# Patient Record
Sex: Female | Born: 1990 | Race: Black or African American | Hispanic: No | Marital: Single | Smoking: Former smoker
Health system: Southern US, Community
[De-identification: ages and names within clinical notes are randomized; demographics above are authoritative.]

## PROBLEM LIST (undated history)

## (undated) ENCOUNTER — Emergency Department (HOSPITAL_BASED_OUTPATIENT_CLINIC_OR_DEPARTMENT_OTHER): Admission: EM | Source: Home / Self Care

## (undated) ENCOUNTER — Emergency Department (HOSPITAL_BASED_OUTPATIENT_CLINIC_OR_DEPARTMENT_OTHER)

## (undated) DIAGNOSIS — L309 Dermatitis, unspecified: Secondary | ICD-10-CM

## (undated) DIAGNOSIS — G4700 Insomnia, unspecified: Secondary | ICD-10-CM

## (undated) DIAGNOSIS — E0590 Thyrotoxicosis, unspecified without thyrotoxic crisis or storm: Secondary | ICD-10-CM

## (undated) DIAGNOSIS — L509 Urticaria, unspecified: Secondary | ICD-10-CM

## (undated) DIAGNOSIS — R002 Palpitations: Secondary | ICD-10-CM

## (undated) DIAGNOSIS — D649 Anemia, unspecified: Secondary | ICD-10-CM

## (undated) DIAGNOSIS — J069 Acute upper respiratory infection, unspecified: Secondary | ICD-10-CM

## (undated) DIAGNOSIS — E039 Hypothyroidism, unspecified: Secondary | ICD-10-CM

## (undated) DIAGNOSIS — J45909 Unspecified asthma, uncomplicated: Secondary | ICD-10-CM

## (undated) DIAGNOSIS — J40 Bronchitis, not specified as acute or chronic: Secondary | ICD-10-CM

## (undated) DIAGNOSIS — F988 Other specified behavioral and emotional disorders with onset usually occurring in childhood and adolescence: Secondary | ICD-10-CM

## (undated) DIAGNOSIS — G43909 Migraine, unspecified, not intractable, without status migrainosus: Secondary | ICD-10-CM

## (undated) HISTORY — DX: Unspecified asthma, uncomplicated: J45.909

## (undated) HISTORY — PX: ADENOIDECTOMY: SUR15

## (undated) HISTORY — DX: Acute upper respiratory infection, unspecified: J06.9

## (undated) HISTORY — PX: SINOSCOPY: SHX187

## (undated) HISTORY — DX: Dermatitis, unspecified: L30.9

## (undated) HISTORY — DX: Insomnia, unspecified: G47.00

## (undated) HISTORY — DX: Hypothyroidism, unspecified: E03.9

## (undated) HISTORY — DX: Bronchitis, not specified as acute or chronic: J40

## (undated) HISTORY — PX: VAGINA RECONSTRUCTION SURGERY: SHX828

## (undated) HISTORY — DX: Migraine, unspecified, not intractable, without status migrainosus: G43.909

## (undated) HISTORY — DX: Urticaria, unspecified: L50.9

## (undated) HISTORY — DX: Other specified behavioral and emotional disorders with onset usually occurring in childhood and adolescence: F98.8

## (undated) HISTORY — PX: TONSILLECTOMY: SUR1361

---

## 2015-11-25 HISTORY — PX: NASAL SEPTOPLASTY W/ TURBINOPLASTY: SHX2070

## 2018-09-01 ENCOUNTER — Ambulatory Visit: Payer: Self-pay | Admitting: Allergy and Immunology

## 2018-09-01 ENCOUNTER — Encounter: Payer: Self-pay | Admitting: Allergy and Immunology

## 2018-09-01 ENCOUNTER — Ambulatory Visit (INDEPENDENT_AMBULATORY_CARE_PROVIDER_SITE_OTHER): Payer: Medicaid Other | Admitting: Allergy and Immunology

## 2018-09-01 VITALS — BP 104/68 | HR 86 | Temp 98.6°F | Resp 16 | Ht 67.5 in | Wt 120.0 lb

## 2018-09-01 DIAGNOSIS — J3089 Other allergic rhinitis: Secondary | ICD-10-CM | POA: Diagnosis not present

## 2018-09-01 DIAGNOSIS — H6983 Other specified disorders of Eustachian tube, bilateral: Secondary | ICD-10-CM | POA: Diagnosis not present

## 2018-09-01 DIAGNOSIS — G43909 Migraine, unspecified, not intractable, without status migrainosus: Secondary | ICD-10-CM

## 2018-09-01 DIAGNOSIS — R011 Cardiac murmur, unspecified: Secondary | ICD-10-CM

## 2018-09-01 DIAGNOSIS — R079 Chest pain, unspecified: Secondary | ICD-10-CM | POA: Diagnosis not present

## 2018-09-01 DIAGNOSIS — J4540 Moderate persistent asthma, uncomplicated: Secondary | ICD-10-CM

## 2018-09-01 DIAGNOSIS — K219 Gastro-esophageal reflux disease without esophagitis: Secondary | ICD-10-CM

## 2018-09-01 DIAGNOSIS — G479 Sleep disorder, unspecified: Secondary | ICD-10-CM

## 2018-09-01 MED ORDER — FLUTICASONE PROPIONATE 50 MCG/ACT NA SUSP: 1.0000 | Freq: Two times a day (BID) | NASAL | 5 refills | Status: DC

## 2018-09-01 MED ORDER — OMEPRAZOLE 40 MG PO CPDR: 40.0000 mg | DELAYED_RELEASE_CAPSULE | Freq: Every day | ORAL | 5 refills | Status: DC

## 2018-09-01 MED ORDER — CYPROHEPTADINE HCL 4 MG PO TABS: ORAL_TABLET | ORAL | 5 refills | Status: DC

## 2018-09-01 NOTE — Patient Instructions (Addendum)
  1.  Allergen avoidance measures  2.  Treat and prevent inflammation:   A.  Continue Symbicort 160 - 2 inhalations twice a day  B.  Flonase - 1 spray each nostril twice a day  C.  Remain away from any tobacco smoke exposure  3.  Treat and prevent reflux:   A.  Minimize any caffeine or chocolate consumption  B.  OTC omeprazole 40 mg - 1 tablet 1 time per day  4.  Treat and prevent insomnia and headaches;   A.  Periactin 4 mg tablet - 1/2 to 1 tablet 1 time at bedtime  5.  If needed:   A.  Albuterol MDI 2 inhalations every 4-6 hours  B.  Nasal saline  C.  Cetirizine 10 mg - 1 tablet 1 time per day  6.  Is Topamax helping you at all?  Is this medication necessary?  Is gabapentin causing a memory issue?  7.  Review recent chest x-ray and blood test results  8.  Further evaluation for murmur?  9.  Return to clinic in 3 weeks or earlier if problem  10.  Plan for fall flu vaccine

## 2018-09-01 NOTE — Progress Notes (Signed)
Dear Dr. Marina Goodell,  Thank you for referring Carla [name] to the [facility] [place] on 09/01/2018.   Below is a summation of this patient's evaluation and recommendations.  Thank you for your referral. I will keep you informed about this patient's response to treatment.   If you have any questions please do not hesitate to contact me.   Sincerely,  Jessica Priest, MD Allergy / Immunology [facility]   ______________________________________________________________________    NEW PATIENT NOTE  Referring Provider: Abigail Miyamoto,* Primary Provider: Abigail Miyamoto, MD Date of office visit: 09/01/2018    Subjective:   Chief Complaint:  Carla [name] (DOB: 10/01/1991) is a 27 y.o. female who presents to the clinic on 09/01/2018 with a chief complaint of Mold Allergy .     HPI: Carla [name] presents to this clinic in evaluation of many different issues.  First, she has been having a cough.  It sounds as though she started to develop respiratory tract symptoms in 2016 which she believes was secondary to mold exposure in a mold infested house which she left in 2017.  Even though that exposure has been eliminated she continues to cough and she has developed a rather significant cough for the past 10 months which has really gotten bad in the past 4 months.  In addition she has developed issues with chest tightness and having pain in her chest.  She describes 2 types of pains.  One pain is located at the lower ribs and the other is in the sternal area.  She has the lower rib pain every day.  She has the sternal chest pain which is very sharp a few times a week.  When she exercises she gets very out of breath.  Not only does she get out of breath she gets a little bit lightheaded and actually develops some dizziness and her chest starts to hurt.  There is no obvious provoking factors  giving rise to this issue.  She was recently started on Symbicort by a local pulmonologist about a month ago as well as Zyrtec and this may have resolved some of her cough but really did not help any of her other issues.  Second, she has constant throat clearing and something stuck in her throat and intermittent raspy voice.  She has a dry patch in her throat.  This has also been a persistent issue for many years.  She does not really note much symptomatology consistent with classic reflux.  Occasionally she will have mid chest burning if she eats the wrong food which is just a few times a month.  Third, she does have issues with nasal congestion and postnasal drip and nose blowing on occasion.  She has had septoplasty and turbinate reduction performed by an ENT doctor about 2 years ago which may have helped her somewhat but she still continues to have these persistent symptoms.  She states that she is treated with antibiotics about every other month and has been receiving this frequency of antibiotics since 2016.  It does not appear as though any nasal steroids or nasal antihistamines or other medications has help this issue very much at all.  Once again there is no obvious provoking factor giving rise to this issue.  Fourth, she does have issues with ear pain.  She will have intermittent ear pain mostly involving the right but occasionally left ear.  She will  get intermittent ringing as well.  Apparently her evaluation with ENT did not identify any significant abnormality giving rise to this issue.  Fifth, she has daily headaches.  She wakes up with a headache and she goes to bed with a headache.  Her headache is located in the bridge of her nose in the back of her head and her temples.  It is mostly just a dull ache but occasionally can assume a pounding quality and is not associated with any scotoma.  She has seen a neurologist in the past and she has been placed on Topamax for the past several years  which has not altered her headache intensity or frequency at home.  Apparently she also has restless leg syndrome and was given gabapentin which she uses every night.  She had some type of sleep study performed by the neurologist which apparently identified primary insomnia.  Sixth, she has problems with sleep.  She has very bad insomnia and basically sleeps 4 hours per night and has very fractured sleep during those 4 hours.  She has been given trazodone in the past but this makes her headaches worse.  She does not really drink any caffeine.  Seventh, she did smoke up until 2 weeks ago.  She smoked from the age of [age] and then restarted about 4-1/2 years ago at a rate of 1 to 2 packs/week.  Eighth, she has been having problems with memory.  She had to drop out of college because of this issue.  Normally she was a 4.0 student but over the course of the past year or so she has had problems with memory.  It should be noted that this memory issue does appear to correlate with the use of her gabapentin for her restless leg syndrome.  Ninth, she has lost about 30 pounds of weight in the past year or so without a voluntary effort.  She has stabilized at her current weight of 120 pounds for the past several months.  She has a few other medical issues that are active such as right neck pain and spine pain for which she takes Naprosyn every day.  Past Medical History:  Diagnosis Date  . ADD (attention deficit disorder)   . Asthma   . Bronchitis   . Eczema   . Hypothyroidism   . Insomnia   . Migraine   . Recurrent upper respiratory infection (URI)   . Urticaria     Past Surgical History:  Procedure Laterality Date  . ADENOIDECTOMY    . NASAL SEPTOPLASTY W/ TURBINOPLASTY  2017  . SINOSCOPY    . TONSILLECTOMY      Allergies as of 09/01/2018      Reactions   Latex Hives      Medication List      albuterol 108 (90 Base) MCG/ACT inhaler Commonly known as:  PROVENTIL HFA;VENTOLIN  HFA Inhale 2 puffs into the lungs every 6 (six) hours as needed for wheezing or shortness of breath.   budesonide-formoterol 160-4.5 MCG/ACT inhaler Commonly known as:  SYMBICORT Inhale 2 puffs into the lungs 2 (two) times daily.   cetirizine 10 MG tablet Commonly known as:  ZYRTEC Take 10 mg by mouth daily.   gabapentin 600 MG tablet Commonly known as:  NEURONTIN Take 600 mg by mouth daily.   tizanidine 6 MG capsule Commonly known as:  ZANAFLEX Take 6 mg by mouth daily as needed for muscle spasms.   topiramate 50 MG tablet Commonly known as:  TOPAMAX Take  50 mg by mouth daily.       Review of systems negative except as noted in HPI / PMHx or noted below:  Review of Systems  Constitutional: Negative.   HENT: Negative.   Eyes: Negative.   Respiratory: Negative.   Cardiovascular: Negative.   Gastrointestinal: Negative.   Genitourinary: Negative.   Musculoskeletal: Negative.   Skin: Negative.   Neurological: Negative.   Endo/Heme/Allergies: Negative.   Psychiatric/Behavioral: Negative.     Family History  Problem Relation Age of Onset  . Hypertension Mother   . Hypertension Father     Social History   Socioeconomic History  . Marital status: Single    Spouse name: Not on file  . Number of children: Not on file  . Years of education: Not on file  . Highest education level: Not on file  Occupational History  . Not on file  Social Needs  . Financial resource strain: Not on file  . Food insecurity:    Worry: Not on file    Inability: Not on file  . Transportation needs:    Medical: Not on file    Non-medical: Not on file  Tobacco Use  . Smoking status: Former Smoker    Packs/day: 0.25    Years: 4.00    Pack years: 1.00    Types: Cigarettes    Last attempt to quit: 08/18/2018    Years since quitting: 0.0  . Smokeless tobacco: Never Used  . Tobacco comment: patient quit 2 weeks ago. 09-01-18  Substance and Sexual Activity  . Alcohol use: Never     Frequency: Never  . Drug use: Never  . Sexual activity: Not on file  Lifestyle  . Physical activity:    Days per week: Not on file    Minutes per session: Not on file  . Stress: Not on file  Relationships  . Social connections:    Talks on phone: Not on file    Gets together: Not on file    Attends religious service: Not on file    Active member of club or organization: Not on file    Attends meetings of clubs or organizations: Not on file    Relationship status: Not on file  . Intimate partner violence:    Fear of current or ex partner: Not on file    Emotionally abused: Not on file    Physically abused: Not on file    Forced sexual activity: Not on file  Other Topics Concern  . Not on file  Social History Narrative  . Not on file    Environmental and Social history  Lives in a apartment with a dry environment, a dog located inside the household, carpet in the bedroom, plastic on the bed, no plastic on the pillow, no smokers located inside the household employment as a Pharmacologist.    Objective:   Vitals:   09/01/18 1423  BP: 104/68  Pulse: 86  Resp: 16  Temp: 98.6 F (37 C)  SpO2: 99%   Height: 5' 7.5" (171.5 cm) Weight: 120 lb (54.4 kg)  Physical Exam  HENT:  Head: Normocephalic. Head is without right periorbital erythema and without left periorbital erythema.  Right Ear: Tympanic membrane, external ear and ear canal normal.  Left Ear: Tympanic membrane, external ear and ear canal normal.  Nose: Nose normal. No mucosal edema or rhinorrhea.  Mouth/Throat: Oropharynx is clear and moist and mucous membranes are normal. No oropharyngeal exudate.  Eyes: Pupils are equal, round,  and reactive to light. Conjunctivae and lids are normal.  Neck: Trachea normal. No tracheal deviation present. No thyromegaly present.  Cardiovascular: Normal rate, regular rhythm, S1 normal and S2 normal.  Murmur (Systolic) heard. Pulmonary/Chest: Effort normal. No stridor. No  respiratory distress. She has no wheezes. She has no rales. She exhibits no tenderness.  Abdominal: Soft. She exhibits no distension and no mass. There is no hepatosplenomegaly. There is no tenderness. There is no rebound and no guarding.  Musculoskeletal: She exhibits no edema or tenderness.  Lymphadenopathy:       Head (right side): No tonsillar adenopathy present.       Head (left side): No tonsillar adenopathy present.    She has no cervical adenopathy.    She has no axillary adenopathy.  Neurological: She is alert.  Skin: No rash noted. She is not diaphoretic. No erythema. No pallor. Nails show no clubbing.    Diagnostics: Allergy skin tests were performed.  She demonstrated hypersensitivity to house dust mite, tree pollen, and mold.  Spirometry was performed and demonstrated an FEV1 of 2.54 @ 81 % of predicted. FEV1/FVC = 0.74.  Following administration of nebulized albuterol her FEV1 rose to 2.92 which was an increase in the FEV1 of 15%.  Oxygen saturation was 98% on room air at rest.  Oxygen saturation at room air walking up and down the hallway was 97%  Assessment and Plan:    1. Not well controlled moderate persistent asthma   2. Perennial allergic rhinitis   3. ETD (Eustachian tube dysfunction), bilateral   4. Chest pain in adult   5. LPRD (laryngopharyngeal reflux disease)   6. Systolic murmur   7. Migraine syndrome   8. Sleep disorder     1.  Allergen avoidance measures  2.  Treat and prevent inflammation:   A.  Continue Symbicort 160 - 2 inhalations twice a day  B.  Flonase - 1 spray each nostril twice a day  C.  Remain away from any tobacco smoke exposure  3.  Treat and prevent reflux:   A.  Minimize any caffeine or chocolate consumption  B.  OTC omeprazole 40 mg - 1 tablet 1 time per day  4.  Treat and prevent insomnia and headaches;   A.  Periactin 4 mg tablet - 1/2 to 1 tablet 1 time at bedtime  5.  If needed:   A.  Albuterol MDI 2 inhalations every  4-6 hours  B.  Nasal saline  C.  Cetirizine 10 mg - 1 tablet 1 time per day  6.  Is Topamax helping you at all?  Is this medication necessary?  Is gabapentin causing a memory issue?  7.  Review recent chest x-ray and blood test results  8.  Further evaluation for murmur?  9.  Return to clinic in 3 weeks or earlier if problem  10.  Plan for fall flu vaccine  There is a lot going on with Carla [name].  All of her respiratory tract symptoms and chest pain may be tied up with her atopic disease and reflux disease and hopefully she will respond to the therapy noted above to address these issues.  However, we need to consider other cardiopulmonary abnormalities and immune abnormalities that may be contributing to her issue.  She has a body habitus very consistent with Marfan's and the presence of her murmur may be a manifestation of this condition.  I think it would be best for her to obtain a echocardiogram and investigation of  her murmur but without any insurance it is going to be very difficult for her to find the financial resources needed to perform this study.  Apparently she will be starting a new job soon and as soon as she does get insurance we can order a echocardiogram.  Baxter Hire also has headaches and sleep dysfunction.  I have started her on Periactin as a preventative for both headaches and also to treat her insomnia.  I question whether or not Topamax is an indicated medication at this point it as it has not really helped her very much at all regarding prevention of headaches.  As well, she appears to have developed a memory issue that does correlate with the use of gabapentin and I question whether or not gabapentin should be utilized if it is responsible for this side effect.  We will readdress all of these issues when she returns to this clinic in 3 weeks or earlier if there is a problem.  Jessica Priest, MD Allergy / Immunology [facility] [place]

## 2018-09-02 ENCOUNTER — Encounter: Payer: Self-pay | Admitting: Allergy and Immunology

## 2018-09-02 NOTE — Addendum Note (Signed)
Addended by: Jessica Priest on: 09/02/2018 03:48 PM   Modules accepted: Level of Service

## 2018-09-03 ENCOUNTER — Encounter: Payer: Self-pay | Admitting: Allergy and Immunology

## 2018-11-04 DIAGNOSIS — R0602 Shortness of breath: Secondary | ICD-10-CM

## 2019-11-23 DIAGNOSIS — N898 Other specified noninflammatory disorders of vagina: Secondary | ICD-10-CM | POA: Insufficient documentation

## 2020-08-08 ENCOUNTER — Encounter (HOSPITAL_COMMUNITY): Payer: Self-pay | Admitting: *Deleted

## 2020-08-08 ENCOUNTER — Emergency Department (HOSPITAL_COMMUNITY)
Admission: EM | Admit: 2020-08-08 | Discharge: 2020-08-08 | Disposition: A | Discharge status: Home / Self Care | Payer: Medicaid Other | Attending: Emergency Medicine | Admitting: Emergency Medicine

## 2020-08-08 ENCOUNTER — Other Ambulatory Visit: Payer: Self-pay

## 2020-08-08 DIAGNOSIS — R55 Syncope and collapse: Secondary | ICD-10-CM | POA: Diagnosis not present

## 2020-08-08 DIAGNOSIS — Z5321 Procedure and treatment not carried out due to patient leaving prior to being seen by health care provider: Secondary | ICD-10-CM | POA: Diagnosis not present

## 2020-08-08 DIAGNOSIS — W19XXXA Unspecified fall, initial encounter: Secondary | ICD-10-CM | POA: Diagnosis not present

## 2020-08-08 DIAGNOSIS — R519 Headache, unspecified: Secondary | ICD-10-CM | POA: Diagnosis not present

## 2020-08-08 DIAGNOSIS — R110 Nausea: Secondary | ICD-10-CM | POA: Diagnosis present

## 2020-08-08 NOTE — ED Notes (Signed)
Pt called for recheck vitals, no response

## 2020-08-08 NOTE — ED Triage Notes (Signed)
Pt reports having syncopal episode two weeks ago, she fell and hit her head at that time. Her dr sent her here due to still having a headache and nausea since hitting her head. No distress is noted at triage.

## 2020-08-08 NOTE — ED Notes (Signed)
Called and no response

## 2020-09-19 ENCOUNTER — Encounter: Payer: Self-pay | Admitting: *Deleted

## 2020-09-19 ENCOUNTER — Other Ambulatory Visit: Payer: Self-pay | Admitting: *Deleted

## 2020-09-19 DIAGNOSIS — R55 Syncope and collapse: Secondary | ICD-10-CM

## 2020-09-19 NOTE — Progress Notes (Signed)
Patient ID: Carla [name], female   DOB: [birth year], 29 y.o.   MRN: [id number] Patient enrolled for [employer] to ship a 30 day cardiac event monitor to her home.

## 2020-09-26 ENCOUNTER — Encounter (INDEPENDENT_AMBULATORY_CARE_PROVIDER_SITE_OTHER): Payer: Medicaid Other

## 2020-09-26 DIAGNOSIS — R55 Syncope and collapse: Secondary | ICD-10-CM | POA: Diagnosis not present

## 2020-11-14 ENCOUNTER — Ambulatory Visit: Payer: Medicaid Other | Admitting: Family Medicine

## 2020-11-24 NOTE — L&D Delivery Note (Signed)
Delivery Note At 5:35 PM a viable and healthy female was delivered via Vaginal, Spontaneous (Presentation:  LOA    ).  APGAR: 9, 9; weight 5 lb 1.3 oz (2305 g).   Placenta status: Spontaneous, Intact.  Cord: 3 vessels with the following complications: None.    Anesthesia: None Episiotomy: None Lacerations: None Suture Repair:  n/a Est. Blood Loss (mL): 50  Mom to postpartum.  Baby to Couplet care / Skin to Skin.   Carla [name] is a 30 y.o. female [phone] with IUP at [redacted]w[redacted]d admitted for IOL for DFM and FGR .  She progressed with augmentation to complete and pushed less than 10 minutes to deliver.  Cord clamping delayed by 1-3 minutes then clamped by CNM and cut by FOB.  Placenta intact and spontaneous, bleeding minimal.  Intact perineum.  Mom and baby stable prior to transfer to postpartum. She plans on breastfeeding. She requests POPs for birth control.   Sharen Counter 07/04/2021, 7:25 PM

## 2020-11-26 ENCOUNTER — Telehealth: Payer: Self-pay

## 2020-11-26 ENCOUNTER — Inpatient Hospital Stay (HOSPITAL_COMMUNITY)
Admission: AD | Admit: 2020-11-26 | Discharge: 2020-11-26 | Disposition: A | Discharge status: Home / Self Care | Payer: Medicaid Other | Attending: Family Medicine | Admitting: Family Medicine

## 2020-11-26 ENCOUNTER — Encounter (HOSPITAL_COMMUNITY): Payer: Self-pay | Admitting: Family Medicine

## 2020-11-26 ENCOUNTER — Inpatient Hospital Stay (HOSPITAL_COMMUNITY): Payer: Medicaid Other

## 2020-11-26 ENCOUNTER — Other Ambulatory Visit: Payer: Self-pay

## 2020-11-26 DIAGNOSIS — Z87891 Personal history of nicotine dependence: Secondary | ICD-10-CM | POA: Insufficient documentation

## 2020-11-26 DIAGNOSIS — Z3A01 Less than 8 weeks gestation of pregnancy: Secondary | ICD-10-CM | POA: Diagnosis not present

## 2020-11-26 DIAGNOSIS — O99611 Diseases of the digestive system complicating pregnancy, first trimester: Secondary | ICD-10-CM | POA: Diagnosis not present

## 2020-11-26 DIAGNOSIS — R1032 Left lower quadrant pain: Secondary | ICD-10-CM | POA: Diagnosis present

## 2020-11-26 DIAGNOSIS — O26891 Other specified pregnancy related conditions, first trimester: Secondary | ICD-10-CM | POA: Insufficient documentation

## 2020-11-26 DIAGNOSIS — R109 Unspecified abdominal pain: Secondary | ICD-10-CM | POA: Diagnosis not present

## 2020-11-26 DIAGNOSIS — O26899 Other specified pregnancy related conditions, unspecified trimester: Secondary | ICD-10-CM

## 2020-11-26 DIAGNOSIS — K5900 Constipation, unspecified: Secondary | ICD-10-CM

## 2020-11-26 DIAGNOSIS — Z3490 Encounter for supervision of normal pregnancy, unspecified, unspecified trimester: Secondary | ICD-10-CM

## 2020-11-26 LAB — COMPREHENSIVE METABOLIC PANEL
ALT: 15 U/L (ref 0–44)
AST: 18 U/L (ref 15–41)
Albumin: 3.6 g/dL (ref 3.5–5.0)
Alkaline Phosphatase: 42 U/L (ref 38–126)
Anion gap: 10 (ref 5–15)
BUN: 8 mg/dL (ref 6–20)
CO2: 24 mmol/L (ref 22–32)
Calcium: 9.5 mg/dL (ref 8.9–10.3)
Chloride: 103 mmol/L (ref 98–111)
Creatinine, Ser: 0.73 mg/dL (ref 0.44–1.00)
GFR, Estimated: >60 mL/min (ref >60)
Glucose, Bld: 98 mg/dL (ref 70–99)
Potassium: 4.8 mmol/L (ref 3.5–5.1)
Sodium: 137 mmol/L (ref 135–145)
Total Bilirubin: 0.7 mg/dL (ref 0.3–1.2)
Total Protein: 6.2 g/dL — ABNORMAL LOW (ref 6.5–8.1)

## 2020-11-26 LAB — URINALYSIS, ROUTINE W REFLEX MICROSCOPIC
Bilirubin Urine: NEGATIVE
Glucose, UA: NEGATIVE mg/dL
Hgb urine dipstick: NEGATIVE
Ketones, ur: NEGATIVE mg/dL
Leukocytes,Ua: NEGATIVE
Nitrite: NEGATIVE
Protein, ur: NEGATIVE mg/dL
Specific Gravity, Urine: 1.005 (ref 1.005–1.030)
pH: 7 (ref 5.0–8.0)

## 2020-11-26 LAB — CBC WITH DIFFERENTIAL/PLATELET
Abs Immature Granulocytes: 0.02 10*3/uL (ref 0.00–0.07)
Basophils Absolute: 0 10*3/uL (ref 0.0–0.1)
Basophils Relative: 1 %
Eosinophils Absolute: 0.1 10*3/uL (ref 0.0–0.5)
Eosinophils Relative: 1 %
HCT: 34.1 % — ABNORMAL LOW (ref 36.0–46.0)
Hemoglobin: 11.6 g/dL — ABNORMAL LOW (ref 12.0–15.0)
Immature Granulocytes: 0 %
Lymphocytes Relative: 35 %
Lymphs Abs: 1.7 10*3/uL (ref 0.7–4.0)
MCH: 29.8 pg (ref 26.0–34.0)
MCHC: 34 g/dL (ref 30.0–36.0)
MCV: 87.7 fL (ref 80.0–100.0)
Monocytes Absolute: 0.5 10*3/uL (ref 0.1–1.0)
Monocytes Relative: 10 %
Neutro Abs: 2.6 10*3/uL (ref 1.7–7.7)
Neutrophils Relative %: 53 %
Platelets: 236 10*3/uL (ref 150–400)
RBC: 3.89 MIL/uL (ref 3.87–5.11)
RDW: 13.9 % (ref 11.5–15.5)
WBC: 4.9 10*3/uL (ref 4.0–10.5)
nRBC: 0 % (ref 0.0–0.2)

## 2020-11-26 LAB — HIV ANTIBODY (ROUTINE TESTING W REFLEX): HIV Screen 4th Generation wRfx: NONREACTIVE

## 2020-11-26 LAB — ABO/RH: ABO/RH(D): O POS

## 2020-11-26 LAB — WET PREP, GENITAL
Clue Cells Wet Prep HPF POC: NONE SEEN
Sperm: NONE SEEN
Trich, Wet Prep: NONE SEEN
Yeast Wet Prep HPF POC: NONE SEEN

## 2020-11-26 LAB — POCT PREGNANCY, URINE: Preg Test, Ur: POSITIVE — AB

## 2020-11-26 LAB — HCG, QUANTITATIVE, PREGNANCY: hCG, Beta Chain, Quant, S: 12214 m[IU]/mL — ABNORMAL HIGH (ref <5)

## 2020-11-26 NOTE — MAU Note (Signed)
Started pain in LL pelvis last night. Has been constipated for 2 days. Tried metamucil, having trouble keeping it down.  Doesn't know how far a long she is, irreg cycles.  First +HPT 2nd wk in Dec.

## 2020-11-26 NOTE — Telephone Encounter (Signed)
Pt called stating she is having some sharp stabbing pains and constipation. Pt does not know how far along she is in pregnancy and she denies vaginal bleeding. Pt made aware that she can take metamucil or try some colace and to increase her water intake. I advised pt to go to [facility] if the pain gets worse and if she starts bleeding heavy like a period. Understanding was voiced. Carla [name] l Daniya Aramburo, CMA

## 2020-11-26 NOTE — MAU Provider Note (Signed)
History     CSN: [id number]  Arrival date and time: 11/26/20 1316   Event Date/Time   First Provider Initiated Contact with Patient 11/26/20 1406      Chief Complaint  Patient presents with  . Pelvic Pain  . Possible Pregnancy  . Constipation   HPI     Ms.Carla [name] is a 30 y.o.female [id number] unknown gestation presents to MAU with complaints of LLQ pain, lower back pain, ? Constipation, N/V.  Reports having constipation since she found out she was pregnant in December. She had a normal BM 3 days ago and nothing since. Reports she occasionally has the urge to use the bathroom without relief. She reports the pain comes and goes. She has tried tylenol which has not helped. She denies bleeding. Occasional N/v which she associates with early pregnancy. She reports normal diet and able to tolerate/ keep down oral fluids.   OB History    Gravida  5   Para  2   Term  2   Preterm      AB  2   Living  2     SAB  1   IAB  1   Ectopic      Multiple      Live Births  2           Past Medical History:  Diagnosis Date  . ADD (attention deficit disorder)   . Asthma   . Bronchitis   . Eczema   . Hypothyroidism   . Insomnia   . Migraine   . Recurrent upper respiratory infection (URI)   . Urticaria     Past Surgical History:  Procedure Laterality Date  . ADENOIDECTOMY    . NASAL SEPTOPLASTY W/ TURBINOPLASTY  2017  . SINOSCOPY    . TONSILLECTOMY      Family History  Problem Relation Age of Onset  . Hypertension Mother   . Hypertension Father     Social History   Tobacco Use  . Smoking status: Former Smoker    Packs/day: 0.25    Years: 4.00    Pack years: 1.00    Types: Cigarettes    Quit date: 08/18/2018    Years since quitting: 2.2  . Smokeless tobacco: Never Used  . Tobacco comment: patient quit 2 weeks ago. 09-01-18  Vaping Use  . Vaping Use: Never used  Substance Use Topics  . Alcohol use: Never  . Drug use: Never     Allergies:  Allergies  Allergen Reactions  . Latex Hives    Medications Prior to Admission  Medication Sig Dispense Refill Last Dose  . Ascorbic Acid (VITAMIN C) 1000 MG tablet Take 1,000 mg by mouth daily.   11/26/2020 at Unknown time  . cholecalciferol (VITAMIN D3) 25 MCG (1000 UNIT) tablet Take 1,000 Units by mouth daily.   11/26/2020 at Unknown time  . Prenatal Vit-Fe Fumarate-FA (PRENATAL MULTIVITAMIN) TABS tablet Take 1 tablet by mouth daily at 12 noon.   11/26/2020 at Unknown time  . albuterol (PROVENTIL HFA;VENTOLIN HFA) 108 (90 Base) MCG/ACT inhaler Inhale 2 puffs into the lungs every 6 (six) hours as needed for wheezing or shortness of breath.   More than a month at Unknown time  . budesonide-formoterol (SYMBICORT) 160-4.5 MCG/ACT inhaler Inhale 2 puffs into the lungs 2 (two) times daily.   More than a month at Unknown time  . cetirizine (ZYRTEC) 10 MG tablet Take 10 mg by mouth daily.   More than a month  at Unknown time  . cyproheptadine (PERIACTIN) 4 MG tablet 0.5-1 tablet once at bedtime. 30 tablet 5   . fluticasone (FLONASE) 50 MCG/ACT nasal spray Place 1 spray into both nostrils 2 (two) times daily. 16 g 5   . gabapentin (NEURONTIN) 600 MG tablet Take 600 mg by mouth daily.   More than a month at Unknown time  . omeprazole (PRILOSEC) 40 MG capsule Take 1 capsule (40 mg total) by mouth daily. 30 capsule 5   . tizanidine (ZANAFLEX) 6 MG capsule Take 6 mg by mouth daily as needed for muscle spasms.     Marland Kitchen topiramate (TOPAMAX) 50 MG tablet Take 50 mg by mouth daily.   More than a month at Unknown time   Results for orders placed or performed during the hospital encounter of 11/26/20 (from the past 48 hour(s))  Urinalysis, Routine w reflex microscopic     Status: Abnormal   Collection Time: 11/26/20  1:37 PM  Result Value Ref Range   Color, Urine STRAW (A) YELLOW   APPearance CLEAR CLEAR   Specific Gravity, Urine 1.005 1.005 - 1.030   pH 7.0 5.0 - 8.0   Glucose, UA NEGATIVE  NEGATIVE mg/dL   Hgb urine dipstick NEGATIVE NEGATIVE   Bilirubin Urine NEGATIVE NEGATIVE   Ketones, ur NEGATIVE NEGATIVE mg/dL   Protein, ur NEGATIVE NEGATIVE mg/dL   Nitrite NEGATIVE NEGATIVE   Leukocytes,Ua NEGATIVE NEGATIVE    Comment: Performed at [facility], [address], [place], [place] [address]  Pregnancy, urine POC     Status: Abnormal   Collection Time: 11/26/20  1:42 PM  Result Value Ref Range   Preg Test, Ur POSITIVE (A) NEGATIVE    Comment:        THE SENSITIVITY OF THIS METHODOLOGY IS >24 mIU/mL   CBC with Differential/Platelet     Status: Abnormal   Collection Time: 11/26/20  2:26 PM  Result Value Ref Range   WBC 4.9 4.0 - 10.5 K/uL   RBC 3.89 3.87 - 5.11 MIL/uL   Hemoglobin 11.6 (L) 12.0 - 15.0 g/dL   HCT 50.5 (L) 39.7 - 67.3 %   MCV 87.7 80.0 - 100.0 fL   MCH 29.8 26.0 - 34.0 pg   MCHC 34.0 30.0 - 36.0 g/dL   RDW 41.9 37.9 - 02.4 %   Platelets 236 150 - 400 K/uL   nRBC 0.0 0.0 - 0.2 %   Neutrophils Relative % 53 %   Neutro Abs 2.6 1.7 - 7.7 K/uL   Lymphocytes Relative 35 %   Lymphs Abs 1.7 0.7 - 4.0 K/uL   Monocytes Relative 10 %   Monocytes Absolute 0.5 0.1 - 1.0 K/uL   Eosinophils Relative 1 %   Eosinophils Absolute 0.1 0.0 - 0.5 K/uL   Basophils Relative 1 %   Basophils Absolute 0.0 0.0 - 0.1 K/uL   Immature Granulocytes 0 %   Abs Immature Granulocytes 0.02 0.00 - 0.07 K/uL    Comment: Performed at [facility], [address], [place], [place] [address]  Comprehensive metabolic panel     Status: Abnormal   Collection Time: 11/26/20  2:26 PM  Result Value Ref Range   Sodium 137 135 - 145 mmol/L   Potassium 4.8 3.5 - 5.1 mmol/L   Chloride 103 98 - 111 mmol/L   CO2 24 22 - 32 mmol/L   Glucose, Bld 98 70 - 99 mg/dL    Comment: Glucose reference range applies only to samples  taken after fasting for at least 8 hours.   BUN 8 6 - 20 mg/dL   Creatinine, Ser 0.73 0.44 - 1.00 mg/dL   Calcium 9.5 8.9 - 10.3 mg/dL   Total Protein 6.2  (L) 6.5 - 8.1 g/dL   Albumin 3.6 3.5 - 5.0 g/dL   AST 18 15 - 41 U/L   ALT 15 0 - 44 U/L   Alkaline Phosphatase 42 38 - 126 U/L   Total Bilirubin 0.7 0.3 - 1.2 mg/dL   GFR, Estimated >60 >60 mL/min    Comment: (NOTE) Calculated using the CKD-EPI Creatinine Equation (2021)    Anion gap 10 5 - 15    Comment: Performed at [facility] [address], [place], [place] [address]  ABO/Rh     Status: None   Collection Time: 11/26/20  2:26 PM  Result Value Ref Range   ABO/RH(D) O POS    No rh immune globuloin      NOT A RH IMMUNE GLOBULIN CANDIDATE, PT RH POSITIVE Performed at [facility] [address] [place], [place] [address]   Wet prep, genital     Status: Abnormal   Collection Time: 11/26/20  2:30 PM   Specimen: Vaginal  Result Value Ref Range   Yeast Wet Prep HPF POC NONE SEEN NONE SEEN   Trich, Wet Prep NONE SEEN NONE SEEN   Clue Cells Wet Prep HPF POC NONE SEEN NONE SEEN   WBC, Wet Prep HPF POC FEW (A) NONE SEEN   Sperm NONE SEEN     Comment: Performed at [facility], [place] [address] [place], [place] [address]   US OB LESS THAN 14 WEEKS WITH OB TRANSVAGINAL  Result Date: 11/26/2020 CLINICAL DATA:  First trimester of pregnancy, left lower quadrant abdominal pain. EXAM: OBSTETRIC <14 WK [place] AND TRANSVAGINAL OB US TECHNIQUE: Both transabdominal and transvaginal ultrasound examinations were performed for complete evaluation of the gestation as well as the maternal uterus, adnexal regions, and pelvic cul-de-sac. Transvaginal technique was performed to assess early pregnancy. COMPARISON:  None. FINDINGS: Intrauterine gestational sac: Single Yolk sac:  Visualized. Embryo:  Not Visualized. Cardiac Activity: Not Visualized. MSD: 8.8 mm   5 w   4 d Subchorionic hemorrhage:  None visualized. Maternal uterus/adnexae: Ovaries are unremarkable. Small amount of free fluid is noted which most likely is physiologic. IMPRESSION: Probable early intrauterine gestational sac with  yolk sac, but no fetal pole or cardiac activity yet visualized. Recommend follow-up quantitative B-HCG levels and follow-up US in 14 days to assess viability. This recommendation follows SRU consensus guidelines: Diagnostic Criteria for Nonviable Pregnancy Early in the First Trimester. [employer] Med 2013; 903:0092-33. Electronically Signed   By: Marijo Conception M.D.   On: 11/26/2020 15:14   Review of Systems  Constitutional: Negative for fever.  Gastrointestinal: Positive for constipation, nausea and vomiting.  Genitourinary: Negative for vaginal bleeding and vaginal discharge.   Physical Exam   Blood pressure 113/73, pulse 83, temperature 98.7 F (37.1 C), temperature source Oral, resp. rate 17, height 5\' 9"  (1.753 m), weight 67.1 kg, SpO2 100 %, unknown if currently breastfeeding.  Physical Exam Vitals reviewed.  Constitutional:      General: She is not in acute distress.    Appearance: Normal appearance.  Pulmonary:     Effort: Pulmonary effort is normal.  Abdominal:     General: Bowel sounds are normal. There is no distension.     Palpations: Abdomen is  soft.     Tenderness: There is abdominal tenderness in the epigastric area and periumbilical area. There is no guarding or rebound.     Hernia: No hernia is present.  Musculoskeletal:        General: Normal range of motion.  Skin:    General: Skin is warm.  Neurological:     Mental Status: She is alert and oriented to person, place, and time.  Psychiatric:        Behavior: Behavior normal.    MAU Course  Procedures  None  MDM  O positive blood type  Wet prep & GC HIV, CBC, Hcg, ABO US OB transvaginal  Discussed [place] in detail with patient; discussed OTC medications for constipation. Offered enema here, patient declined, would like to perform at home.   Assessment and Plan   1. Pregnancy, unspecified gestational age   76. Abdominal pain affecting pregnancy   3. Constipation during pregnancy in first trimester      P:  Discharge home in stable condition List of OTC medications provided Continue oral hydration  Start prenatal care.  Return to MAU if symptoms worsen  Zeah Germano, Harolyn Rutherford, NP 11/26/2020 3:52 PM

## 2020-11-26 NOTE — MAU Note (Signed)
Pt uncomfortable when laying flat and tender in LLQ.

## 2020-11-26 NOTE — Discharge Instructions (Signed)
Safe Medications in Pregnancy  ? ? ?Acne: ?Benzoyl Peroxide ?Salicylic Acid ? ?Backache/Headache: ?Tylenol: 2 regular strength every 4 hours OR ?             2 Extra strength every 6 hours ? ?Colds/Coughs/Allergies: ?Benadryl (alcohol free) 25 mg every 6 hours as needed ?Breath right strips ?Claritin ?Cepacol throat lozenges ?Chloraseptic throat spray ?Cold-Eeze- up to three times per day ?Cough drops, alcohol free ?Flonase (by prescription only) ?Guaifenesin ?Mucinex ?Robitussin DM (plain only, alcohol free) ?Saline nasal spray/drops ?Sudafed (pseudoephedrine) & Actifed ** use only after [redacted] weeks gestation and if you do not have high blood pressure ?Tylenol ?Vicks Vaporub ?Zinc lozenges ?Zyrtec  ? ?Constipation: ?Colace ?Ducolax suppositories ?Fleet enema ?Glycerin suppositories ?Metamucil ?Milk of magnesia ?Miralax ?Senokot ?Smooth move tea ? ?Diarrhea: ?Kaopectate ?Imodium A-D ? ?*NO pepto Bismol ? ?Hemorrhoids: ?Anusol ?Anusol HC ?Preparation H ?Tucks ? ?Indigestion: ?Tums ?Maalox ?Mylanta ?Zantac  ?Pepcid ? ?Insomnia: ?Benadryl (alcohol free) 25mg every 6 hours as needed ?Tylenol PM ?Unisom, no Gelcaps ? ?Leg Cramps: ?Tums ?MagGel ? ?Nausea/Vomiting:  ?Bonine ?Dramamine ?Emetrol ?Ginger extract ?Sea bands ?Meclizine  ?Nausea medication to take during pregnancy:  ?Unisom (doxylamine succinate 25 mg tablets) Take one tablet daily at bedtime. If symptoms are not adequately controlled, the dose can be increased to a maximum recommended dose of two tablets daily (1/2 tablet in the morning, 1/2 tablet mid-afternoon and one at bedtime). ?Vitamin B6 100mg tablets. Take one tablet twice a day (up to 200 mg per day). ? ?Skin Rashes: ?Aveeno products ?Benadryl cream or 25mg every 6 hours as needed ?Calamine Lotion ?1% cortisone cream ? ?Yeast infection: ?Gyne-lotrimin 7 ?Monistat 7 ? ? ?**If taking multiple medications, please check labels to avoid duplicating the same active ingredients ?**take  medication as directed on the label ?** Do not exceed 4000 mg of tylenol in 24 hours ?**Do not take medications that contain aspirin or ibuprofen ? ? ? ?You have constipation which is hard stools that are difficult to pass. It is important to have regular bowel movements every 1-3 days that are soft and easy to pass. Hard stools increase your risk of hemorrhoids and are very uncomfortable.  ? ?To prevent constipation you can increase the amount of fiber in your diet. Examples of foods with fiber are leafy greens, whole grain breads, oatmeal and other grains.  It is also important to drink at least eight 8oz glass of water everyday.  ? ?If you have not has a bowel movement in 4-5 days you made need to clean out your bowel.  This will have establish normal movement through your bowel.   ? ?Miralax Clean out ?Take 8 capfuls of miralax in 64 oz of gatorade. You can use any fluid that appeals to you (gatorade, water, juice) ?Continue to drink at least eight 8 oz glasses of water throughout the day ?You can repeat with another 8 capfuls of miralax in 64 oz of gatorade if you are not having a large amount of stools ?You will need to be at home and close to a bathroom for about 8 hours when you do the above as you may need to go to the bathroom frequently.  ? ?After you are cleaned out: ?- Start Colace100mg twice daily ?- Start Miralax once daily ?- Start a daily fiber supplement like metamucil or citrucel ?- You can safely use enemas in pregnancy  ?- if you are having diarrhea you can reduce to Colace once a day or miralax every other   day or a 1/2 capful daily.  ? ?

## 2020-11-27 LAB — GC/CHLAMYDIA PROBE AMP (~~LOC~~) NOT AT ARMC
Chlamydia: NEGATIVE
Comment: NEGATIVE
Comment: NORMAL
Neisseria Gonorrhea: NEGATIVE

## 2020-12-13 ENCOUNTER — Encounter (HOSPITAL_COMMUNITY): Payer: Self-pay | Admitting: Obstetrics and Gynecology

## 2020-12-13 ENCOUNTER — Inpatient Hospital Stay (HOSPITAL_COMMUNITY)
Admission: AD | Admit: 2020-12-13 | Discharge: 2020-12-13 | Disposition: A | Discharge status: Home / Self Care | Payer: Medicaid Other | Attending: Obstetrics and Gynecology | Admitting: Obstetrics and Gynecology

## 2020-12-13 ENCOUNTER — Other Ambulatory Visit: Payer: Self-pay

## 2020-12-13 DIAGNOSIS — Z87891 Personal history of nicotine dependence: Secondary | ICD-10-CM | POA: Diagnosis not present

## 2020-12-13 DIAGNOSIS — O210 Mild hyperemesis gravidarum: Secondary | ICD-10-CM | POA: Insufficient documentation

## 2020-12-13 DIAGNOSIS — Z3A08 8 weeks gestation of pregnancy: Secondary | ICD-10-CM | POA: Diagnosis not present

## 2020-12-13 DIAGNOSIS — O99891 Other specified diseases and conditions complicating pregnancy: Secondary | ICD-10-CM | POA: Insufficient documentation

## 2020-12-13 DIAGNOSIS — K5901 Slow transit constipation: Secondary | ICD-10-CM | POA: Diagnosis present

## 2020-12-13 DIAGNOSIS — M5412 Radiculopathy, cervical region: Secondary | ICD-10-CM | POA: Diagnosis not present

## 2020-12-13 DIAGNOSIS — R109 Unspecified abdominal pain: Secondary | ICD-10-CM | POA: Diagnosis not present

## 2020-12-13 DIAGNOSIS — K5900 Constipation, unspecified: Secondary | ICD-10-CM | POA: Diagnosis present

## 2020-12-13 HISTORY — DX: Thyrotoxicosis, unspecified without thyrotoxic crisis or storm: E05.90

## 2020-12-13 LAB — URINALYSIS, ROUTINE W REFLEX MICROSCOPIC
Bilirubin Urine: NEGATIVE
Glucose, UA: NEGATIVE mg/dL
Hgb urine dipstick: NEGATIVE
Ketones, ur: NEGATIVE mg/dL
Leukocytes,Ua: NEGATIVE
Nitrite: NEGATIVE
Protein, ur: NEGATIVE mg/dL
Specific Gravity, Urine: 1.018 (ref 1.005–1.030)
pH: 8 (ref 5.0–8.0)

## 2020-12-13 LAB — CBC
HCT: 31.9 % — ABNORMAL LOW (ref 36.0–46.0)
Hemoglobin: 10.9 g/dL — ABNORMAL LOW (ref 12.0–15.0)
MCH: 30 pg (ref 26.0–34.0)
MCHC: 34.2 g/dL (ref 30.0–36.0)
MCV: 87.9 fL (ref 80.0–100.0)
Platelets: 207 10*3/uL (ref 150–400)
RBC: 3.63 MIL/uL — ABNORMAL LOW (ref 3.87–5.11)
RDW: 13.6 % (ref 11.5–15.5)
WBC: 5.2 10*3/uL (ref 4.0–10.5)
nRBC: 0 % (ref 0.0–0.2)

## 2020-12-13 LAB — COMPREHENSIVE METABOLIC PANEL
ALT: 13 U/L (ref 0–44)
AST: 16 U/L (ref 15–41)
Albumin: 3.6 g/dL (ref 3.5–5.0)
Alkaline Phosphatase: 33 U/L — ABNORMAL LOW (ref 38–126)
Anion gap: 8 (ref 5–15)
BUN: <5 mg/dL — ABNORMAL LOW (ref 6–20)
CO2: 24 mmol/L (ref 22–32)
Calcium: 9.1 mg/dL (ref 8.9–10.3)
Chloride: 107 mmol/L (ref 98–111)
Creatinine, Ser: 0.61 mg/dL (ref 0.44–1.00)
GFR, Estimated: >60 mL/min (ref >60)
Glucose, Bld: 86 mg/dL (ref 70–99)
Potassium: 4.6 mmol/L (ref 3.5–5.1)
Sodium: 139 mmol/L (ref 135–145)
Total Bilirubin: 0.8 mg/dL (ref 0.3–1.2)
Total Protein: 6.3 g/dL — ABNORMAL LOW (ref 6.5–8.1)

## 2020-12-13 LAB — HCG, QUANTITATIVE, PREGNANCY: hCG, Beta Chain, Quant, S: 352810 m[IU]/mL — ABNORMAL HIGH (ref <5)

## 2020-12-13 MED ORDER — PROMETHAZINE HCL 25 MG/ML IJ SOLN
12.5000 mg | Freq: Once | INTRAMUSCULAR | Status: AC
  Administered 2020-12-13: 12.5 mg via INTRAVENOUS
  Filled 2020-12-13: qty 1

## 2020-12-13 MED ORDER — PREDNISONE 50 MG PO TABS: 50.0000 mg | ORAL_TABLET | Freq: Every day | ORAL | 0 refills | Status: AC

## 2020-12-13 MED ORDER — MILK AND MOLASSES ENEMA
1.0000 | Freq: Once | RECTAL | Status: AC
  Administered 2020-12-13: 240 mL via RECTAL
  Filled 2020-12-13: qty 240

## 2020-12-13 MED ORDER — ONDANSETRON 4 MG PO TBDP: 4.0000 mg | ORAL_TABLET | Freq: Three times a day (TID) | ORAL | 0 refills | Status: DC | PRN

## 2020-12-13 MED ORDER — PROMETHAZINE HCL 12.5 MG PO TABS: 12.5000 mg | ORAL_TABLET | Freq: Four times a day (QID) | ORAL | 0 refills | Status: DC | PRN

## 2020-12-13 MED ORDER — LACTATED RINGERS IV BOLUS
1000.0000 mL | Freq: Once | INTRAVENOUS | Status: AC
  Administered 2020-12-13: 1000 mL via INTRAVENOUS

## 2020-12-13 MED ORDER — PREDNISONE 50 MG PO TABS: 50.0000 mg | ORAL_TABLET | Freq: Every day | ORAL | 0 refills | Status: DC

## 2020-12-13 MED ORDER — MINERAL OIL RE ENEM
1.0000 | ENEMA | Freq: Once | RECTAL | Status: AC
  Administered 2020-12-13: 1 via RECTAL
  Filled 2020-12-13: qty 1

## 2020-12-13 MED ORDER — ONDANSETRON HCL 4 MG/2ML IJ SOLN
4.0000 mg | Freq: Once | INTRAMUSCULAR | Status: AC
  Administered 2020-12-13: 4 mg via INTRAVENOUS
  Filled 2020-12-13: qty 2

## 2020-12-13 NOTE — MAU Provider Note (Signed)
History     [id number]  Arrival date and time: 12/13/20 1010    Chief Complaint  Patient presents with  . Constipation     HPI Carla [name] is a 30 y.o. at [redacted]w[redacted]d by week ultrasound, who presents for abdominal pain and constipation.   Review of discharge summary from last admission on 11/26/2020: Seen in the MAU on January 3, presented with left lower quadrant pain, back pain and constipation.  Found to have early IUP without fetal pole, O+ blood type, offered enema but declined.  Discharged home in stable condition.  Patient reports she has had abdominal pain since she was last seen in MAU and reports no bowel movement since 11/28/2020. Pain is primarily in the LLQ, constant. Also associated with some n/v, has had trouble maintaining PO and last had something to eat or drink yesterday. She denies any blood or emesis, diarrhea, burning or pain with urination, vaginal bleeding, vaginal discharge. She believes her symptoms are due to constipation and she has tried colace and miralax without effect. She has not yet tried an enema.   Also notes that starting this morning at work had sudden intense electric like pain that starts in her neck and radiates down her R arm. She also feels like her R arm has pins and needles in it. Very painful to turn her neck to the R and she gets an electrical shock down her arm when that happens. She has not noticed any appreciable weakness. She has an aunt with MS, no other personal or family hx of neurological disease.    --/--/O POS (01/03 1426)  OB History    Gravida  5   Para  2   Term  2   Preterm      AB  2   Living  2     SAB  1   IAB  1   Ectopic      Multiple      Live Births  2           Past Medical History:  Diagnosis Date  . ADD (attention deficit disorder)   . Asthma   . Bronchitis   . Eczema   . Hyperthyroidism   . Insomnia   . Migraine   . Recurrent upper respiratory infection (URI)   . Urticaria      Past Surgical History:  Procedure Laterality Date  . ADENOIDECTOMY    . NASAL SEPTOPLASTY W/ TURBINOPLASTY  2017  . SINOSCOPY    . TONSILLECTOMY      Family History  Problem Relation Age of Onset  . Hypertension Mother   . Hypertension Father     Social History   Socioeconomic History  . Marital status: Single    Spouse name: Not on file  . Number of children: Not on file  . Years of education: Not on file  . Highest education level: Not on file  Occupational History  . Not on file  Tobacco Use  . Smoking status: Former Smoker    Packs/day: 0.25    Years: 4.00    Pack years: 1.00    Types: Cigarettes    Quit date: 08/18/2018    Years since quitting: 2.3  . Smokeless tobacco: Never Used  . Tobacco comment: patient quit 2 weeks ago. 09-01-18  Vaping Use  . Vaping Use: Never used  Substance and Sexual Activity  . Alcohol use: Never  . Drug use: Never  . Sexual activity: Yes  Other  Topics Concern  . Not on file  Social History Narrative  . Not on file   Social Determinants of Health   Financial Resource Strain: Not on file  Food Insecurity: Not on file  Transportation Needs: Not on file  Physical Activity: Not on file  Stress: Not on file  Social Connections: Not on file  Intimate Partner Violence: Not on file    Allergies  Allergen Reactions  . Latex Hives    No current facility-administered medications on file prior to encounter.   Current Outpatient Medications on File Prior to Encounter  Medication Sig Dispense Refill  . albuterol (PROVENTIL HFA;VENTOLIN HFA) 108 (90 Base) MCG/ACT inhaler Inhale 2 puffs into the lungs every 6 (six) hours as needed for wheezing or shortness of breath.    . Ascorbic Acid (VITAMIN C) 1000 MG tablet Take 1,000 mg by mouth daily.    . budesonide-formoterol (SYMBICORT) 160-4.5 MCG/ACT inhaler Inhale 2 puffs into the lungs 2 (two) times daily.    . cholecalciferol (VITAMIN D3) 25 MCG (1000 UNIT) tablet Take 1,000 Units  by mouth daily.    . Prenatal Vit-Fe Fumarate-FA (PRENATAL MULTIVITAMIN) TABS tablet Take 1 tablet by mouth daily at 12 noon.    . cetirizine (ZYRTEC) 10 MG tablet Take 10 mg by mouth daily.    . fluticasone (FLONASE) 50 MCG/ACT nasal spray Place 1 spray into both nostrils 2 (two) times daily. 16 g 5  . gabapentin (NEURONTIN) 600 MG tablet Take 600 mg by mouth daily.    Marland Kitchen. omeprazole (PRILOSEC) 40 MG capsule Take 1 capsule (40 mg total) by mouth daily. 30 capsule 5     ROS Pertinent positives and negative per HPI, all others reviewed and negative  Physical Exam   BP 114/62   Pulse 73   Temp 98.9 F (37.2 C)   Resp 18   Ht 5\' 9"  (1.753 m)   Wt 66.7 kg   LMP  (LMP Unknown) Comment: reports irreg cycles, ? LMP sometime in Oct, spotted ~11/3  BMI 21.71 kg/m   Patient Vitals for the past 24 hrs:  BP Temp Pulse Resp Height Weight  12/13/20 1050 114/62 98.9 F (37.2 C) 73 18 5\' 9"  (1.753 m) 66.7 kg    Physical Exam Vitals reviewed.  Constitutional:      General: She is not in acute distress.    Appearance: She is well-developed and well-nourished. She is not diaphoretic.  Eyes:     General: No scleral icterus. Pulmonary:     Effort: Pulmonary effort is normal. No respiratory distress.  Abdominal:     General: There is no distension.     Palpations: Abdomen is soft.     Tenderness: There is no abdominal tenderness. There is no guarding or rebound.  Musculoskeletal:        General: No edema.  Skin:    General: Skin is warm and dry.  Neurological:     Mental Status: She is alert.     Coordination: Coordination normal.     Comments: Strength 5/5 in bilateral upper extremities with the exception of R grip strength which was 4/5. Reflexes 2+ in RUE. Negative Hoffman sign.   Psychiatric:        Mood and Affect: Mood and affect normal.      Cervical Exam    Bedside Ultrasound Pt informed that the ultrasound is considered a limited OB ultrasound and is not intended to be a  complete ultrasound exam.  Patient also informed that  the ultrasound is not being completed with the intent of assessing for fetal or placental anomalies or any pelvic abnormalities.  Explained that the purpose of today's ultrasound is to assess for  viability.  Patient acknowledges the purpose of the exam and the limitations of the study.    My interpretation: viable IUP seen, FHR 132 bpm  FHT FHR 132 bpm  Labs Results for orders placed or performed during the hospital encounter of 12/13/20 (from the past 24 hour(s))  Urinalysis, Routine w reflex microscopic Urine, Clean Catch     Status: Abnormal   Collection Time: 12/13/20 11:22 AM  Result Value Ref Range   Color, Urine YELLOW YELLOW   APPearance HAZY (A) CLEAR   Specific Gravity, Urine 1.018 1.005 - 1.030   pH 8.0 5.0 - 8.0   Glucose, UA NEGATIVE NEGATIVE mg/dL   Hgb urine dipstick NEGATIVE NEGATIVE   Bilirubin Urine NEGATIVE NEGATIVE   Ketones, ur NEGATIVE NEGATIVE mg/dL   Protein, ur NEGATIVE NEGATIVE mg/dL   Nitrite NEGATIVE NEGATIVE   Leukocytes,Ua NEGATIVE NEGATIVE  Comprehensive metabolic panel     Status: Abnormal   Collection Time: 12/13/20 12:39 PM  Result Value Ref Range   Sodium 139 135 - 145 mmol/L   Potassium 4.6 3.5 - 5.1 mmol/L   Chloride 107 98 - 111 mmol/L   CO2 24 22 - 32 mmol/L   Glucose, Bld 86 70 - 99 mg/dL   BUN <5 (L) 6 - 20 mg/dL   Creatinine, Ser 1.61 0.44 - 1.00 mg/dL   Calcium 9.1 8.9 - 09.6 mg/dL   Total Protein 6.3 (L) 6.5 - 8.1 g/dL   Albumin 3.6 3.5 - 5.0 g/dL   AST 16 15 - 41 U/L   ALT 13 0 - 44 U/L   Alkaline Phosphatase 33 (L) 38 - 126 U/L   Total Bilirubin 0.8 0.3 - 1.2 mg/dL   GFR, Estimated >04 >54 mL/min   Anion gap 8 5 - 15  CBC     Status: Abnormal   Collection Time: 12/13/20 12:39 PM  Result Value Ref Range   WBC 5.2 4.0 - 10.5 K/uL   RBC 3.63 (L) 3.87 - 5.11 MIL/uL   Hemoglobin 10.9 (L) 12.0 - 15.0 g/dL   HCT 09.8 (L) 11.9 - 14.7 %   MCV 87.9 80.0 - 100.0 fL   MCH 30.0  26.0 - 34.0 pg   MCHC 34.2 30.0 - 36.0 g/dL   RDW 82.9 56.2 - 13.0 %   Platelets 207 150 - 400 K/uL   nRBC 0.0 0.0 - 0.2 %  hCG, quantitative, pregnancy     Status: Abnormal   Collection Time: 12/13/20 12:39 PM  Result Value Ref Range   hCG, Beta Chain, Quant, S 352,810 (H) <5 mIU/mL    Imaging No results found.  MAU Course  Procedures  Lab Orders     Urinalysis, Routine w reflex microscopic Urine, Clean Catch     Comprehensive metabolic panel     CBC     hCG, quantitative, pregnancy Meds ordered this encounter  Medications  . mineral oil enema 1 enema  . lactated ringers bolus 1,000 mL  . ondansetron (ZOFRAN) injection 4 mg  . promethazine (PHENERGAN) injection 12.5 mg  . milk and molasses enema  . predniSONE (DELTASONE) 50 MG tablet    Sig: Take 1 tablet (50 mg total) by mouth daily for 5 days.    Dispense:  5 tablet    Refill:  0  .  promethazine (PHENERGAN) 12.5 MG tablet    Sig: Take 1 tablet (12.5 mg total) by mouth every 6 (six) hours as needed for nausea or vomiting.    Dispense:  30 tablet    Refill:  0  . ondansetron (ZOFRAN ODT) 4 MG disintegrating tablet    Sig: Take 1 tablet (4 mg total) by mouth every 8 (eight) hours as needed for nausea or vomiting.    Dispense:  20 tablet    Refill:  0   Imaging Orders  No imaging studies ordered today    MDM moderate  Assessment and Plan  #Abdominal pain, 1st trimester of pregnancy #Nausea and vomiting, 1st trimester of pregnancy Bedside US demonstrated viable IUP. No BM since 11/28/2020, given enema x1 of mineral oil without effect. Subsequently did rectal exam to assess for impaction with no stool palpated, +stool on glove. Given second enema of milk and molasses with large BM and resolution of abdominal pain. Instructed to increase miralax to BID, d/c colace as studies show absolutely no benefit for constipation, and will send her nausea medicine as her OTC treatment has not been effective.   #R arm  paresthesias C/w cervical radiculopathy, mild paresthesias and mild motor symptoms in hand present (see physical exam). Discussed w on call Neurology, low suspicion for serious process and no need for imaging at this time unless worsens significantly. NSAIDs contraindicated but will send her with steroid burst. Counseled that if symptoms worsen dramatically needs to return to ED for MRI.   #FWB FHR 132 bpm  Discharged to home in stable condition.   Venora Maples, MD/MPH 12/13/20 6:06 PM  Allergies as of 12/13/2020      Reactions   Latex Hives      Medication List    STOP taking these medications   gabapentin 600 MG tablet Commonly known as: NEURONTIN     TAKE these medications   albuterol 108 (90 Base) MCG/ACT inhaler Commonly known as: VENTOLIN HFA Inhale 2 puffs into the lungs every 6 (six) hours as needed for wheezing or shortness of breath.   budesonide-formoterol 160-4.5 MCG/ACT inhaler Commonly known as: SYMBICORT Inhale 2 puffs into the lungs 2 (two) times daily.   cetirizine 10 MG tablet Commonly known as: ZYRTEC Take 10 mg by mouth daily.   cholecalciferol 25 MCG (1000 UNIT) tablet Commonly known as: VITAMIN D3 Take 1,000 Units by mouth daily.   fluticasone 50 MCG/ACT nasal spray Commonly known as: FLONASE Place 1 spray into both nostrils 2 (two) times daily.   omeprazole 40 MG capsule Commonly known as: PRILOSEC Take 1 capsule (40 mg total) by mouth daily.   ondansetron 4 MG disintegrating tablet Commonly known as: Zofran ODT Take 1 tablet (4 mg total) by mouth every 8 (eight) hours as needed for nausea or vomiting.   predniSONE 50 MG tablet Commonly known as: DELTASONE Take 1 tablet (50 mg total) by mouth daily for 5 days.   prenatal multivitamin Tabs tablet Take 1 tablet by mouth daily at 12 noon.   promethazine 12.5 MG tablet Commonly known as: PHENERGAN Take 1 tablet (12.5 mg total) by mouth every 6 (six) hours as needed for nausea or  vomiting.   vitamin C 1000 MG tablet Take 1,000 mg by mouth daily.

## 2020-12-13 NOTE — MAU Note (Signed)
Pt here on 1/3 for the same thing.Pt c/o abd pain on LLQ nd constipation. Had tried colace an fleet enema without much results. Has ben having n/v along with the constipation. Feeling dizzy at times.

## 2020-12-19 ENCOUNTER — Telehealth: Payer: Self-pay

## 2020-12-19 NOTE — Telephone Encounter (Signed)
Patient called stating that she is pregnant and has New OB visit scheduled for Monday AM. Patient states she has been constipated and has not had a BM in a week.  Patient is currently taking cap full of Miralax BID.  Patient also complaining of nausea and vomiting. Patient states she was given zofran and phenergan and has not has any relief at home from these.  Patient has been seen in MAU twice since the begning of the pregnancy. Last visit they performed 2 enemas on patient. Patient advise to seek care at MAU since she has not had BM in 1 week and zofran and phenergan not helping her vomiting. Patient states she is vomiting daily.  Patient advised to go to MAU  Now for evaluation.  Armandina Stammer RN

## 2020-12-19 NOTE — Telephone Encounter (Signed)
-----   Message from Marti Sleigh, [place] sent at 12/19/2020  4:28 PM EST ----- Regarding: constipation Patient is scheduled for NOB on Monday, 12/24/2020.  She hasn't been able to have a BM and would like to know what she can take.

## 2020-12-24 ENCOUNTER — Encounter: Payer: Self-pay | Admitting: Obstetrics & Gynecology

## 2020-12-24 ENCOUNTER — Other Ambulatory Visit: Payer: Self-pay

## 2020-12-24 ENCOUNTER — Ambulatory Visit (INDEPENDENT_AMBULATORY_CARE_PROVIDER_SITE_OTHER): Payer: Medicaid Other | Admitting: Obstetrics & Gynecology

## 2020-12-24 ENCOUNTER — Other Ambulatory Visit (HOSPITAL_COMMUNITY)
Admission: RE | Admit: 2020-12-24 | Discharge: 2020-12-24 | Disposition: A | Discharge status: Home / Self Care | Payer: Medicaid Other | Source: Ambulatory Visit | Attending: Obstetrics & Gynecology | Admitting: Obstetrics & Gynecology

## 2020-12-24 VITALS — BP 113/64 | HR 72 | Wt 144.0 lb

## 2020-12-24 DIAGNOSIS — E0590 Thyrotoxicosis, unspecified without thyrotoxic crisis or storm: Secondary | ICD-10-CM

## 2020-12-24 DIAGNOSIS — O99280 Endocrine, nutritional and metabolic diseases complicating pregnancy, unspecified trimester: Secondary | ICD-10-CM

## 2020-12-24 DIAGNOSIS — Z3480 Encounter for supervision of other normal pregnancy, unspecified trimester: Secondary | ICD-10-CM | POA: Diagnosis not present

## 2020-12-24 DIAGNOSIS — O0993 Supervision of high risk pregnancy, unspecified, third trimester: Secondary | ICD-10-CM | POA: Insufficient documentation

## 2020-12-24 DIAGNOSIS — O219 Vomiting of pregnancy, unspecified: Secondary | ICD-10-CM

## 2020-12-24 DIAGNOSIS — O99619 Diseases of the digestive system complicating pregnancy, unspecified trimester: Secondary | ICD-10-CM

## 2020-12-24 DIAGNOSIS — K5900 Constipation, unspecified: Secondary | ICD-10-CM

## 2020-12-24 MED ORDER — DOXYLAMINE-PYRIDOXINE 10-10 MG PO TBEC: 2.0000 | DELAYED_RELEASE_TABLET | Freq: Every day | ORAL | 5 refills | Status: DC

## 2020-12-24 MED ORDER — DOXYLAMINE-PYRIDOXINE 10-10 MG PO TBEC: 1.0000 | DELAYED_RELEASE_TABLET | Freq: Two times a day (BID) | ORAL | 2 refills | Status: DC

## 2020-12-24 NOTE — Progress Notes (Signed)
Patient complaining of nausea that is not relieved by phenergan and constipation. Carla Stammer  RN   DATING AND VIABILITY SONOGRAM   Kayna Suppa Lorayne Marek is a 30 y.o. year old [phone] with LMP No LMP recorded (lmp unknown). Patient is pregnant. which would correlate to  [redacted]w[redacted]d weeks gestation.  She has regular menstrual cycles.   She is here today for a confirmatory initial sonogram.    GESTATION: SINGLETON     FETAL ACTIVITY:          Heart rate       154 bpm          The fetus is active.    GESTATIONAL AGE AND  BIOMETRICS:  Gestational criteria: Estimated Date of Delivery: 07/25/21 by early ultrasound now at [redacted]w[redacted]d  Previous Scans:1      CROWN RUMP LENGTH          2.32 cm         9-0weeks                                                                               AVERAGE EGA(BY THIS SCAN)9-0 weeks  WORKING EDD( early ultrasound ):  07-25-2021     TECHNICIAN COMMENTS: Patient informed that the ultrasound is considered a limited obstetric ultrasound and is not intended to be a complete ultrasound exam. Patient also informed that the ultrasound is not being completed with the intent of assessing for fetal or placental anomalies or any pelvic abnormalities. Explained that the purpose of today's ultrasound is to assess for fetal heart rate. Patient acknowledges the purpose of the exam and the limitations of the study.     Carla [name] 12/24/2020 10:09 AM

## 2020-12-24 NOTE — Addendum Note (Signed)
Addended by: Anell Barr on: 12/24/2020 03:04 PM   Modules accepted: Orders

## 2020-12-24 NOTE — Progress Notes (Signed)
  Subjective:    Carla [name] is being seen today for her first obstetrical visit.[id number] LMP very ligth Nov 24 or 25.   This is a planned pregnancy. She is at [redacted]w[redacted]d gestation. Her obstetrical history is significant for excessive nausea and emesis that she has never pad prev. . Relationship with FOB: significant other, not living together. Patient does intend to breast feed. Pregnancy history fully reviewed.  Patient reports nausea, vomiting and constipation. She had a milk and molasses enema in the hops that helped with her sx.   Review of Systems:   Review of Systems: [place]  Objective:     BP 113/64   Pulse 72   Wt 144 lb (65.3 kg)   LMP  (LMP Unknown) Comment: reports irreg cycles, ? LMP sometime in Oct, spotted ~11/3  BMI 21.27 kg/m  Physical Exam  Exam General Appearance:    Alert, cooperative, no distress, appears stated age  Head:    Normocephalic, without obvious abnormality, atraumatic  Eyes:    conjunctiva/corneas clear, EOM's intact, both eyes  Ears:    Normal external ear canals, both ears  Nose:   Nares normal, septum midline, mucosa normal, no drainage    or sinus tenderness  Throat:   Lips, mucosa, and tongue normal; teeth and gums normal  Neck:   Supple, symmetrical, trachea midline, no adenopathy;    thyroid:  no enlargement/tenderness/nodules  Back:     Symmetric, no curvature, ROM normal, no CVA tenderness  Lungs:     respirations unlabored  Chest Wall:    No tenderness or deformity   Heart:    Regular rate and rhythm  Breast Exam:    No tenderness, masses, or nipple abnormality  Abdomen:     Soft, non-tender, bowel sounds active all four quadrants,    no masses, no organomegaly  Genitalia:    Normal female without lesion, discharge or tenderness   Uterine size 11 weeks sized  Extremities:   Extremities normal, atraumatic, no cyanosis or edema  Pulses:   2+ and symmetric all extremities  Skin:   Skin color, texture, turgor normal, no rashes or lesions      Assessment:    Pregnancy: [id number] Patient Active Problem List   Diagnosis Date Noted  . Supervision of other normal pregnancy, antepartum 12/24/2020  . Cervical radiculopathy 12/13/2020  . Slow transit constipation 12/13/2020       Plan:     Initial labs drawn. Prenatal vitamins. Problem list reviewed and updated. AFP3 discussed: requested. Role of ultrasound in pregnancy discussed; fetal survey: requested. Amniocentesis discussed: not indicated. Follow up in 4 weeks. 60% of 30 min visit spent on counseling and coordination of care.  TSH with PNL Miralax to increase dosage based on results. For today 4 capfull tid until clear.  Diclegis rx sent to pharmacy.   Ruthanna Macchia L. Erin Fulling, M.D., [facility] 12/24/2020

## 2020-12-25 LAB — CBC/D/PLT+RPR+RH+ABO+RUB AB...
Antibody Screen: NEGATIVE
Basophils Absolute: 0 10*3/uL (ref 0.0–0.2)
Basos: 0 %
EOS (ABSOLUTE): 0.1 10*3/uL (ref 0.0–0.4)
Eos: 1 %
HCV Ab: <0.1 s/co ratio (ref 0.0–0.9)
HIV Screen 4th Generation wRfx: NONREACTIVE
Hematocrit: 32.6 % — ABNORMAL LOW (ref 34.0–46.6)
Hemoglobin: 11.3 g/dL (ref 11.1–15.9)
Hepatitis B Surface Ag: NEGATIVE
Immature Grans (Abs): 0 10*3/uL (ref 0.0–0.1)
Immature Granulocytes: 0 %
Lymphocytes Absolute: 3.1 10*3/uL (ref 0.7–3.1)
Lymphs: 39 %
MCH: 29.7 pg (ref 26.6–33.0)
MCHC: 34.7 g/dL (ref 31.5–35.7)
MCV: 86 fL (ref 79–97)
Monocytes Absolute: 0.7 10*3/uL (ref 0.1–0.9)
Monocytes: 8 %
Neutrophils Absolute: 4.1 10*3/uL (ref 1.4–7.0)
Neutrophils: 52 %
Platelets: 240 10*3/uL (ref 150–450)
RBC: 3.8 x10E6/uL (ref 3.77–5.28)
RDW: 13.1 % (ref 11.7–15.4)
RPR Ser Ql: NONREACTIVE
Rh Factor: POSITIVE
Rubella Antibodies, IGG: 10.7 index (ref >0.99)
WBC: 7.9 10*3/uL (ref 3.4–10.8)

## 2020-12-25 LAB — HCV INTERPRETATION

## 2020-12-25 LAB — TSH: TSH: 0.012 u[IU]/mL — ABNORMAL LOW (ref 0.450–4.500)

## 2020-12-26 LAB — URINE CULTURE: Organism ID, Bacteria: NO GROWTH

## 2020-12-27 LAB — CYTOLOGY - PAP
Chlamydia: NEGATIVE
Comment: NEGATIVE
Comment: NEGATIVE
Comment: NORMAL
High risk HPV: NEGATIVE
Neisseria Gonorrhea: NEGATIVE

## 2020-12-28 ENCOUNTER — Telehealth: Payer: Self-pay

## 2020-12-28 NOTE — Telephone Encounter (Signed)
Patient called stating that she is taking the diclegis and it is not helping- just making her sleepy. She is still having nausea and vomiting. Sent message yesterday via my chart (fowarded to provider).  Patient also states she reviewed her labs and noted an abnormal pap smear and her TSH levels are off.  Patient would like Plan of care for both of these. Patient states she has hyperthyroid but her PCP has never done anything about it.    Patient admits on phone she has OCD but she is worried about not knowing a plan of care for the abnormal lab work. Will route to provider high priority. Armandina Stammer RN

## 2020-12-31 ENCOUNTER — Encounter: Payer: Self-pay | Admitting: Obstetrics & Gynecology

## 2020-12-31 ENCOUNTER — Telehealth: Payer: Self-pay | Admitting: Obstetrics & Gynecology

## 2020-12-31 DIAGNOSIS — O99019 Anemia complicating pregnancy, unspecified trimester: Secondary | ICD-10-CM | POA: Insufficient documentation

## 2020-12-31 DIAGNOSIS — R87619 Unspecified abnormal cytological findings in specimens from cervix uteri: Secondary | ICD-10-CM | POA: Insufficient documentation

## 2020-12-31 DIAGNOSIS — E0590 Thyrotoxicosis, unspecified without thyrotoxic crisis or storm: Secondary | ICD-10-CM | POA: Insufficient documentation

## 2020-12-31 DIAGNOSIS — O99280 Endocrine, nutritional and metabolic diseases complicating pregnancy, unspecified trimester: Secondary | ICD-10-CM | POA: Insufficient documentation

## 2020-12-31 NOTE — Telephone Encounter (Signed)
TC to pt. Pt reports that her sx have improved somewhat. The emesis has resolved and the nausea has improved somewhat. The Zofran and ginger combo have worked best.   I reviewed with pt her low TSH and the need for a full tyroid panel.   I have also reviewed her anemia and reviewed. Will begin Iron after n/v resolved.      Reviewed LGSIL on PAP. Pt has never had a prev abnormal PAP. Pt has a new sexual partner since her last normal PAP.  WE discussed this and pt will get a repeat PAP PP.   Carla [name], M.D., [facility]

## 2021-01-01 ENCOUNTER — Other Ambulatory Visit: Payer: Self-pay

## 2021-01-01 ENCOUNTER — Other Ambulatory Visit: Payer: Medicaid Other

## 2021-01-01 ENCOUNTER — Telehealth: Payer: Self-pay

## 2021-01-01 DIAGNOSIS — O99280 Endocrine, nutritional and metabolic diseases complicating pregnancy, unspecified trimester: Secondary | ICD-10-CM

## 2021-01-01 DIAGNOSIS — E0590 Thyrotoxicosis, unspecified without thyrotoxic crisis or storm: Secondary | ICD-10-CM

## 2021-01-01 NOTE — Progress Notes (Signed)
Patient sent to lab for blood draw.

## 2021-01-01 NOTE — Telephone Encounter (Signed)
Patient called and made aware that she needs thyroid panel drawn and hgb electrophorosis. Armandina Stammer RN

## 2021-01-01 NOTE — Telephone Encounter (Signed)
-----   Message from Willodean Rosenthal, MD sent at 12/31/2020 10:29 AM EST ----- Pt needs a full thyroid panel. Would you see if this can be added to her labs from 1/31?  It it cannot, would you call the pt in for repeat labs. I have also spoken to her and she is expecting a call and will come in if it cant be added.   Also, she is anemic. I dont see a hgb electrophoresis. If she doesn't have one, can we add this to her labs.   Thanks,   Clh-S

## 2021-01-02 ENCOUNTER — Other Ambulatory Visit: Payer: Medicaid Other

## 2021-01-03 LAB — HGB FRACTIONATION CASCADE
Hgb A2: 2.8 % (ref 1.8–3.2)
Hgb A: 97.2 % (ref 96.4–98.8)
Hgb F: 0 % (ref 0.0–2.0)
Hgb S: 0 %

## 2021-01-03 LAB — T4, FREE: Free T4: 1.66 ng/dL (ref 0.82–1.77)

## 2021-01-03 LAB — T3, FREE: T3, Free: 4.7 pg/mL — ABNORMAL HIGH (ref 2.0–4.4)

## 2021-01-15 ENCOUNTER — Telehealth: Payer: Self-pay

## 2021-01-15 NOTE — Telephone Encounter (Signed)
Pt called stating she is experiencing pain and numbness in her leg. Pt states she is also having back pain but isn't sure if she has ever had sciatic nerve pain. Pt states the pain makes it hard for her to walk. Advised pt to go to [facility] at [facility]. Understanding was voiced. Xaivier Malay l Meliah Appleman, CMA

## 2021-01-22 ENCOUNTER — Ambulatory Visit (INDEPENDENT_AMBULATORY_CARE_PROVIDER_SITE_OTHER): Payer: Medicaid Other | Admitting: Certified Nurse Midwife

## 2021-01-22 ENCOUNTER — Encounter: Payer: Self-pay | Admitting: Certified Nurse Midwife

## 2021-01-22 ENCOUNTER — Other Ambulatory Visit: Payer: Self-pay

## 2021-01-22 VITALS — BP 108/61 | HR 85 | Wt 148.0 lb

## 2021-01-22 DIAGNOSIS — R002 Palpitations: Secondary | ICD-10-CM

## 2021-01-22 DIAGNOSIS — O99280 Endocrine, nutritional and metabolic diseases complicating pregnancy, unspecified trimester: Secondary | ICD-10-CM

## 2021-01-22 DIAGNOSIS — Z3480 Encounter for supervision of other normal pregnancy, unspecified trimester: Secondary | ICD-10-CM

## 2021-01-22 DIAGNOSIS — Z8659 Personal history of other mental and behavioral disorders: Secondary | ICD-10-CM

## 2021-01-22 DIAGNOSIS — E0590 Thyrotoxicosis, unspecified without thyrotoxic crisis or storm: Secondary | ICD-10-CM

## 2021-01-22 DIAGNOSIS — Z3A13 13 weeks gestation of pregnancy: Secondary | ICD-10-CM

## 2021-01-22 MED ORDER — FERROUS SULFATE 325 (65 FE) MG PO TABS: 325.0000 mg | ORAL_TABLET | ORAL | 1 refills | Status: DC

## 2021-01-22 NOTE — Patient Instructions (Signed)

## 2021-01-22 NOTE — Progress Notes (Signed)
PRENATAL VISIT NOTE  Subjective:  Carla [name] is a 30 y.o. [phone] at 30w5dbeing seen today for ongoing prenatal care.  She is currently monitored for the following issues for this high-risk pregnancy and has Cervical radiculopathy; Slow transit constipation; Supervision of other normal pregnancy, antepartum; Hyperthyroidism affecting pregnancy, antepartum; Anemia, antepartum; and Abnormal Pap smear of cervix on their problem list.  Patient reports palpitations and SOB.  Contractions: Not present. Vag. Bleeding: Scant.  Movement: Absent. Denies leaking of fluid.   The following portions of the patient's history were reviewed and updated as appropriate: allergies, current medications, past family history, past medical history, past social history, past surgical history and problem list.   Objective:   Vitals:   01/22/21 0842  BP: 108/61  Pulse: 85  Weight: 148 lb (67.1 kg)    Fetal Status: Fetal Heart Rate (bpm): 150   Movement: Absent     General:  Alert, oriented and cooperative. Patient is in no acute distress.  Skin: Skin is warm and dry. No rash noted.   Cardiovascular: Normal heart rate noted  Respiratory: Normal respiratory effort, no problems with respiration noted  Abdomen: Soft, gravid, appropriate for gestational age.  Pain/Pressure: Present     Pelvic: Cervical exam deferred        Extremities: Normal range of motion.  Edema: Trace  Mental Status: Normal mood and affect. Normal behavior. Normal judgment and thought content.   Assessment and Plan:  Pregnancy: [id number] 118w5d. [redacted] weeks gestation of pregnancy - Genetic Screening - [place] OB COMP + 1459K; Future  2. Supervision of other normal pregnancy, antepartum - Patient reports that nausea and vomiting is better, mostly resolved- patient reports that she has been able to keep down food throughout the day  - Patient reports new onset of palpitations that leads to SOB for the past 3 weeks -  Anticipatory guidance on upcoming appointments  - ferrous sulfate (FERROUSUL) 325 (65 FE) MG tablet; Take 1 tablet (325 mg total) by mouth every other day.  Dispense: 60 tablet; Refill: 1  3. Hyperthyroidism affecting pregnancy, antepartum - Repeat labs each trimester  - T4, free - T3, free  4. Palpitations - Patient reports palpitations that lead to SOB, patient reports that she can be walking to bathroom, laying down, getting out of bed and have these symptoms  - Patient reports drinking 6-7 bottles of water per day and can go though 40 bottle case of water in 1 week  - Patient reports the only thing that makes it better is if she sits down and does not move  - labs obtained to check electrolyte imbalance d/t hx of nausea and vomiting - Comp Met (CMET) - CBC w/Diff - Magnesium - Ambulatory referral to Cardiology  5. History of depression - Patient reports having a hx of depression, denies taking any medication for depression  - patient denies worsening depression, discussed with patient JaRoselyn Reefr AnSeth Bakes options for patient to discuss coping mechanisms with social work. Patient declines at this time and will let usKoreanow if depression is worsening.    Preterm labor symptoms and general obstetric precautions including but not limited to vaginal bleeding, contractions, leaking of fluid and fetal movement were reviewed in detail with the patient. Please refer to After Visit Summary for other counseling recommendations.   Return in about 4 weeks (around 02/19/2021) for HROB, in person,AFP.  Future Appointments  Date Time Provider DeAges3/31/2022  9:00  AM Truett Mainland, DO [facility] None  02/28/2021 10:15 AM [facility] US2 [facility] [place]    Lajean Manes, CNM

## 2021-01-23 DIAGNOSIS — G43909 Migraine, unspecified, not intractable, without status migrainosus: Secondary | ICD-10-CM | POA: Insufficient documentation

## 2021-01-23 DIAGNOSIS — L509 Urticaria, unspecified: Secondary | ICD-10-CM | POA: Insufficient documentation

## 2021-01-23 DIAGNOSIS — J069 Acute upper respiratory infection, unspecified: Secondary | ICD-10-CM | POA: Insufficient documentation

## 2021-01-23 DIAGNOSIS — L309 Dermatitis, unspecified: Secondary | ICD-10-CM | POA: Insufficient documentation

## 2021-01-23 DIAGNOSIS — F988 Other specified behavioral and emotional disorders with onset usually occurring in childhood and adolescence: Secondary | ICD-10-CM | POA: Insufficient documentation

## 2021-01-23 DIAGNOSIS — E0590 Thyrotoxicosis, unspecified without thyrotoxic crisis or storm: Secondary | ICD-10-CM | POA: Insufficient documentation

## 2021-01-23 DIAGNOSIS — J40 Bronchitis, not specified as acute or chronic: Secondary | ICD-10-CM | POA: Insufficient documentation

## 2021-01-23 DIAGNOSIS — J45909 Unspecified asthma, uncomplicated: Secondary | ICD-10-CM | POA: Insufficient documentation

## 2021-01-23 DIAGNOSIS — G4700 Insomnia, unspecified: Secondary | ICD-10-CM | POA: Insufficient documentation

## 2021-01-23 LAB — COMPREHENSIVE METABOLIC PANEL
ALT: 12 IU/L (ref 0–32)
AST: 17 IU/L (ref 0–40)
Albumin/Globulin Ratio: 1.9 (ref 1.2–2.2)
Albumin: 3.9 g/dL (ref 3.8–4.8)
Alkaline Phosphatase: 42 IU/L — ABNORMAL LOW (ref 44–121)
BUN/Creatinine Ratio: 8 — ABNORMAL LOW (ref 9–23)
BUN: 6 mg/dL (ref 6–20)
Bilirubin Total: <0.2 mg/dL (ref 0.0–1.2)
CO2: 20 mmol/L (ref 20–29)
Calcium: 9.4 mg/dL (ref 8.7–10.2)
Chloride: 103 mmol/L (ref 96–106)
Creatinine, Ser: 0.72 mg/dL (ref 0.57–1.00)
Globulin, Total: 2.1 g/dL (ref 1.5–4.5)
Glucose: 83 mg/dL (ref 65–99)
Potassium: 4.8 mmol/L (ref 3.5–5.2)
Sodium: 136 mmol/L (ref 134–144)
Total Protein: 6 g/dL (ref 6.0–8.5)
eGFR: 115 mL/min/{1.73_m2} (ref >59)

## 2021-01-23 LAB — CBC WITH DIFFERENTIAL/PLATELET
Basophils Absolute: 0 10*3/uL (ref 0.0–0.2)
Basos: 0 %
EOS (ABSOLUTE): 0.1 10*3/uL (ref 0.0–0.4)
Eos: 1 %
Hematocrit: 32.3 % — ABNORMAL LOW (ref 34.0–46.6)
Hemoglobin: 10.5 g/dL — ABNORMAL LOW (ref 11.1–15.9)
Immature Grans (Abs): 0 10*3/uL (ref 0.0–0.1)
Immature Granulocytes: 0 %
Lymphocytes Absolute: 1.5 10*3/uL (ref 0.7–3.1)
Lymphs: 29 %
MCH: 28.9 pg (ref 26.6–33.0)
MCHC: 32.5 g/dL (ref 31.5–35.7)
MCV: 89 fL (ref 79–97)
Monocytes Absolute: 0.4 10*3/uL (ref 0.1–0.9)
Monocytes: 8 %
Neutrophils Absolute: 3.1 10*3/uL (ref 1.4–7.0)
Neutrophils: 62 %
Platelets: 240 10*3/uL (ref 150–450)
RBC: 3.63 x10E6/uL — ABNORMAL LOW (ref 3.77–5.28)
RDW: 13.2 % (ref 11.7–15.4)
WBC: 5.1 10*3/uL (ref 3.4–10.8)

## 2021-01-23 LAB — T4, FREE: Free T4: 1.23 ng/dL (ref 0.82–1.77)

## 2021-01-23 LAB — T3, FREE: T3, Free: 3.8 pg/mL (ref 2.0–4.4)

## 2021-01-23 LAB — MAGNESIUM: Magnesium: 1.7 mg/dL (ref 1.6–2.3)

## 2021-01-28 ENCOUNTER — Telehealth: Payer: Self-pay

## 2021-01-28 NOTE — Telephone Encounter (Signed)
Spoke with Charline Bills at [place] about missing physician name and CPT code. Charline Bills was given physician's name and CPT code. Understanding was voiced. Shubham Thackston l Susie Pousson, CMA

## 2021-01-28 NOTE — Telephone Encounter (Signed)
Pt called stating she is unable to keep anything down. She states the Phenergan and Diclegis is not working and now she has burning in her stomach and a metallic taste in her mouth. Pt states she can't drink Gingerale or eat crackers.  Makenly Larabee l Geriann Lafont, CMA

## 2021-01-29 ENCOUNTER — Encounter: Payer: Self-pay | Admitting: Cardiology

## 2021-01-29 ENCOUNTER — Ambulatory Visit (INDEPENDENT_AMBULATORY_CARE_PROVIDER_SITE_OTHER): Payer: Medicaid Other

## 2021-01-29 ENCOUNTER — Other Ambulatory Visit: Payer: Self-pay

## 2021-01-29 ENCOUNTER — Ambulatory Visit: Payer: Medicaid Other | Admitting: Cardiology

## 2021-01-29 VITALS — BP 100/60 | HR 88 | Ht 69.0 in | Wt 153.0 lb

## 2021-01-29 DIAGNOSIS — R0602 Shortness of breath: Secondary | ICD-10-CM

## 2021-01-29 DIAGNOSIS — I498 Other specified cardiac arrhythmias: Secondary | ICD-10-CM | POA: Diagnosis not present

## 2021-01-29 DIAGNOSIS — R0789 Other chest pain: Secondary | ICD-10-CM

## 2021-01-29 DIAGNOSIS — R002 Palpitations: Secondary | ICD-10-CM

## 2021-01-29 NOTE — Progress Notes (Signed)
Cardiology Office Note:    Date:  01/29/2021   ID:  Carla [name], DOB [birth year], MRN [id number]  PCP:  Abigail Miyamoto, MD  Cardiologist:  Thomasene Ripple, DO  Electrophysiologist:  None   Referring MD: Abigail Miyamoto,*   Have been having palpitations and shortness of breath throughout this pregnancy  History of Present Illness:    Carla [name] is a 30 y.o. female with a hx of hypothyroidism, asthma, GERD, who is currently 14 weeks and 5 days pregnant( G5 P2022).  The patient was referred for cardio obstetrics care due to significant palpitation, shortness of breath and concern for high risk pregnancy.  She tells me that recently she has had increasing palpitations and shortness of breath.  She notes that in the beginning of her pregnancy she was able to do all of her normal activities without any symptoms.  However recently not able to do her grocery shopping, going up and down her apartment stairs as well as just her basic activity without having increasing palpitations or shortness of breath.  She notes that most of her symptoms will resolve her rest.  She tells me how bothersome this is as she will have episodes where her heart beats is going to arrest and at times she gets some chest pain that is associated with this.  She has not had any syncope episodes or any dizziness.  Of note during her first pregnancy with her 56 year old daughter she did have an episode where she had a syncope event she was noted to be anemic once this was corrected she has not had any syncope episodes.  Her second pregnancy with her son was unremarkable.  He denies any history of preeclampsia, gestational diabetes.  She does have some outside stress as she is a single mother her first 2 children lost her father last December which also led to depression but she tells me things has been working out fine.  She admits to some social support and does not have any significant worries or  needs socially.  She is a non-smoker   Past Medical History:  Diagnosis Date  . ADD (attention deficit disorder)   . Asthma   . Bronchitis   . Eczema   . Hyperthyroidism   . Insomnia   . Migraine   . Recurrent upper respiratory infection (URI)   . Urticaria     Past Surgical History:  Procedure Laterality Date  . ADENOIDECTOMY    . NASAL SEPTOPLASTY W/ TURBINOPLASTY  2017  . SINOSCOPY    . TONSILLECTOMY      Current Medications: Current Meds  Medication Sig  . albuterol (PROVENTIL HFA;VENTOLIN HFA) 108 (90 Base) MCG/ACT inhaler Inhale 2 puffs into the lungs every 6 (six) hours as needed for wheezing or shortness of breath.  . Ascorbic Acid (VITAMIN C) 1000 MG tablet Take 1,000 mg by mouth daily.  . budesonide-formoterol (SYMBICORT) 160-4.5 MCG/ACT inhaler Inhale 2 puffs into the lungs 2 (two) times daily.  . cholecalciferol (VITAMIN D3) 25 MCG (1000 UNIT) tablet Take 1,000 Units by mouth daily.  . Doxylamine-Pyridoxine (DICLEGIS) 10-10 MG TBEC Take 1 tablet by mouth in the morning and at bedtime.  . ferrous sulfate (FERROUSUL) 325 (65 FE) MG tablet Take 1 tablet (325 mg total) by mouth every other day.  . fluticasone (FLONASE) 50 MCG/ACT nasal spray Place 1 spray into both nostrils 2 (two) times daily.  Marland Kitchen omeprazole (PRILOSEC) 40 MG capsule Take 1 capsule (40 mg total) by mouth  daily.  . ondansetron (ZOFRAN ODT) 4 MG disintegrating tablet Take 1 tablet (4 mg total) by mouth every 8 (eight) hours as needed for nausea or vomiting.  . Prenatal Vit-Fe Fumarate-FA (PRENATAL MULTIVITAMIN) TABS tablet Take 1 tablet by mouth daily at 12 noon.  . promethazine (PHENERGAN) 12.5 MG tablet Take 1 tablet (12.5 mg total) by mouth every 6 (six) hours as needed for nausea or vomiting.     Allergies:   Latex   Social History   Socioeconomic History  . Marital status: Single    Spouse name: Not on file  . Number of children: 2  . Years of education: Not on file  . Highest education  level: Some college, no degree  Occupational History  . Not on file  Tobacco Use  . Smoking status: Former Smoker    Packs/day: 0.25    Years: 4.00    Pack years: 1.00    Types: Cigarettes    Quit date: 08/18/2018    Years since quitting: 2.4  . Smokeless tobacco: Never Used  . Tobacco comment: patient quit 2 weeks ago. 09-01-18  Vaping Use  . Vaping Use: Never used  Substance and Sexual Activity  . Alcohol use: Never  . Drug use: Never  . Sexual activity: Yes  Other Topics Concern  . Not on file  Social History Narrative  . Not on file   Social Determinants of Health   Financial Resource Strain: Not on file  Food Insecurity: Not on file  Transportation Needs: Not on file  Physical Activity: Not on file  Stress: Not on file  Social Connections: Not on file     Family History: The patient's family history includes Diabetes in her brother, father, maternal grandmother, and paternal grandfather; Hypertension in her father and mother; Stroke in her maternal grandmother. There is no history of Heart disease.  ROS:   Review of Systems  Constitution: Negative for decreased appetite, fever and weight gain.  HENT: Negative for congestion, ear discharge, hoarse voice and sore throat.   Eyes: Negative for discharge, redness, vision loss in right eye and visual halos.  Cardiovascular: Negative for chest pain, dyspnea on exertion, leg swelling, orthopnea and palpitations.  Respiratory: Negative for cough, hemoptysis, shortness of breath and snoring.   Endocrine: Negative for heat intolerance and polyphagia.  Hematologic/Lymphatic: Negative for bleeding problem. Does not bruise/bleed easily.  Skin: Negative for flushing, nail changes, rash and suspicious lesions.  Musculoskeletal: Negative for arthritis, joint pain, muscle cramps, myalgias, neck pain and stiffness.  Gastrointestinal: Negative for abdominal pain, bowel incontinence, diarrhea and excessive appetite.  Genitourinary:  Negative for decreased libido, genital sores and incomplete emptying.  Neurological: Negative for brief paralysis, focal weakness, headaches and loss of balance.  Psychiatric/Behavioral: Negative for altered mental status, depression and suicidal ideas.  Allergic/Immunologic: Negative for HIV exposure and persistent infections.    EKGs/Labs/Other Studies Reviewed:    The following studies were reviewed today:   EKG:  The ekg ordered today demonstrates sinus rhythm, heart rate 88 bpm with arrhythmia.  ZIO monitor which was in December 2021 1. The basic rhythm is normal sinus with an average HR of 85 bpm, periods of sinus tachycardia noted 2. No atrial fibrillation or flutter 3. No high-grade heart block or pathologic pauses 4. There are rare PVC's and rare supraventricular beats without sustained arrhythmias   Recent Labs: 12/24/2020: TSH 0.012 01/22/2021: ALT 12; BUN 6; Creatinine, Ser 0.72; Hemoglobin 10.5; Magnesium 1.7; Platelets 240; Potassium 4.8; Sodium 136  Recent Lipid Panel No results found for: CHOL, TRIG, HDL, CHOLHDL, VLDL, LDLCALC, LDLDIRECT  Physical Exam:    VS:  BP 100/60 (BP Location: Right Arm)   Pulse 88   Ht  (1.753 m)   Wt 153 lb (69.4 kg)   LMP  (LMP Unknown) Comment: reports irreg cycles, ? LMP sometime in Oct, spotted ~11/3  SpO2 98%   BMI 22.59 kg/m     Wt Readings from Last 3 Encounters:  01/29/21 153 lb (69.4 kg)  01/22/21 148 lb (67.1 kg)  12/24/20 144 lb (65.3 kg)     GEN: Well nourished, well developed in no acute distress HEENT: Normal NECK: No JVD; No carotid bruits LYMPHATICS: No lymphadenopathy CARDIAC: S1S2 noted,RRR, no murmurs, rubs, gallops RESPIRATORY:  Clear to auscultation without rales, wheezing or rhonchi  ABDOMEN: Soft, non-tender, non-distended, +bowel sounds, no guarding. EXTREMITIES: No edema, No cyanosis, no clubbing MUSCULOSKELETAL:  No deformity  SKIN: Warm and dry NEUROLOGIC:  Alert and oriented x 3,  non-focal PSYCHIATRIC:  Normal affect, good insight  ASSESSMENT:    1. Palpitations   2. Shortness of breath   3. Sinus arrhythmia   4. Atypical chest pain    PLAN:     She has had monitor done in December 2021 which also able to review.  But giving her worsening palpitations though this could be a physiologic pregnancy change but she has underlying hypothyroidism which appears to not have been controlled with her TSH at 0.012 I would like to make sure that atrial fibrillation and other atrial arrhythmias or not playing a role here at this time.  Therefore I am going to place a monitor on the patient for 14 days.  In addition, a transthoracic echocardiogram will be helpful to understand to make sure that her shortness of breath is not related to any cardiomyopathy or any structural abnormalities in this patient.   The patient is in agreement with the above plan. The patient left the office in stable condition.  The patient will follow up in 8 weeks or sooner if needed.   Medication Adjustments/Labs and Tests Ordered: Current medicines are reviewed at length with the patient today.  Concerns regarding medicines are outlined above.  Orders Placed This Encounter  Procedures  . LONG TERM MONITOR (3-14 DAYS)  . EKG 12-Lead  . ECHOCARDIOGRAM COMPLETE   No orders of the defined types were placed in this encounter.   Patient Instructions   Medication Instructions:  Your physician recommends that you continue on your current medications as directed. Please refer to the Current Medication list given to you today.  *If you need a refill on your cardiac medications before your next appointment, please call your pharmacy*   Lab Work: None If you have labs (blood work) drawn today and your tests are completely normal, you will receive your results only by: Marland Kitchen MyChart Message (if you have MyChart) OR . A paper copy in the mail If you have any lab test that is abnormal or we need to  change your treatment, we will call you to review the results.   Testing/Procedures: Your physician has requested that you have an echocardiogram. Echocardiography is a painless test that uses sound waves to create images of your heart. It provides your doctor with information about the size and shape of your heart and how well your heart's chambers and valves are working. This procedure takes approximately one hour. There are no restrictions for this procedure.  A zio monitor  was ordered today. It will remain on for 14 days. You will then return monitor and event diary in provided box. It takes 1-2 weeks for report to be downloaded and returned to [place]. We will call you with the results. If monitor falls off or has orange flashing light, please call Zio for further instructions.     Follow-Up: At [facility], you and your health needs are our priority.  As part of our continuing mission to provide you with exceptional heart care, we have created designated Provider Care Teams.  These Care Teams include your primary Cardiologist (physician) and Advanced Practice Providers (APPs -  Physician Assistants and Nurse Practitioners) who all work together to provide you with the care you need, when you need it.  We recommend signing up for the patient portal called "MyChart".  Sign up information is provided on this After Visit Summary.  MyChart is used to connect with patients for Virtual Visits (Telemedicine).  Patients are able to view lab/test results, encounter notes, upcoming appointments, etc.  Non-urgent messages can be sent to your provider as well.   To learn more about what you can do with MyChart, go to [email or web address].    Your next appointment:   8 week(s)  The format for your next appointment:   In Person  Provider:   Thomasene Ripple, DO   Other Instructions  Echocardiogram An echocardiogram is a test that uses sound waves (ultrasound) to produce images of the heart. Images  from an echocardiogram can provide important information about:  Heart size and shape.  The size and thickness and movement of your heart's walls.  Heart muscle function and strength.  Heart valve function or if you have stenosis. Stenosis is when the heart valves are too narrow.  If blood is flowing backward through the heart valves (regurgitation).  A tumor or infectious growth around the heart valves.  Areas of heart muscle that are not working well because of poor blood flow or injury from a heart attack.  Aneurysm detection. An aneurysm is a weak or damaged part of an artery wall. The wall bulges out from the normal force of blood pumping through the body. Tell a health care provider about:  Any allergies you have.  All medicines you are taking, including vitamins, herbs, eye drops, creams, and over-the-counter medicines.  Any blood disorders you have.  Any surgeries you have had.  Any medical conditions you have.  Whether you are pregnant or may be pregnant. What are the risks? Generally, this is a safe test. However, problems may occur, including an allergic reaction to dye (contrast) that may be used during the test. What happens before the test? No specific preparation is needed. You may eat and drink normally. What happens during the test?  You will take off your clothes from the waist up and put on a hospital gown.  Electrodes or electrocardiogram (ECG)patches may be placed on your chest. The electrodes or patches are then connected to a device that monitors your heart rate and rhythm.  You will lie down on a table for an ultrasound exam. A gel will be applied to your chest to help sound waves pass through your skin.  A handheld device, called a transducer, will be pressed against your chest and moved over your heart. The transducer produces sound waves that travel to your heart and bounce back (or "echo" back) to the transducer. These sound waves will be captured  in real-time and changed into images of  your heart that can be viewed on a video monitor. The images will be recorded on a computer and reviewed by your health care provider.  You may be asked to change positions or hold your breath for a short time. This makes it easier to get different views or better views of your heart.  In some cases, you may receive contrast through an IV in one of your veins. This can improve the quality of the pictures from your heart. The procedure may vary among health care providers and hospitals.   What can I expect after the test? You may return to your normal, everyday life, including diet, activities, and medicines, unless your health care provider tells you not to do that. Follow these instructions at home:  It is up to you to get the results of your test. Ask your health care provider, or the department that is doing the test, when your results will be ready.  Keep all follow-up visits. This is important. Summary  An echocardiogram is a test that uses sound waves (ultrasound) to produce images of the heart.  Images from an echocardiogram can provide important information about the size and shape of your heart, heart muscle function, heart valve function, and other possible heart problems.  You do not need to do anything to prepare before this test. You may eat and drink normally.  After the echocardiogram is completed, you may return to your normal, everyday life, unless your health care provider tells you not to do that. This information is not intended to replace advice given to you by your health care provider. Make sure you discuss any questions you have with your health care provider. Document Revised: 07/03/2020 Document Reviewed: 07/03/2020 Elsevier Patient Education  2021 Elsevier Inc.      Adopting a Healthy Lifestyle.  Know what a healthy weight is for you (roughly BMI <25) and aim to maintain this   Aim for 7+ servings of fruits and  vegetables daily   65-80+ fluid ounces of water or unsweet tea for healthy kidneys   Limit to max 1 drink of alcohol per day; avoid smoking/tobacco   Limit animal fats in diet for cholesterol and heart health - choose grass fed whenever available   Avoid highly processed foods, and foods high in saturated/trans fats   Aim for low stress - take time to unwind and care for your mental health   Aim for 150 min of moderate intensity exercise weekly for heart health, and weights twice weekly for bone health   Aim for 7-9 hours of sleep daily   When it comes to diets, agreement about the perfect plan isnt easy to find, even among the experts. Experts at the [facility] [employer] developed an idea known as the Healthy Eating Plate. Just imagine a plate divided into logical, healthy portions.   The emphasis is on diet quality:   Load up on vegetables and fruits - one-half of your plate: Aim for color and variety, and remember that potatoes dont count.   Go for whole grains - one-quarter of your plate: Whole wheat, barley, wheat berries, quinoa, oats, brown rice, and foods made with them. If you want pasta, go with whole wheat pasta.   Protein power - one-quarter of your plate: Fish, chicken, beans, and nuts are all healthy, versatile protein sources. Limit red meat.   The diet, however, does go beyond the plate, offering a few other suggestions.   Use healthy plant oils, such  as olive, canola, soy, corn, sunflower and peanut. Check the labels, and avoid partially hydrogenated oil, which have unhealthy trans fats.   If youre thirsty, drink water. Coffee and tea are good in moderation, but skip sugary drinks and limit milk and dairy products to one or two daily servings.   The type of carbohydrate in the diet is more important than the amount. Some sources of carbohydrates, such as vegetables, fruits, whole grains, and beans-are healthier than others.   Finally, stay  active  Signed, Thomasene RippleKardie Graiden Henes, DO  01/29/2021 11:34 AM    [facility]

## 2021-01-29 NOTE — Patient Instructions (Signed)
Medication Instructions:  Your physician recommends that you continue on your current medications as directed. Please refer to the Current Medication list given to you today.  *If you need a refill on your cardiac medications before your next appointment, please call your pharmacy*   Lab Work: None If you have labs (blood work) drawn today and your tests are completely normal, you will receive your results only by: . MyChart Message (if you have MyChart) OR . A paper copy in the mail If you have any lab test that is abnormal or we need to change your treatment, we will call you to review the results.   Testing/Procedures: Your physician has requested that you have an echocardiogram. Echocardiography is a painless test that uses sound waves to create images of your heart. It provides your doctor with information about the size and shape of your heart and how well your heart's chambers and valves are working. This procedure takes approximately one hour. There are no restrictions for this procedure.  A zio monitor was ordered today. It will remain on for 14 days. You will then return monitor and event diary in provided box. It takes 1-2 weeks for report to be downloaded and returned to us. We will call you with the results. If monitor falls off or has orange flashing light, please call Zio for further instructions.    Follow-Up: At [facility], you and your health needs are our priority.  As part of our continuing mission to provide you with exceptional heart care, we have created designated Provider Care Teams.  These Care Teams include your primary Cardiologist (physician) and Advanced Practice Providers (APPs -  Physician Assistants and Nurse Practitioners) who all work together to provide you with the care you need, when you need it.  We recommend signing up for the patient portal called "MyChart".  Sign up information is provided on this After Visit Summary.  MyChart is used to connect  with patients for Virtual Visits (Telemedicine).  Patients are able to view lab/test results, encounter notes, upcoming appointments, etc.  Non-urgent messages can be sent to your provider as well.   To learn more about what you can do with MyChart, go to [email or web address].    Your next appointment:   8 week(s)  The format for your next appointment:   In Person  Provider:   Kardie Tobb, DO   Other Instructions  Echocardiogram An echocardiogram is a test that uses sound waves (ultrasound) to produce images of the heart. Images from an echocardiogram can provide important information about:  Heart size and shape.  The size and thickness and movement of your heart's walls.  Heart muscle function and strength.  Heart valve function or if you have stenosis. Stenosis is when the heart valves are too narrow.  If blood is flowing backward through the heart valves (regurgitation).  A tumor or infectious growth around the heart valves.  Areas of heart muscle that are not working well because of poor blood flow or injury from a heart attack.  Aneurysm detection. An aneurysm is a weak or damaged part of an artery wall. The wall bulges out from the normal force of blood pumping through the body. Tell a health care provider about:  Any allergies you have.  All medicines you are taking, including vitamins, herbs, eye drops, creams, and over-the-counter medicines.  Any blood disorders you have.  Any surgeries you have had.  Any medical conditions you have.  Whether you are pregnant or   may be pregnant. What are the risks? Generally, this is a safe test. However, problems may occur, including an allergic reaction to dye (contrast) that may be used during the test. What happens before the test? No specific preparation is needed. You may eat and drink normally. What happens during the test?  You will take off your clothes from the waist up and put on a hospital  gown.  Electrodes or electrocardiogram (ECG)patches may be placed on your chest. The electrodes or patches are then connected to a device that monitors your heart rate and rhythm.  You will lie down on a table for an ultrasound exam. A gel will be applied to your chest to help sound waves pass through your skin.  A handheld device, called a transducer, will be pressed against your chest and moved over your heart. The transducer produces sound waves that travel to your heart and bounce back (or "echo" back) to the transducer. These sound waves will be captured in real-time and changed into images of your heart that can be viewed on a video monitor. The images will be recorded on a computer and reviewed by your health care provider.  You may be asked to change positions or hold your breath for a short time. This makes it easier to get different views or better views of your heart.  In some cases, you may receive contrast through an IV in one of your veins. This can improve the quality of the pictures from your heart. The procedure may vary among health care providers and hospitals.   What can I expect after the test? You may return to your normal, everyday life, including diet, activities, and medicines, unless your health care provider tells you not to do that. Follow these instructions at home:  It is up to you to get the results of your test. Ask your health care provider, or the department that is doing the test, when your results will be ready.  Keep all follow-up visits. This is important. Summary  An echocardiogram is a test that uses sound waves (ultrasound) to produce images of the heart.  Images from an echocardiogram can provide important information about the size and shape of your heart, heart muscle function, heart valve function, and other possible heart problems.  You do not need to do anything to prepare before this test. You may eat and drink normally.  After the  echocardiogram is completed, you may return to your normal, everyday life, unless your health care provider tells you not to do that. This information is not intended to replace advice given to you by your health care provider. Make sure you discuss any questions you have with your health care provider. Document Revised: 07/03/2020 Document Reviewed: 07/03/2020 Elsevier Patient Education  2021 Elsevier Inc.   

## 2021-01-30 ENCOUNTER — Other Ambulatory Visit: Payer: Self-pay | Admitting: Family Medicine

## 2021-01-30 DIAGNOSIS — Z3480 Encounter for supervision of other normal pregnancy, unspecified trimester: Secondary | ICD-10-CM

## 2021-01-30 MED ORDER — SCOPOLAMINE 1 MG/3DAYS TD PT72: 1.0000 | MEDICATED_PATCH | TRANSDERMAL | 12 refills | Status: DC

## 2021-01-30 MED ORDER — PANTOPRAZOLE SODIUM 40 MG PO TBEC: 40.0000 mg | DELAYED_RELEASE_TABLET | Freq: Every day | ORAL | 2 refills | Status: DC

## 2021-02-21 ENCOUNTER — Other Ambulatory Visit: Payer: Self-pay

## 2021-02-21 ENCOUNTER — Ambulatory Visit (INDEPENDENT_AMBULATORY_CARE_PROVIDER_SITE_OTHER): Payer: Medicaid Other | Admitting: Family Medicine

## 2021-02-21 VITALS — BP 107/56 | HR 85 | Wt 150.0 lb

## 2021-02-21 DIAGNOSIS — G4486 Cervicogenic headache: Secondary | ICD-10-CM

## 2021-02-21 DIAGNOSIS — O99280 Endocrine, nutritional and metabolic diseases complicating pregnancy, unspecified trimester: Secondary | ICD-10-CM

## 2021-02-21 DIAGNOSIS — R002 Palpitations: Secondary | ICD-10-CM

## 2021-02-21 DIAGNOSIS — O219 Vomiting of pregnancy, unspecified: Secondary | ICD-10-CM

## 2021-02-21 DIAGNOSIS — E0590 Thyrotoxicosis, unspecified without thyrotoxic crisis or storm: Secondary | ICD-10-CM

## 2021-02-21 DIAGNOSIS — Z3A18 18 weeks gestation of pregnancy: Secondary | ICD-10-CM

## 2021-02-21 DIAGNOSIS — Z3480 Encounter for supervision of other normal pregnancy, unspecified trimester: Secondary | ICD-10-CM

## 2021-02-21 MED ORDER — CYCLOBENZAPRINE HCL 10 MG PO TABS: 5.0000 mg | ORAL_TABLET | Freq: Three times a day (TID) | ORAL | 2 refills | Status: DC | PRN

## 2021-02-21 NOTE — Progress Notes (Signed)
   PRENATAL VISIT NOTE  Subjective:  Carla [name] is a 30 y.o. [phone] at [redacted]w[redacted]d being seen today for ongoing prenatal care.  She is currently monitored for the following issues for this high-risk pregnancy and has Cervical radiculopathy; Slow transit constipation; Supervision of other normal pregnancy, antepartum; Hyperthyroidism affecting pregnancy, antepartum; Anemia, antepartum; Abnormal Pap smear of cervix; History of depression; Urticaria; Recurrent upper respiratory infection (URI); Migraine; Insomnia; Hyperthyroidism; Eczema; Bronchitis; Asthma; ADD (attention deficit disorder); and Old vaginal laceration on their problem list.  Patient reports headache 2-3 times a week. feels like it comes from her right upper neck with pain that radiates into head and down into right shoulder. Does have a history of migraines, but hasn't had any for awhile. Had an really intense headache a few days ago that lasted for 3 days - resolved after vomiting. .  Contractions: Not present. Vag. Bleeding: None.  Movement: Present. Denies leaking of fluid.   The following portions of the patient's history were reviewed and updated as appropriate: allergies, current medications, past family history, past medical history, past social history, past surgical history and problem list.   Objective:   Vitals:   02/21/21 0905  BP: (!) 107/56  Pulse: 85  Weight: 150 lb (68 kg)    Fetal Status: Fetal Heart Rate (bpm): 149   Movement: Present     General:  Alert, oriented and cooperative. Patient is in no acute distress.  Skin: Skin is warm and dry. No rash noted.   Cardiovascular: Normal heart rate noted  Respiratory: Normal respiratory effort, no problems with respiration noted  Abdomen: Soft, gravid, appropriate for gestational age.  Pain/Pressure: Present     Pelvic: Cervical exam deferred        Extremities: Normal range of motion.  Edema: None  MSK: Paraspinal muscle spasm in upper cervical spine   Mental Status: Normal mood and affect. Normal behavior. Normal judgment and thought content.   Assessment and Plan:  Pregnancy: [id number] at [redacted]w[redacted]d 1. [redacted] weeks gestation of pregnancy - AFP, Serum, Open Spina Bifida - TSH - T3 - T4  2. Hyperthyroidism affecting pregnancy, antepartum Checked on 3/1 - T3/T4 normal. Has history of hyperthyroidism. - TSH - T3 - T4  3. Supervision of other normal pregnancy, antepartum FHT and FH normal  4. Palpitations Seeing cardiology. Has echo scheduled.  5. Nausea and vomiting of pregnancy, antepartum Controlled with antiemetics.  6. Cervicogenic headache Trial of flexeril. Will get appt with Nada Maclachlan, PA-C   Preterm labor symptoms and general obstetric precautions including but not limited to vaginal bleeding, contractions, leaking of fluid and fetal movement were reviewed in detail with the patient. Please refer to After Visit Summary for other counseling recommendations.   No follow-ups on file.  Future Appointments  Date Time Provider Department Center  02/27/2021 10:15 AM [facility] 1 [facility]  02/28/2021 10:15 AM [facility] US2 [facility]  03/11/2021  8:00 AM Tobb, Kardie, DO [facility] None  03/20/2021 10:30 AM Levie Heritage, DO [facility] None    Levie Heritage, DO

## 2021-02-22 LAB — T3: T3, Total: 209 ng/dL — ABNORMAL HIGH (ref 71–180)

## 2021-02-22 LAB — T4: T4, Total: 12.2 ug/dL — ABNORMAL HIGH (ref 4.5–12.0)

## 2021-02-22 LAB — TSH: TSH: 0.868 u[IU]/mL (ref 0.450–4.500)

## 2021-02-23 LAB — AFP, SERUM, OPEN SPINA BIFIDA
AFP MoM: 1.5
AFP Value: 75.2 ng/mL
Gest. Age on Collection Date: 18 weeks
Maternal Age At EDD: 31.5 yr
OSBR Risk 1 IN: 5468
Test Results:: NEGATIVE
Weight: 150 [lb_av]

## 2021-02-27 ENCOUNTER — Other Ambulatory Visit: Payer: Self-pay

## 2021-02-27 ENCOUNTER — Ambulatory Visit (HOSPITAL_BASED_OUTPATIENT_CLINIC_OR_DEPARTMENT_OTHER)
Admission: RE | Admit: 2021-02-27 | Discharge: 2021-02-27 | Disposition: A | Discharge status: Home / Self Care | Payer: Medicaid Other | Source: Ambulatory Visit | Attending: Cardiology | Admitting: Cardiology

## 2021-02-27 ENCOUNTER — Telehealth: Payer: Self-pay | Admitting: Radiology

## 2021-02-27 DIAGNOSIS — R0602 Shortness of breath: Secondary | ICD-10-CM

## 2021-02-27 LAB — ECHOCARDIOGRAM COMPLETE
Area-P 1/2: 6.54 cm2
S' Lateral: 2.57 cm

## 2021-02-27 NOTE — Telephone Encounter (Signed)
Called patient to scheduled New Headache appointment in June.

## 2021-02-28 ENCOUNTER — Other Ambulatory Visit: Payer: Self-pay | Admitting: Obstetrics

## 2021-02-28 ENCOUNTER — Ambulatory Visit: Payer: Medicaid Other | Attending: Certified Nurse Midwife

## 2021-02-28 ENCOUNTER — Other Ambulatory Visit: Payer: Self-pay | Admitting: Certified Nurse Midwife

## 2021-02-28 DIAGNOSIS — Z3A18 18 weeks gestation of pregnancy: Secondary | ICD-10-CM | POA: Diagnosis not present

## 2021-02-28 DIAGNOSIS — Z363 Encounter for antenatal screening for malformations: Secondary | ICD-10-CM | POA: Diagnosis present

## 2021-02-28 DIAGNOSIS — E0590 Thyrotoxicosis, unspecified without thyrotoxic crisis or storm: Secondary | ICD-10-CM

## 2021-02-28 DIAGNOSIS — O99512 Diseases of the respiratory system complicating pregnancy, second trimester: Secondary | ICD-10-CM | POA: Diagnosis not present

## 2021-02-28 DIAGNOSIS — O322XX0 Maternal care for transverse and oblique lie, not applicable or unspecified: Secondary | ICD-10-CM

## 2021-02-28 DIAGNOSIS — O99282 Endocrine, nutritional and metabolic diseases complicating pregnancy, second trimester: Secondary | ICD-10-CM | POA: Insufficient documentation

## 2021-02-28 DIAGNOSIS — O99342 Other mental disorders complicating pregnancy, second trimester: Secondary | ICD-10-CM

## 2021-02-28 DIAGNOSIS — J45909 Unspecified asthma, uncomplicated: Secondary | ICD-10-CM | POA: Insufficient documentation

## 2021-02-28 DIAGNOSIS — O09292 Supervision of pregnancy with other poor reproductive or obstetric history, second trimester: Secondary | ICD-10-CM | POA: Insufficient documentation

## 2021-02-28 DIAGNOSIS — Z3A19 19 weeks gestation of pregnancy: Secondary | ICD-10-CM

## 2021-02-28 DIAGNOSIS — Z3A13 13 weeks gestation of pregnancy: Secondary | ICD-10-CM

## 2021-03-01 ENCOUNTER — Other Ambulatory Visit: Payer: Self-pay | Admitting: *Deleted

## 2021-03-01 DIAGNOSIS — E0590 Thyrotoxicosis, unspecified without thyrotoxic crisis or storm: Secondary | ICD-10-CM

## 2021-03-06 ENCOUNTER — Other Ambulatory Visit: Payer: Self-pay

## 2021-03-06 DIAGNOSIS — F439 Reaction to severe stress, unspecified: Secondary | ICD-10-CM

## 2021-03-06 NOTE — Progress Notes (Signed)
Pt called the office requesting a referral to the Behavior Clinician because she is feeling stressed. A referral was ordered. [employer] l Jerrilyn Messinger, CMA

## 2021-03-11 ENCOUNTER — Encounter: Payer: Self-pay | Admitting: Cardiology

## 2021-03-11 ENCOUNTER — Ambulatory Visit: Payer: Medicaid Other | Admitting: Cardiology

## 2021-03-11 ENCOUNTER — Other Ambulatory Visit: Payer: Self-pay

## 2021-03-11 VITALS — BP 92/60 | HR 92 | Ht 69.0 in | Wt 152.0 lb

## 2021-03-11 DIAGNOSIS — R42 Dizziness and giddiness: Secondary | ICD-10-CM | POA: Insufficient documentation

## 2021-03-11 DIAGNOSIS — R0789 Other chest pain: Secondary | ICD-10-CM | POA: Diagnosis not present

## 2021-03-11 DIAGNOSIS — R002 Palpitations: Secondary | ICD-10-CM

## 2021-03-11 DIAGNOSIS — I951 Orthostatic hypotension: Secondary | ICD-10-CM | POA: Insufficient documentation

## 2021-03-11 NOTE — Progress Notes (Signed)
Cardiology Office Note:    Date:  03/11/2021   ID:  Carla [name], DOB 03/12/1991, MRN [id number]  PCP:  Abigail Miyamoto, MD  Cardiologist:  Thomasene Ripple, DO  Electrophysiologist:  None   Referring MD: Abigail Miyamoto,*   Chief Complaint  Patient presents with  . Follow-up    History of Present Illness:    Carla [name] is a 30 y.o. female with a hx of urinary symptoms and significant currently [redacted] weeks pregnant ( G5 P2022).    Presents today for follow-up visit.  Hx of the patient on January 29, 2021 at that time she presented due to increasing palpitation and shortness of breath.  At the conclusion of our visit we will review her previous monitor which was normal as well as refer the patient for echocardiogram.  Today she is here for follow-up visit.  She tells me she has been having some lightheadedness.  She does have palpitations at time associated with her symptoms.  Past Medical History:  Diagnosis Date  . ADD (attention deficit disorder)   . Asthma   . Bronchitis   . Eczema   . Hyperthyroidism   . Insomnia   . Migraine   . Recurrent upper respiratory infection (URI)   . Urticaria     Past Surgical History:  Procedure Laterality Date  . ADENOIDECTOMY    . NASAL SEPTOPLASTY W/ TURBINOPLASTY  2017  . SINOSCOPY    . TONSILLECTOMY      Current Medications: Current Meds  Medication Sig  . albuterol (PROVENTIL HFA;VENTOLIN HFA) 108 (90 Base) MCG/ACT inhaler Inhale 2 puffs into the lungs every 6 (six) hours as needed for wheezing or shortness of breath.  . Ascorbic Acid (VITAMIN C) 1000 MG tablet Take 1,000 mg by mouth daily.  . budesonide-formoterol (SYMBICORT) 160-4.5 MCG/ACT inhaler Inhale 2 puffs into the lungs 2 (two) times daily.  . cholecalciferol (VITAMIN D3) 25 MCG (1000 UNIT) tablet Take 1,000 Units by mouth daily.  . cyclobenzaprine (FLEXERIL) 10 MG tablet Take 0.5-1 tablets (5-10 mg total) by mouth 3 (three) times daily as  needed (headache).  . ferrous sulfate (FERROUSUL) 325 (65 FE) MG tablet Take 1 tablet (325 mg total) by mouth every other day.  . ondansetron (ZOFRAN ODT) 4 MG disintegrating tablet Take 1 tablet (4 mg total) by mouth every 8 (eight) hours as needed for nausea or vomiting.  . Prenatal Vit-Fe Fumarate-FA (PRENATAL MULTIVITAMIN) TABS tablet Take 1 tablet by mouth daily at 12 noon.  . promethazine (PHENERGAN) 12.5 MG tablet Take 1 tablet (12.5 mg total) by mouth every 6 (six) hours as needed for nausea or vomiting.  Marland Kitchen scopolamine (TRANSDERM-SCOP, 1.5 MG,) 1 MG/3DAYS Place 1 patch (1.5 mg total) onto the skin every 3 (three) days.     Allergies:   Latex   Social History   Socioeconomic History  . Marital status: Single    Spouse name: Not on file  . Number of children: 2  . Years of education: Not on file  . Highest education level: Some college, no degree  Occupational History  . Not on file  Tobacco Use  . Smoking status: Former Smoker    Packs/day: 0.25    Years: 4.00    Pack years: 1.00    Types: Cigarettes    Quit date: 08/18/2018    Years since quitting: 2.5  . Smokeless tobacco: Never Used  . Tobacco comment: patient quit 2 weeks ago. 09-01-18  Vaping Use  .  Vaping Use: Never used  Substance and Sexual Activity  . Alcohol use: Never  . Drug use: Never  . Sexual activity: Yes  Other Topics Concern  . Not on file  Social History Narrative  . Not on file   Social Determinants of Health   Financial Resource Strain: Not on file  Food Insecurity: Not on file  Transportation Needs: Not on file  Physical Activity: Not on file  Stress: Not on file  Social Connections: Not on file     Family History: The patient's family history includes Diabetes in her brother, father, maternal grandmother, and paternal grandfather; Hypertension in her father and mother; Stroke in her maternal grandmother. There is no history of Heart disease.  ROS:   Review of Systems  Constitution:  Negative for decreased appetite, fever and weight gain.  HENT: Negative for congestion, ear discharge, hoarse voice and sore throat.   Eyes: Negative for discharge, redness, vision loss in right eye and visual halos.  Cardiovascular: Negative for chest pain, dyspnea on exertion, leg swelling, orthopnea and palpitations.  Respiratory: Negative for cough, hemoptysis, shortness of breath and snoring.   Endocrine: Negative for heat intolerance and polyphagia.  Hematologic/Lymphatic: Negative for bleeding problem. Does not bruise/bleed easily.  Skin: Negative for flushing, nail changes, rash and suspicious lesions.  Musculoskeletal: Negative for arthritis, joint pain, muscle cramps, myalgias, neck pain and stiffness.  Gastrointestinal: Negative for abdominal pain, bowel incontinence, diarrhea and excessive appetite.  Genitourinary: Negative for decreased libido, genital sores and incomplete emptying.  Neurological: Negative for brief paralysis, focal weakness, headaches and loss of balance.  Psychiatric/Behavioral: Negative for altered mental status, depression and suicidal ideas.  Allergic/Immunologic: Negative for HIV exposure and persistent infections.    EKGs/Labs/Other Studies Reviewed:    The following studies were reviewed today:   EKG:  None today   Zio Monitor  The patient wore the monitor for 14 days starting January 29, 2021.   Indication: Palpitations  The minimum heart rate was 57 bpm, maximum heart rate was 174 bpm, and average heart rate was 86 bpm. Predominant underlying rhythm was Sinus Rhythm.   Premature atrial complexes were rare less than 1%. Premature Ventricular complexes were rare less than 1%.  No ventricular tachycardia, no significant pauses, No AV block and no atrial fibrillation present.  A patient triggered events and 7 diary events are associated with sinus rhythm and sinus tachycardia.   Conclusion: Normal/unremarkable study.  We will have to  monitor the patient closely as I do suspect that inappropriate sinus tachycardia may be playing a role here.   Transthoracic echocardiogram 02/27/2021 IMPRESSIONS  1. Left ventricular ejection fraction, by estimation, is 60 to 65%. The left ventricle has normal function. The left ventricle has no regional wall motion abnormalities. Left ventricular diastolic parameters were  normal.  2. Right ventricular systolic function is normal. The right ventricular size is normal. There is normal pulmonary artery systolic pressure.  3. The mitral valve is normal in structure. Trivial mitral valve regurgitation. No evidence of mitral stenosis.  4. The aortic valve is normal in structure. Aortic valve regurgitation is not visualized. No aortic stenosis is present.  5. The inferior vena cava is normal in size with greater than 50% respiratory variability, suggesting right atrial pressure of 3 mmHg.   FINDINGS  Left Ventricle: Left ventricular ejection fraction, by estimation, is 60 to 65%. The left ventricle has normal function. The left ventricle has no regional wall motion abnormalities. The left ventricular internal cavity  size was normal in size. There is no left ventricular hypertrophy. Left ventricular diastolic parameters  were normal.   Right Ventricle: The right ventricular size is normal. No increase in right ventricular wall thickness. Right ventricular systolic function is normal. There is normal pulmonary artery systolic pressure. The tricuspid  regurgitant velocity is 1.76 m/s, and with an assumed right atrial pressure of 3 mmHg, the estimated right ventricular systolic pressure is 15.4 mmHg.   Left Atrium: Left atrial size was normal in size.   Right Atrium: Right atrial size was normal in size.   Pericardium: There is no evidence of pericardial effusion.   Mitral Valve: The mitral valve is normal in structure. Trivial mitral  valve regurgitation. No evidence of mitral valve  stenosis.   Tricuspid Valve: The tricuspid valve is normal in structure. Tricuspid  valve regurgitation is not demonstrated. No evidence of tricuspid  stenosis.   Aortic Valve: The aortic valve is normal in structure. Aortic valve  regurgitation is not visualized. No aortic stenosis is present.   Pulmonic Valve: The pulmonic valve was normal in structure. Pulmonic valve  regurgitation is not visualized. No evidence of pulmonic stenosis.   Aorta: The aortic root is normal in size and structure.   Venous: The inferior vena cava is normal in size with greater than 50%  respiratory variability, suggesting right atrial pressure of 3 mmHg.   IAS/Shunts: No atrial level shunt detected by color flow Doppler.    ZIO monitor which was in December 2021 1. The basic rhythm is normal sinus with an average HR of 85 bpm, periods of sinus tachycardia noted 2. No atrial fibrillation or flutter 3. No high-grade heart block or pathologic pauses 4. There are rare PVC's and rare supraventricular beats without sustained arrhythmias   Recent Labs: 01/22/2021: ALT 12; BUN 6; Creatinine, Ser 0.72; Hemoglobin 10.5; Magnesium 1.7; Platelets 240; Potassium 4.8; Sodium 136 02/21/2021: TSH 0.868  Recent Lipid Panel No results found for: CHOL, TRIG, HDL, CHOLHDL, VLDL, LDLCALC, LDLDIRECT  Physical Exam:    VS:  BP 92/60 (BP Location: Left Arm, Patient Position: Sitting, Cuff Size: Normal)   Pulse 92   Ht  (1.753 m)   Wt 152 lb (68.9 kg)   LMP  (LMP Unknown) Comment: reports irreg cycles, ? LMP sometime in Oct, spotted ~11/3  SpO2 96%   BMI 22.45 kg/m     Wt Readings from Last 3 Encounters:  03/11/21 152 lb (68.9 kg)  02/21/21 150 lb (68 kg)  01/29/21 153 lb (69.4 kg)     GEN: Well nourished, well developed in no acute distress HEENT: Normal NECK: No JVD; No carotid bruits LYMPHATICS: No lymphadenopathy CARDIAC: S1S2 noted,RRR, no murmurs, rubs, gallops RESPIRATORY:  Clear to auscultation  without rales, wheezing or rhonchi  ABDOMEN: Soft, non-tender, non-distended, +bowel sounds, no guarding. EXTREMITIES: No edema, No cyanosis, no clubbing MUSCULOSKELETAL:  No deformity  SKIN: Warm and dry NEUROLOGIC:  Alert and oriented x 3, non-focal PSYCHIATRIC:  Normal affect, good insight  ASSESSMENT:    1. Orthostatic hypotension   2. Palpitations   3. Lightheadedness   4. Atypical chest pain    PLAN:    Her blood pressure in the office today with SBP at 92 - however in the office today not orthostatic positive. I encourage the patient to increase fluid intake.  Her Zio monitor with no arrhythmia - suspected inappropriate sinus tachycardia however in her current pregnant physiologic state this is expected.  We spoke about the  use of MobileKardia for monitoring during these episodes. She will also get a blood pressure cuff to check her bp during these episode as I suspect low blood pressure is playing a role.   Blood work will be done today for CBC, BMP and mag.  The patient is in agreement with the above plan. The patient left the office in stable condition.  The patient will follow up in 12 weeks or sooner if needed.  Medication Adjustments/Labs and Tests Ordered: Current medicines are reviewed at length with the patient today.  Concerns regarding medicines are outlined above.  Orders Placed This Encounter  Procedures  . Basic metabolic panel  . CBC with Differential/Platelet  . Magnesium   No orders of the defined types were placed in this encounter.   Patient Instructions  Medication Instructions:  Your physician recommends that you continue on your current medications as directed. Please refer to the Current Medication list given to you today.  *If you need a refill on your cardiac medications before your next appointment, please call your pharmacy*   Lab Work: Your physician recommends that you return for lab work: TODAY: BMET, Mag, CBC If you have labs (blood  work) drawn today and your tests are completely normal, you will receive your results only by: Marland Kitchen. MyChart Message (if you have MyChart) OR . A paper copy in the mail If you have any lab test that is abnormal or we need to change your treatment, we will call you to review the results.   Testing/Procedures: None   Follow-Up: At [facility], you and your health needs are our priority.  As part of our continuing mission to provide you with exceptional heart care, we have created designated Provider Care Teams.  These Care Teams include your primary Cardiologist (physician) and Advanced Practice Providers (APPs -  Physician Assistants and Nurse Practitioners) who all work together to provide you with the care you need, when you need it.  We recommend signing up for the patient portal called "MyChart".  Sign up information is provided on this After Visit Summary.  MyChart is used to connect with patients for Virtual Visits (Telemedicine).  Patients are able to view lab/test results, encounter notes, upcoming appointments, etc.  Non-urgent messages can be sent to your provider as well.   To learn more about what you can do with MyChart, go to [email or web address].    Your next appointment:   12 week(s)  The format for your next appointment:   In Person  Provider:   Thomasene RippleKardie Alin Chavira, DO   Other Instructions KardiaMobile [email or web address]        FDA-cleared, clinical grade mobile EKG monitor: Lourena SimmondsKardia is the most clinically-validated mobile EKG used by the world's leading cardiac care medical professionals With Basic service, know instantly if your heart rhythm is normal or if atrial fibrillation is detected, and email the last single EKG recording to yourself or your doctor Premium service, available for purchase through the Kardia app for $9.99 per month or $99 per year, includes unlimited history and storage of your EKG recordings, a monthly EKG summary  report to share with your doctor, along with the ability to track your blood pressure, activity and weight Includes one KardiaMobile phone clip FREE SHIPPING: Standard delivery 1-3 business days. Orders placed by 11:00am PST will ship that afternoon. Otherwise, will ship next business day. All orders ship via [employer] Priority Mail from [place], [place]            Adopting a  Healthy Lifestyle.  Know what a healthy weight is for you (roughly BMI <25) and aim to maintain this   Aim for 7+ servings of fruits and vegetables daily   65-80+ fluid ounces of water or unsweet tea for healthy kidneys   Limit to max 1 drink of alcohol per day; avoid smoking/tobacco   Limit animal fats in diet for cholesterol and heart health - choose grass fed whenever available   Avoid highly processed foods, and foods high in saturated/trans fats   Aim for low stress - take time to unwind and care for your mental health   Aim for 150 min of moderate intensity exercise weekly for heart health, and weights twice weekly for bone health   Aim for 7-9 hours of sleep daily   When it comes to diets, agreement about the perfect plan isnt easy to find, even among the experts. Experts at the [facility] [employer] developed an idea known as the Healthy Eating Plate. Just imagine a plate divided into logical, healthy portions.   The emphasis is on diet quality:   Load up on vegetables and fruits - one-half of your plate: Aim for color and variety, and remember that potatoes dont count.   Go for whole grains - one-quarter of your plate: Whole wheat, barley, wheat berries, quinoa, oats, brown rice, and foods made with them. If you want pasta, go with whole wheat pasta.   Protein power - one-quarter of your plate: Fish, chicken, beans, and nuts are all healthy, versatile protein sources. Limit red meat.   The diet, however, does go beyond the plate, offering a few other suggestions.   Use healthy plant oils,  such as olive, canola, soy, corn, sunflower and peanut. Check the labels, and avoid partially hydrogenated oil, which have unhealthy trans fats.   If youre thirsty, drink water. Coffee and tea are good in moderation, but skip sugary drinks and limit milk and dairy products to one or two daily servings.   The type of carbohydrate in the diet is more important than the amount. Some sources of carbohydrates, such as vegetables, fruits, whole grains, and beans-are healthier than others.   Finally, stay active  Signed, Thomasene Ripple, DO  03/11/2021 8:57 AM    [facility]

## 2021-03-11 NOTE — Patient Instructions (Signed)
Medication Instructions:  Your physician recommends that you continue on your current medications as directed. Please refer to the Current Medication list given to you today.  *If you need a refill on your cardiac medications before your next appointment, please call your pharmacy*   Lab Work: Your physician recommends that you return for lab work: TODAY: BMET, Mag, CBC If you have labs (blood work) drawn today and your tests are completely normal, you will receive your results only by: Marland Kitchen MyChart Message (if you have MyChart) OR . A paper copy in the mail If you have any lab test that is abnormal or we need to change your treatment, we will call you to review the results.   Testing/Procedures: None   Follow-Up: At [facility], you and your health needs are our priority.  As part of our continuing mission to provide you with exceptional heart care, we have created designated Provider Care Teams.  These Care Teams include your primary Cardiologist (physician) and Advanced Practice Providers (APPs -  Physician Assistants and Nurse Practitioners) who all work together to provide you with the care you need, when you need it.  We recommend signing up for the patient portal called "MyChart".  Sign up information is provided on this After Visit Summary.  MyChart is used to connect with patients for Virtual Visits (Telemedicine).  Patients are able to view lab/test results, encounter notes, upcoming appointments, etc.  Non-urgent messages can be sent to your provider as well.   To learn more about what you can do with MyChart, go to [email or web address].    Your next appointment:   12 week(s)  The format for your next appointment:   In Person  Provider:   Thomasene Ripple, DO   Other Instructions KardiaMobile [email or web address]        FDA-cleared, clinical grade mobile EKG monitor: Lourena Simmonds is the most clinically-validated mobile EKG used by the world's  leading cardiac care medical professionals With Basic service, know instantly if your heart rhythm is normal or if atrial fibrillation is detected, and email the last single EKG recording to yourself or your doctor Premium service, available for purchase through the Kardia app for $9.99 per month or $99 per year, includes unlimited history and storage of your EKG recordings, a monthly EKG summary report to share with your doctor, along with the ability to track your blood pressure, activity and weight Includes one KardiaMobile phone clip FREE SHIPPING: Standard delivery 1-3 business days. Orders placed by 11:00am PST will ship that afternoon. Otherwise, will ship next business day. All orders ship via [employer] from [place], [place]

## 2021-03-12 ENCOUNTER — Telehealth: Payer: Medicaid Other | Admitting: Licensed Clinical Social Worker

## 2021-03-12 ENCOUNTER — Telehealth: Payer: Self-pay | Admitting: Licensed Clinical Social Worker

## 2021-03-12 LAB — BASIC METABOLIC PANEL
BUN/Creatinine Ratio: 9 (ref 9–23)
BUN: 6 mg/dL (ref 6–20)
CO2: 20 mmol/L (ref 20–29)
Calcium: 8.9 mg/dL (ref 8.7–10.2)
Chloride: 102 mmol/L (ref 96–106)
Creatinine, Ser: 0.68 mg/dL (ref 0.57–1.00)
Glucose: 79 mg/dL (ref 65–99)
Potassium: 4.1 mmol/L (ref 3.5–5.2)
Sodium: 137 mmol/L (ref 134–144)
eGFR: 119 mL/min/{1.73_m2} (ref >59)

## 2021-03-12 LAB — CBC WITH DIFFERENTIAL/PLATELET
Basophils Absolute: 0 10*3/uL (ref 0.0–0.2)
Basos: 0 %
EOS (ABSOLUTE): 0.1 10*3/uL (ref 0.0–0.4)
Eos: 2 %
Hematocrit: 32.5 % — ABNORMAL LOW (ref 34.0–46.6)
Hemoglobin: 10.7 g/dL — ABNORMAL LOW (ref 11.1–15.9)
Immature Grans (Abs): 0 10*3/uL (ref 0.0–0.1)
Immature Granulocytes: 0 %
Lymphocytes Absolute: 1.9 10*3/uL (ref 0.7–3.1)
Lymphs: 32 %
MCH: 30.1 pg (ref 26.6–33.0)
MCHC: 32.9 g/dL (ref 31.5–35.7)
MCV: 91 fL (ref 79–97)
Monocytes Absolute: 0.5 10*3/uL (ref 0.1–0.9)
Monocytes: 8 %
Neutrophils Absolute: 3.4 10*3/uL (ref 1.4–7.0)
Neutrophils: 58 %
Platelets: 241 10*3/uL (ref 150–450)
RBC: 3.56 x10E6/uL — ABNORMAL LOW (ref 3.77–5.28)
RDW: 12.8 % (ref 11.7–15.4)
WBC: 5.8 10*3/uL (ref 3.4–10.8)

## 2021-03-12 LAB — MAGNESIUM: Magnesium: 1.6 mg/dL (ref 1.6–2.3)

## 2021-03-12 NOTE — Telephone Encounter (Signed)
Called pt regarding scheduled mychart visit. Pt reports driving in the car with children and could not complete mychart  Visit. Pt requested reschedule for in person visit

## 2021-03-14 ENCOUNTER — Ambulatory Visit: Payer: Medicaid Other

## 2021-03-15 ENCOUNTER — Other Ambulatory Visit: Payer: Self-pay

## 2021-03-15 ENCOUNTER — Ambulatory Visit: Payer: Medicaid Other

## 2021-03-15 DIAGNOSIS — E0590 Thyrotoxicosis, unspecified without thyrotoxic crisis or storm: Secondary | ICD-10-CM

## 2021-03-15 DIAGNOSIS — O99019 Anemia complicating pregnancy, unspecified trimester: Secondary | ICD-10-CM

## 2021-03-17 LAB — ANEMIA PANEL
Ferritin: 20 ng/mL (ref 15–150)
Folate, Hemolysate: 471 ng/mL
Folate, RBC: 1436 ng/mL (ref >498)
Hematocrit: 32.8 % — ABNORMAL LOW (ref 34.0–46.6)
Iron Saturation: 13 % — ABNORMAL LOW (ref 15–55)
Iron: 55 ug/dL (ref 27–159)
Retic Ct Pct: 1.7 % (ref 0.6–2.6)
Total Iron Binding Capacity: 409 ug/dL (ref 250–450)
UIBC: 354 ug/dL (ref 131–425)
Vitamin B-12: 567 pg/mL (ref 232–1245)

## 2021-03-20 ENCOUNTER — Other Ambulatory Visit: Payer: Self-pay

## 2021-03-20 ENCOUNTER — Ambulatory Visit: Payer: Medicaid Other | Admitting: Cardiology

## 2021-03-20 ENCOUNTER — Ambulatory Visit (INDEPENDENT_AMBULATORY_CARE_PROVIDER_SITE_OTHER): Payer: Medicaid Other | Admitting: Family Medicine

## 2021-03-20 VITALS — BP 107/62 | HR 89 | Wt 153.0 lb

## 2021-03-20 DIAGNOSIS — M9901 Segmental and somatic dysfunction of cervical region: Secondary | ICD-10-CM

## 2021-03-20 DIAGNOSIS — Z3A21 21 weeks gestation of pregnancy: Secondary | ICD-10-CM

## 2021-03-20 DIAGNOSIS — M549 Dorsalgia, unspecified: Secondary | ICD-10-CM

## 2021-03-20 DIAGNOSIS — Z3480 Encounter for supervision of other normal pregnancy, unspecified trimester: Secondary | ICD-10-CM

## 2021-03-20 DIAGNOSIS — R002 Palpitations: Secondary | ICD-10-CM

## 2021-03-20 DIAGNOSIS — M9902 Segmental and somatic dysfunction of thoracic region: Secondary | ICD-10-CM

## 2021-03-20 DIAGNOSIS — O99280 Endocrine, nutritional and metabolic diseases complicating pregnancy, unspecified trimester: Secondary | ICD-10-CM

## 2021-03-20 DIAGNOSIS — E0590 Thyrotoxicosis, unspecified without thyrotoxic crisis or storm: Secondary | ICD-10-CM

## 2021-03-20 DIAGNOSIS — O99891 Other specified diseases and conditions complicating pregnancy: Secondary | ICD-10-CM

## 2021-03-20 DIAGNOSIS — M9903 Segmental and somatic dysfunction of lumbar region: Secondary | ICD-10-CM

## 2021-03-20 NOTE — Progress Notes (Signed)
PRENATAL VISIT NOTE  Subjective:  Carla [name] is a 30 y.o. [phone] at [redacted]w[redacted]d being seen today for ongoing prenatal care.  She is currently monitored for the following issues for this high-risk pregnancy and has Cervical radiculopathy; Slow transit constipation; Supervision of other normal pregnancy, antepartum; Hyperthyroidism affecting pregnancy, antepartum; Anemia, antepartum; Abnormal Pap smear of cervix; History of depression; Urticaria; Recurrent upper respiratory infection (URI); Migraine; Insomnia; Hyperthyroidism; Eczema; Bronchitis; Asthma; ADD (attention deficit disorder); Old vaginal laceration; Lightheadedness; Atypical chest pain; and Orthostatic hypotension on their problem list.  Patient reports headache 2-3 times a week. feels like it comes from her right upper neck with pain that radiates into head and down into right shoulder. Does have a history of migraines, but hasn't had any for awhile. Had an really intense headache a few days ago that lasted for 3 days - resolved after vomiting.  Also having some lower back pain.  Contractions: Not present. Vag. Bleeding: None.  Movement: Present. Denies leaking of fluid.   The following portions of the patient's history were reviewed and updated as appropriate: allergies, current medications, past family history, past medical history, past social history, past surgical history and problem list.   Objective:   Vitals:   03/20/21 1024  BP: 107/62  Pulse: 89  Weight: 153 lb (69.4 kg)    Fetal Status: Fetal Heart Rate (bpm): 147   Movement: Present     General:  Alert, oriented and cooperative. Patient is in no acute distress.  Skin: Skin is warm and dry. No rash noted.   Cardiovascular: Normal heart rate noted  Respiratory: Normal respiratory effort, no problems with respiration noted  Abdomen: Soft, gravid, appropriate for gestational age.  Pain/Pressure: Absent     Pelvic: Cervical exam deferred        Extremities:  Normal range of motion.  Edema: Trace  Mental Status: Normal mood and affect. Normal behavior. Normal judgment and thought content.   MSK: Restriction, tenderness, tissue texture changes, and paraspinal spasm in the Cervical and thoracic spine  Neuro: Moves all four extremities with no focal neurological deficit   OSE: Head OA SSL  Cervical C3 SRR  Thoracic T4 FSRR  Rib T4 inhaled  Lumbar L5 ESRR  Sacrum L/L  Pelvis Right ant innom      Assessment and Plan:  Pregnancy: [id number] at [redacted]w[redacted]d 1. [redacted] weeks gestation of pregnancy  2. Supervision of other normal pregnancy, antepartum FHT and FH normal  3. Hyperthyroidism affecting pregnancy, antepartum Refer to endocrine - Ambulatory referral to Endocrinology  4. Palpitations Sees Cardiology. Zio patch done.  5. Back pain affecting pregnancy in second trimester 6. Somatic dysfunction of spine, thoracic 7. Somatic dysfunction of spine, cervical 8. Somatic dysfunction of lumbar region OMT done after patient permission. HVLA technique utilized. 7 areas treated with improvement of tissue texture and joint mobility. Patient tolerated procedure well.     Preterm labor symptoms and general obstetric precautions including but not limited to vaginal bleeding, contractions, leaking of fluid and fetal movement were reviewed in detail with the patient. Please refer to After Visit Summary for other counseling recommendations.   No follow-ups on file.  Future Appointments  Date Time Provider Department Center  03/25/2021  2:00 PM Gwyndolyn Saxon, [place] [facility] None  03/27/2021 12:30 PM [facility] NURSE [facility]  03/27/2021 12:45 PM [facility] US6 [facility]  04/19/2021  9:15 AM Willodean Rosenthal, MD [facility] None  05/03/2021  8:00 AM Bruna Potter Edwena Blow, PA-C [facility]  05/16/2021  8:15 AM Levie Heritage, DO [facility] None  05/31/2021  1:20 PM Tobb, Lavona Mound, DO [facility] None    Levie Heritage, DO

## 2021-03-25 ENCOUNTER — Ambulatory Visit (INDEPENDENT_AMBULATORY_CARE_PROVIDER_SITE_OTHER): Payer: Medicaid Other | Admitting: Licensed Clinical Social Worker

## 2021-03-25 ENCOUNTER — Telehealth: Payer: Self-pay | Admitting: Licensed Clinical Social Worker

## 2021-03-25 DIAGNOSIS — F439 Reaction to severe stress, unspecified: Secondary | ICD-10-CM | POA: Diagnosis not present

## 2021-03-25 DIAGNOSIS — F4322 Adjustment disorder with anxiety: Secondary | ICD-10-CM

## 2021-03-25 NOTE — BH Specialist Note (Signed)
Integrated Behavioral Health via Telemedicine Visit  03/25/2021 Carla [name] [id number]  Number of Integrated Behavioral Health visits: 1/6 Session Start time: 2:04pm Session End time: 3:14pm Total time: 1 hour and 12 mins via mychart video   Referring Provider: Lorelle Gibbs CMA Patient/Family location: Home  [facility] Provider location: [facility] All persons participating in visit: Pt Carla [name] and LCSWA A. Carla [name]  Types of Service: Individual psychotherapy/video visit   I connected with Carla [name] and/or Carla [name]'s n/a via  Telephone or Video Enabled Telemedicine Application  (Video is Caregility application) and verified that I am speaking with the correct person using two identifiers. Discussed confidentiality: yes   I discussed the limitations of telemedicine and the availability of in person appointments.  Discussed there is a possibility of technology failure and discussed alternative modes of communication if that failure occurs.  I discussed that engaging in this telemedicine visit, they consent to the provision of behavioral healthcare and the services will be billed under their insurance.  Patient and/or legal guardian expressed understanding and consented to Telemedicine visit: yes   Presenting Concerns: Patient and/or family reports the following symptoms/concerns: relationship conflict and stress  Duration of problem: approx one year ; Severity of problem: mild  Patient and/or Family's Strengths/Protective Factors: Secured connections in place such as support and housing   Goals Addressed: Patient will: 1.  Reduce symptoms of: stress  2.  Increase knowledge and/or ability of: stress reducing activity and healthy habits   3.  Demonstrate ability to: self manage symptoms   Progress towards Goals: Ongoing   Interventions: Interventions utilized:  Supportive counseling  Standardized Assessments completed:  Advertising account executive Health from 03/25/2021 in [facility]  PHQ-9 Total Score 10      Assessment: Patient currently experiencing stress   Patient may benefit from integrated behavioral health   Plan: 1. Follow up with behavioral health clinician on : 3 weeks via mychart  2. Behavioral recommendations: Communicate needs to partner, create smart goals, aromatherapy and sleep hygiene for improved sleep habits, delegate task to prevent burnout  3. Referral(s): integrated behavioral health   I discussed the assessment and treatment plan with the patient and/or parent/guardian. They were provided an opportunity to ask questions and all were answered. They agreed with the plan and demonstrated an understanding of the instructions.   They were advised to call back or seek an in-person evaluation if the symptoms worsen or if the condition fails to improve as anticipated.  Gwyndolyn Saxon, LCSW

## 2021-03-25 NOTE — Telephone Encounter (Signed)
Pt was called twice and was made aware of mychart visit. Pt agree to have visit via virtual. Left message for callback

## 2021-03-27 ENCOUNTER — Ambulatory Visit: Payer: Medicaid Other | Attending: Maternal & Fetal Medicine

## 2021-03-27 ENCOUNTER — Other Ambulatory Visit: Payer: Self-pay

## 2021-03-27 ENCOUNTER — Encounter: Payer: Self-pay | Admitting: *Deleted

## 2021-03-27 ENCOUNTER — Ambulatory Visit: Payer: Medicaid Other | Admitting: *Deleted

## 2021-03-27 ENCOUNTER — Other Ambulatory Visit: Payer: Self-pay | Admitting: *Deleted

## 2021-03-27 DIAGNOSIS — O09292 Supervision of pregnancy with other poor reproductive or obstetric history, second trimester: Secondary | ICD-10-CM

## 2021-03-27 DIAGNOSIS — Z363 Encounter for antenatal screening for malformations: Secondary | ICD-10-CM

## 2021-03-27 DIAGNOSIS — O99282 Endocrine, nutritional and metabolic diseases complicating pregnancy, second trimester: Secondary | ICD-10-CM

## 2021-03-27 DIAGNOSIS — E0590 Thyrotoxicosis, unspecified without thyrotoxic crisis or storm: Secondary | ICD-10-CM

## 2021-03-27 DIAGNOSIS — Z3A22 22 weeks gestation of pregnancy: Secondary | ICD-10-CM

## 2021-03-27 DIAGNOSIS — O99019 Anemia complicating pregnancy, unspecified trimester: Secondary | ICD-10-CM | POA: Insufficient documentation

## 2021-03-27 DIAGNOSIS — O99342 Other mental disorders complicating pregnancy, second trimester: Secondary | ICD-10-CM | POA: Diagnosis not present

## 2021-03-27 DIAGNOSIS — Z3480 Encounter for supervision of other normal pregnancy, unspecified trimester: Secondary | ICD-10-CM

## 2021-03-27 DIAGNOSIS — O99280 Endocrine, nutritional and metabolic diseases complicating pregnancy, unspecified trimester: Secondary | ICD-10-CM | POA: Diagnosis present

## 2021-03-27 DIAGNOSIS — J45909 Unspecified asthma, uncomplicated: Secondary | ICD-10-CM

## 2021-03-27 DIAGNOSIS — O99512 Diseases of the respiratory system complicating pregnancy, second trimester: Secondary | ICD-10-CM

## 2021-03-28 ENCOUNTER — Encounter: Payer: Self-pay | Admitting: Internal Medicine

## 2021-03-28 ENCOUNTER — Ambulatory Visit: Payer: Medicaid Other | Admitting: Internal Medicine

## 2021-03-28 VITALS — BP 114/72 | HR 95 | Ht 69.0 in | Wt 155.0 lb

## 2021-03-28 DIAGNOSIS — E0590 Thyrotoxicosis, unspecified without thyrotoxic crisis or storm: Secondary | ICD-10-CM | POA: Diagnosis not present

## 2021-03-28 LAB — COMPREHENSIVE METABOLIC PANEL
ALT: 13 U/L (ref 0–35)
AST: 16 U/L (ref 0–37)
Albumin: 3.8 g/dL (ref 3.5–5.2)
Alkaline Phosphatase: 56 U/L (ref 39–117)
BUN: 7 mg/dL (ref 6–23)
CO2: 26 mEq/L (ref 19–32)
Calcium: 9.1 mg/dL (ref 8.4–10.5)
Chloride: 102 mEq/L (ref 96–112)
Creatinine, Ser: 0.66 mg/dL (ref 0.40–1.20)
GFR: 117.05 mL/min (ref >60.00)
Glucose, Bld: 74 mg/dL (ref 70–99)
Potassium: 3.7 mEq/L (ref 3.5–5.1)
Sodium: 135 mEq/L (ref 135–145)
Total Bilirubin: 0.3 mg/dL (ref 0.2–1.2)
Total Protein: 6.9 g/dL (ref 6.0–8.3)

## 2021-03-28 LAB — CBC WITH DIFFERENTIAL/PLATELET
Basophils Absolute: 0 10*3/uL (ref 0.0–0.1)
Basophils Relative: 0.5 % (ref 0.0–3.0)
Eosinophils Absolute: 0.1 10*3/uL (ref 0.0–0.7)
Eosinophils Relative: 1 % (ref 0.0–5.0)
HCT: 34.4 % — ABNORMAL LOW (ref 36.0–46.0)
Hemoglobin: 11.5 g/dL — ABNORMAL LOW (ref 12.0–15.0)
Lymphocytes Relative: 21.9 % (ref 12.0–46.0)
Lymphs Abs: 1.5 10*3/uL (ref 0.7–4.0)
MCHC: 33.4 g/dL (ref 30.0–36.0)
MCV: 90.3 fl (ref 78.0–100.0)
Monocytes Absolute: 0.5 10*3/uL (ref 0.1–1.0)
Monocytes Relative: 6.7 % (ref 3.0–12.0)
Neutro Abs: 4.8 10*3/uL (ref 1.4–7.7)
Neutrophils Relative %: 69.9 % (ref 43.0–77.0)
Platelets: 252 10*3/uL (ref 150.0–400.0)
RBC: 3.81 Mil/uL — ABNORMAL LOW (ref 3.87–5.11)
RDW: 13.1 % (ref 11.5–15.5)
WBC: 6.9 10*3/uL (ref 4.0–10.5)

## 2021-03-28 LAB — TSH: TSH: 0.7 u[IU]/mL (ref 0.35–4.50)

## 2021-03-28 NOTE — Progress Notes (Signed)
Name: [name]  MRN/ DOB: [id number], [birth year]    Age/ Sex: 30 y.o., female    PCP: Carla Miyamoto, MD   Reason for Endocrinology Evaluation: Hyperthyroidism     Date of Initial Endocrinology Evaluation: 03/28/2021     HPI: [name] is a 30 y.o. female with a past medical history of asthma and migraine headaches. The patient presented for initial endocrinology clinic visit on 03/28/2021 for consultative assistance with her hyperthyroidism.   Pt was diagnosed with hyperthyroidism in 11/2020 with a suppressed TSH at  0.012 uIU/Ml , shortly after testing positive for pregnancy, these results were confirmed  In 12/2020 with elevated FT3 at 4.7 pg/mL but with normal FT4 at 1.66 ng/dL     Per pt she has been diagnosed with hyperthyroidism for ~ 7 yrs. No prior treatment  She is currently at 22.3 weeks of gestation. This is here 3rd pregnancy   Weight stable  Has chronic loose stools Has hand tremors  Has palpitations/dizziness and syncope - follows with heart care  Has occasional neck swelling    She has a daughter born in 2011 and a son born in 2012 Has paternal and maternal grandmother history of thyroid disease   HISTORY:  Past Medical History:  Past Medical History:  Diagnosis Date  . ADD (attention deficit disorder)   . Asthma   . Bronchitis   . Eczema   . Hyperthyroidism   . Insomnia   . Migraine   . Recurrent upper respiratory infection (URI)   . Urticaria     Past Surgical History:  Past Surgical History:  Procedure Laterality Date  . ADENOIDECTOMY    . NASAL SEPTOPLASTY W/ TURBINOPLASTY  2017  . SINOSCOPY    . TONSILLECTOMY        Social History:  reports that she quit smoking about 2 years ago. Her smoking use included cigarettes. She has a 1.00 pack-year smoking history. She has never used smokeless tobacco. She reports that she does not drink alcohol and does not use drugs.  Family History: family history includes  Diabetes in her brother, father, maternal grandmother, and paternal grandfather; Hypertension in her father and mother; Stroke in her maternal grandmother.   HOME MEDICATIONS: Allergies as of 03/28/2021      Reactions   Latex Hives      Medication List       Accurate as of Mar 28, 2021  7:06 AM. If you have any questions, ask your nurse or doctor.        albuterol 108 (90 Base) MCG/ACT inhaler Commonly known as: VENTOLIN HFA Inhale 2 puffs into the lungs every 6 (six) hours as needed for wheezing or shortness of breath.   budesonide-formoterol 160-4.5 MCG/ACT inhaler Commonly known as: SYMBICORT Inhale 2 puffs into the lungs 2 (two) times daily.   cholecalciferol 25 MCG (1000 UNIT) tablet Commonly known as: VITAMIN D3 Take 1,000 Units by mouth daily.   cyclobenzaprine 10 MG tablet Commonly known as: FLEXERIL Take 0.5-1 tablets (5-10 mg total) by mouth 3 (three) times daily as needed (headache).   ferrous sulfate 325 (65 FE) MG tablet Commonly known as: FerrouSul Take 1 tablet (325 mg total) by mouth every other day.   ondansetron 4 MG disintegrating tablet Commonly known as: Zofran ODT Take 1 tablet (4 mg total) by mouth every 8 (eight) hours as needed for nausea or vomiting.   prenatal multivitamin Tabs tablet Take 1 tablet by mouth daily at  12 noon.   promethazine 12.5 MG tablet Commonly known as: PHENERGAN Take 1 tablet (12.5 mg total) by mouth every 6 (six) hours as needed for nausea or vomiting.   scopolamine 1 MG/3DAYS Commonly known as: Transderm-Scop (1.5 MG) Place 1 patch (1.5 mg total) onto the skin every 3 (three) days.   vitamin C 1000 MG tablet Take 1,000 mg by mouth daily.         REVIEW OF SYSTEMS: A comprehensive ROS was conducted with the patient and is negative except as per HPI     OBJECTIVE:  VS: BP 114/72   Pulse 95   Ht 5\' 9"  (1.753 m)   Wt 155 lb (70.3 kg)   LMP  (LMP Unknown) Comment: reports irreg cycles, ? LMP sometime in Oct,  spotted ~11/3  SpO2 98%   BMI 22.89 kg/m    Wt Readings from Last 3 Encounters:  03/20/21 153 lb (69.4 kg)  03/11/21 152 lb (68.9 kg)  02/21/21 150 lb (68 kg)     EXAM: General: Pt appears well and is in NAD  Eyes: External eye exam normal without stare, lid lag or exophthalmos.  EOM intact.   Neck: General: Supple without adenopathy. Thyroid: Thyroid size normal.  No goiter or nodules appreciated. No thyroid bruit.  Lungs: Clear with good BS bilat with no rales, rhonchi, or wheezes  Heart: Auscultation: RRR.  Abdomen: Normoactive bowel sounds, soft, nontender, without masses or organomegaly palpable  Extremities:  BL LE: No pretibial edema normal ROM and strength.  Skin: Hair: Texture and amount normal with gender appropriate distribution Skin Inspection: No rashes Skin Palpation: Skin temperature, texture, and thickness normal to palpation  Neuro: Cranial nerves: II - XII grossly intact  Motor: Normal strength throughout DTRs: 2+ and symmetric in UE without delay in relaxation phase  Mental Status: Judgment, insight: Intact Orientation: Oriented to time, place, and person Mood and affect: No depression, anxiety, or agitation     DATA REVIEWED: Results for Carla, [name] (MRN Carla [name]) as of 03/29/2021 12:57  Ref. Range 03/28/2021 12:28  Sodium Latest Ref Range: 135 - 145 mEq/L 135  Potassium Latest Ref Range: 3.5 - 5.1 mEq/L 3.7  Chloride Latest Ref Range: 96 - 112 mEq/L 102  CO2 Latest Ref Range: 19 - 32 mEq/L 26  Glucose Latest Ref Range: 70 - 99 mg/dL 74  BUN Latest Ref Range: 6 - 23 mg/dL 7  Creatinine Latest Ref Range: 0.40 - 1.20 mg/dL 05/28/2021  Calcium Latest Ref Range: 8.4 - 10.5 mg/dL 9.1  Alkaline Phosphatase Latest Ref Range: 39 - 117 U/L 56  Albumin Latest Ref Range: 3.5 - 5.2 g/dL 3.8  AST Latest Ref Range: 0 - 37 U/L 16  ALT Latest Ref Range: 0 - 35 U/L 13  Total Protein Latest Ref Range: 6.0 - 8.3 g/dL 6.9  Total Bilirubin Latest Ref Range: 0.2 -  1.2 mg/dL 0.3  GFR Latest Ref Range: >60.00 mL/min 117.05  WBC Latest Ref Range: 4.0 - 10.5 K/uL 6.9  RBC Latest Ref Range: 3.87 - 5.11 Mil/uL 3.81 (L)  Hemoglobin Latest Ref Range: 12.0 - 15.0 g/dL 9.62 (L)  HCT Latest Ref Range: 36.0 - 46.0 % 34.4 (L)  MCV Latest Ref Range: 78.0 - 100.0 fl 90.3  MCHC Latest Ref Range: 30.0 - 36.0 g/dL 95.2  RDW Latest Ref Range: 11.5 - 15.5 % 13.1  Platelets Latest Ref Range: 150.0 - 400.0 K/uL 252.0  Neutrophils Latest Ref Range: 43.0 - 77.0 % 69.9  Lymphocytes Latest Ref Range: 12.0 - 46.0 % 21.9  Monocytes Relative Latest Ref Range: 3.0 - 12.0 % 6.7  Eosinophil Latest Ref Range: 0.0 - 5.0 % 1.0  Basophil Latest Ref Range: 0.0 - 3.0 % 0.5  NEUT# Latest Ref Range: 1.4 - 7.7 K/uL 4.8  Lymphocyte # Latest Ref Range: 0.7 - 4.0 K/uL 1.5  Monocyte # Latest Ref Range: 0.1 - 1.0 K/uL 0.5  Eosinophils Absolute Latest Ref Range: 0.0 - 0.7 K/uL 0.1  Basophils Absolute Latest Ref Range: 0.0 - 0.1 K/uL 0.0  TSH Latest Ref Range: 0.35 - 4.50 uIU/mL 0.70  Triiodothyronine (T3) Latest Ref Range: 76 - 181 ng/dL 185 (H)  Thyroxine (T4) Latest Ref Range: 5.1 - 11.9 mcg/dL 63.1 (H)      ASSESSMENT/PLAN/RECOMMENDATIONS:   1. Hyperthyroidism during pregnancy:  -We discussed differential diagnosis of Graves' disease which is the most likely diagnosis in this case, versus subacute thyroiditis versus toxic thyroid nodule. -She is clinically euthyroid -She has no local neck symptoms The goal of treatment is to maintain persistent but mild hyperthyroidism in the mother in an attempt to prevent fetal hypothyroidism since the fetal thyroid is more sensitive to the action of thionamide therapy. Overtreatment of maternal hyperthyroidism can cause fetal goiter and primary hypothyroidism.  -Based on repeat TFTs results today no intervention is needed we will continue to monitor we will repeat lab work in 4 weeks   Follow-up in 2 months Labs in 4 weeks  Signed  electronically by: Lyndle Herrlich, MD  [facility] Endocrinology  [facility] [address] [place], [place] [address] Phone: [phone] FAX: [phone]   CC: Carla Miyamoto, MD [address] [place] [address] Phone: [phone] Fax: [phone]   Return to Endocrinology clinic as below: Future Appointments  Date Time Provider Department Center  03/28/2021 11:30 AM Zanyia Silbaugh, Konrad Dolores, MD [facility] None  04/15/2021  9:00 AM Gwyndolyn Saxon, LCSW [facility] None  04/19/2021  9:15 AM Willodean Rosenthal, MD [facility] None  05/03/2021  8:00 AM [place] Edwena Blow, PA-C [facility]  05/16/2021  8:15 AM Levie Heritage, DO [facility] None  05/22/2021  9:00 AM [facility] NURSE [facility]  05/22/2021  9:15 AM [facility] US2 [facility]  05/31/2021  1:20 PM Tobb, Lavona Mound, DO [facility] None

## 2021-03-31 LAB — T4: T4, Total: 12.6 ug/dL — ABNORMAL HIGH (ref 5.1–11.9)

## 2021-03-31 LAB — T3: T3, Total: 199 ng/dL — ABNORMAL HIGH (ref 76–181)

## 2021-03-31 LAB — TRAB (TSH RECEPTOR BINDING ANTIBODY): TRAB: <1 IU/L (ref <=2.00)

## 2021-04-03 ENCOUNTER — Telehealth: Payer: Self-pay

## 2021-04-03 NOTE — Telephone Encounter (Signed)
Pt called stating someone called the office from [employer] regarding her breast pump order. Pt made aware that we have not received a call from [employer]. Pt states she will call [employer] and have them call the office back. Neale Marzette l Mica Releford, CMA

## 2021-04-10 ENCOUNTER — Inpatient Hospital Stay (HOSPITAL_COMMUNITY)
Admission: AD | Admit: 2021-04-10 | Discharge: 2021-04-11 | Disposition: A | Discharge status: Home / Self Care | Payer: Medicaid Other | Attending: Obstetrics & Gynecology | Admitting: Obstetrics & Gynecology

## 2021-04-10 ENCOUNTER — Inpatient Hospital Stay (HOSPITAL_COMMUNITY): Payer: Medicaid Other

## 2021-04-10 ENCOUNTER — Encounter (HOSPITAL_COMMUNITY): Payer: Self-pay | Admitting: Obstetrics & Gynecology

## 2021-04-10 ENCOUNTER — Telehealth: Payer: Self-pay

## 2021-04-10 DIAGNOSIS — M5431 Sciatica, right side: Secondary | ICD-10-CM

## 2021-04-10 DIAGNOSIS — M5432 Sciatica, left side: Secondary | ICD-10-CM

## 2021-04-10 DIAGNOSIS — O26892 Other specified pregnancy related conditions, second trimester: Secondary | ICD-10-CM | POA: Diagnosis not present

## 2021-04-10 DIAGNOSIS — O99512 Diseases of the respiratory system complicating pregnancy, second trimester: Secondary | ICD-10-CM | POA: Diagnosis not present

## 2021-04-10 DIAGNOSIS — Z3A24 24 weeks gestation of pregnancy: Secondary | ICD-10-CM | POA: Insufficient documentation

## 2021-04-10 DIAGNOSIS — O26893 Other specified pregnancy related conditions, third trimester: Secondary | ICD-10-CM | POA: Diagnosis not present

## 2021-04-10 DIAGNOSIS — Z20822 Contact with and (suspected) exposure to covid-19: Secondary | ICD-10-CM | POA: Diagnosis not present

## 2021-04-10 DIAGNOSIS — M79606 Pain in leg, unspecified: Secondary | ICD-10-CM | POA: Insufficient documentation

## 2021-04-10 DIAGNOSIS — Z7951 Long term (current) use of inhaled steroids: Secondary | ICD-10-CM | POA: Diagnosis not present

## 2021-04-10 DIAGNOSIS — J45909 Unspecified asthma, uncomplicated: Secondary | ICD-10-CM | POA: Insufficient documentation

## 2021-04-10 DIAGNOSIS — Z87891 Personal history of nicotine dependence: Secondary | ICD-10-CM | POA: Diagnosis not present

## 2021-04-10 DIAGNOSIS — Z79899 Other long term (current) drug therapy: Secondary | ICD-10-CM | POA: Diagnosis not present

## 2021-04-10 HISTORY — DX: Anemia, unspecified: D64.9

## 2021-04-10 LAB — URINALYSIS, ROUTINE W REFLEX MICROSCOPIC
Bilirubin Urine: NEGATIVE
Glucose, UA: NEGATIVE mg/dL
Hgb urine dipstick: NEGATIVE
Ketones, ur: NEGATIVE mg/dL
Leukocytes,Ua: NEGATIVE
Nitrite: NEGATIVE
Protein, ur: NEGATIVE mg/dL
Specific Gravity, Urine: 1.01 (ref 1.005–1.030)
pH: 7 (ref 5.0–8.0)

## 2021-04-10 NOTE — MAU Note (Signed)
For about 5 days has had some burning and tingling in hips and thighs.  Each day, seems to get worse, now down to knees.  Pain now in hips, and behind knees- kind of a pinching.  Getting worse at night. Has been feeling pelvic pressure and some contractions, not consistent- but pretty intense.  Has tried to increase water intake.  Hands and feet do tingle and swell, been going on for about 2-3 wks.

## 2021-04-10 NOTE — Telephone Encounter (Signed)
Pt sent Mychart message stating she is having pain in her arms and legs and the pain is worse at night. She states Flexeril or tylenol does not provide any relief. Advised pt to go to [facility] to be seen if the pain persits. Understanding was voiced. Bryah Ocheltree l Karel Turpen, CMA

## 2021-04-10 NOTE — MAU Provider Note (Signed)
Chief Complaint:  Contractions, Pelvic Pain, Hip Pain, and Leg Pain   Event Date/Time   First Provider Initiated Contact with Patient 04/10/21 2014      HPI: Carla [name] is a 30 y.o. [id number] at [redacted]w[redacted]d by early ultrasound who presents to maternity admissions reporting pain in both hips, burning/tingling pain down both legs to her knees x 2 weeks that is getting worse, and is more severe x 5 days, and painful abdominal cramping 3-4 times per hour.  She had right arm/neck pain 12/13/20 and was diagnosed with cervical radiculopathy with plans to follow up with neurology postpartum.  This improved with a course of oral steroids.    The hip/leg pain started mildly 2 weeks ago, worse in the last 5 days, increasing in intensity since onset. She denies any functional deficits, loss of balance, vision changes, or weakness.  There is burning/tingling/pinching pain sometimes in one leg but usually both legs from her hips down to her knees.  The pain is in the front and back of her legs and even when it is gone there is a mild soreness that remains.  She has tried heat, ice, Tylenol, stretching, use of pillows in bed but nothing is helping. It wakes her up from sleep.  There are no other related symptoms, no fever, respiratory symptoms, no urinary or bowel symptoms.   Her cramping is irregular, and occurs 3-4 times per hour but is intense and causes pelvic pressure. She did not have this with her other children. There are no other related symptoms, no leaking fluid, bleeding, or vaginal discharge.    She reports good fetal movement.    HPI  Past Medical History: Past Medical History:  Diagnosis Date  . ADD (attention deficit disorder)   . Anemia   . Asthma   . Bronchitis   . Eczema   . Hyperthyroidism   . Insomnia   . Migraine   . Recurrent upper respiratory infection (URI)   . Urticaria     Past obstetric history: OB History  Gravida Para Term Preterm AB Living  5 2 2   2 2   SAB IAB  Ectopic Multiple Live Births  1 1     2     # Outcome Date GA Lbr Len/2nd Weight Sex Delivery Anes PTL Lv  5 Current           4 SAB 07/2020          3 IAB 04/2020          2 Term 06/18/11    M Vag-Spont   LIV  1 Term 11/27/09    F Vag-Spont   LIV    Past Surgical History: Past Surgical History:  Procedure Laterality Date  . ADENOIDECTOMY    . NASAL SEPTOPLASTY W/ TURBINOPLASTY  2017  . SINOSCOPY    . TONSILLECTOMY      Family History: Family History  Problem Relation Age of Onset  . Hypertension Mother   . Hypertension Father   . Diabetes Father   . Diabetes Brother   . Diabetes Maternal Grandmother   . Stroke Maternal Grandmother   . Diabetes Paternal Grandfather   . Heart disease Neg Hx     Social History: Social History   Tobacco Use  . Smoking status: Former Smoker    Packs/day: 0.25    Years: 4.00    Pack years: 1.00    Types: Cigarettes    Quit date: 08/18/2018    Years since quitting:  2.6  . Smokeless tobacco: Never Used  . Tobacco comment: patient quit 2 weeks ago. 09-01-18  Vaping Use  . Vaping Use: Never used  Substance Use Topics  . Alcohol use: Never  . Drug use: Never    Allergies:  Allergies  Allergen Reactions  . Latex Hives    Meds:  Medications Prior to Admission  Medication Sig Dispense Refill Last Dose  . Ascorbic Acid (VITAMIN C) 1000 MG tablet Take 1,000 mg by mouth daily.   Past Week at Unknown time  . cholecalciferol (VITAMIN D3) 25 MCG (1000 UNIT) tablet Take 1,000 Units by mouth daily.   Past Week at Unknown time  . cyclobenzaprine (FLEXERIL) 10 MG tablet Take 0.5-1 tablets (5-10 mg total) by mouth 3 (three) times daily as needed (headache). 30 tablet 2 04/09/2021 at Unknown time  . ferrous sulfate (FERROUSUL) 325 (65 FE) MG tablet Take 1 tablet (325 mg total) by mouth every other day. 60 tablet 1 04/10/2021 at Unknown time  . Prenatal Vit-Fe Fumarate-FA (PRENATAL MULTIVITAMIN) TABS tablet Take 1 tablet by mouth daily at 12 noon.    Past Week at Unknown time  . scopolamine (TRANSDERM-SCOP, 1.5 MG,) 1 MG/3DAYS Place 1 patch (1.5 mg total) onto the skin every 3 (three) days. 10 patch 12 04/09/2021 at Unknown time  . albuterol (PROVENTIL HFA;VENTOLIN HFA) 108 (90 Base) MCG/ACT inhaler Inhale 2 puffs into the lungs every 6 (six) hours as needed for wheezing or shortness of breath.     . budesonide-formoterol (SYMBICORT) 160-4.5 MCG/ACT inhaler Inhale 2 puffs into the lungs 2 (two) times daily.     . ondansetron (ZOFRAN ODT) 4 MG disintegrating tablet Take 1 tablet (4 mg total) by mouth every 8 (eight) hours as needed for nausea or vomiting. 20 tablet 0   . promethazine (PHENERGAN) 12.5 MG tablet Take 1 tablet (12.5 mg total) by mouth every 6 (six) hours as needed for nausea or vomiting. 30 tablet 0     ROS:  Review of Systems  Constitutional: Negative for chills, fatigue and fever.  HENT: Negative for congestion.   Eyes: Negative for visual disturbance.  Respiratory: Negative for cough and shortness of breath.   Cardiovascular: Negative for chest pain.  Gastrointestinal: Positive for abdominal pain. Negative for nausea and vomiting.  Genitourinary: Positive for pelvic pain. Negative for difficulty urinating, dysuria, flank pain, vaginal bleeding, vaginal discharge and vaginal pain.  Musculoskeletal: Positive for myalgias.  Neurological: Negative for dizziness and headaches.       Tingling   Psychiatric/Behavioral: Negative.      I have reviewed patient's Past Medical Hx, Surgical Hx, Family Hx, Social Hx, medications and allergies.   Physical Exam   Patient Vitals for the past 24 hrs:  BP Temp Temp src Pulse Resp SpO2 Height Weight  04/10/21 1923 108/63 -- -- 91 -- -- -- --  04/10/21 1904 124/67 98 F (36.7 C) Oral 90 17 100 % 5\' 9"  (1.753 m) 71.5 kg   Constitutional: Well-developed, well-nourished female in no acute distress.  HEART: normal rate, heart sounds, regular rhythm RESP: normal effort, lung sounds  clear and equal bilaterally GI: Abd soft, non-tender, gravid appropriate for gestational age.  MS: Extremities nontender, no edema, normal ROM Neurologic: Alert and oriented x 4.  GU: Neg CVAT.   Dilation: Closed Effacement (%): Thick Cervical Position: Posterior Exam by:: L. Leftwich-Kirby CNM  FHT:  Baseline 145, moderate variability, accelerations present, no decelerations Contractions: None on toco or to palpation   Labs:  Results for orders placed or performed during the hospital encounter of 04/10/21 (from the past 24 hour(s))  Urinalysis, Routine w reflex microscopic Urine, Clean Catch     Status: None   Collection Time: 04/10/21  7:37 PM  Result Value Ref Range   Color, Urine YELLOW YELLOW   APPearance CLEAR CLEAR   Specific Gravity, Urine 1.010 1.005 - 1.030   pH 7.0 5.0 - 8.0   Glucose, UA NEGATIVE NEGATIVE mg/dL   Hgb urine dipstick NEGATIVE NEGATIVE   Bilirubin Urine NEGATIVE NEGATIVE   Ketones, ur NEGATIVE NEGATIVE mg/dL   Protein, ur NEGATIVE NEGATIVE mg/dL   Nitrite NEGATIVE NEGATIVE   Leukocytes,Ua NEGATIVE NEGATIVE  Resp Panel by RT-PCR (Flu A&B, Covid) Nasopharyngeal Swab     Status: None   Collection Time: 04/10/21 11:27 PM   Specimen: Nasopharyngeal Swab; Nasopharyngeal(NP) swabs in vial transport medium  Result Value Ref Range   SARS Coronavirus 2 by RT PCR NEGATIVE NEGATIVE   Influenza A by PCR NEGATIVE NEGATIVE   Influenza B by PCR NEGATIVE NEGATIVE   O/Positive/-- (01/31 1115)  Imaging:    MAU Course/MDM: Orders Placed This Encounter  Procedures  . Resp Panel by RT-PCR (Flu A&B, Covid) Nasopharyngeal Swab  . MR CERVICAL SPINE WO CONTRAST  . MR THORACIC SPINE WO CONTRAST  . Urinalysis, Routine w reflex microscopic Urine, Clean Catch  . Airborne and Contact precautions  . Discharge patient    Meds ordered this encounter  Medications  . ketorolac (TORADOL) injection 60 mg  . DISCONTD: predniSONE (DELTASONE) 20 MG tablet    Sig: Take 3  tablets (60 mg total) by mouth daily with breakfast for 7 days.    Dispense:  21 tablet    Refill:  0    Order Specific Question:   Supervising Provider    Answer:   Adam Phenix [[id number]]  . predniSONE (DELTASONE) 20 MG tablet    Sig: Take 3 tablets (60 mg total) by mouth daily with breakfast for 5 days.    Dispense:  15 tablet    Refill:  0    Order Specific Question:   Supervising Provider    Answer:   Adam Phenix [[id number]]     NST reviewed and appropriate for gestational age, with 15 x 15 accelerations No evidence of preterm labor with cervix closed/thick/high. FFN collected but not sent with closed cervix, no evidence of contractions on toco or to palpation. Pt without respiratory symptoms but given new onset body aches/leg pain, COVID swab collected and resulted negative.  Although differential dx includes sciatica, not as likely with pain in bilateral legs, and front and back of legs with tingling sensation. Pain is severe and waking pt up at night. Consult Dr Iver Nestle, neurologist on call, and reviewed pt presentation and findings Given new onset symptoms, and no clear pregnancy or musculoskeletal explanation, she recommends imaging tonight, MRI of cervical and thoracic spine without contrast.  If normal, pt for close outpatient follow up.  Offered sedation with Vistaril but pt driving so not given  MRI results for cervical and thoracic spine are normal Offered Toradol injection in MAU, discussed safety of short course of NSAIDs in second trimester. Rx for Prednisone 60 mg QD x 5 days Tylenol OTC PRN F/U in office next week as scheduled, return to MAU with worsening symptoms or emergencies   Assessment: 1. Bilateral sciatica   2. Pregnancy related leg pain, antepartum, third trimester   3. [redacted] weeks gestation of pregnancy  Plan: Discharge home Labor precautions and fetal kick counts  Follow-up Information    [facility] Follow up.    Specialty: Obstetrics and Gynecology Why: As scheduled, return to MAU as needed for emergencies. Contact information: [address] [place] [address] [phone]             Allergies as of 04/11/2021      Reactions   Latex Hives      Medication List    TAKE these medications   albuterol 108 (90 Base) MCG/ACT inhaler Commonly known as: VENTOLIN HFA Inhale 2 puffs into the lungs every 6 (six) hours as needed for wheezing or shortness of breath.   budesonide-formoterol 160-4.5 MCG/ACT inhaler Commonly known as: SYMBICORT Inhale 2 puffs into the lungs 2 (two) times daily.   cholecalciferol 25 MCG (1000 UNIT) tablet Commonly known as: VITAMIN D3 Take 1,000 Units by mouth daily.   cyclobenzaprine 10 MG tablet Commonly known as: FLEXERIL Take 0.5-1 tablets (5-10 mg total) by mouth 3 (three) times daily as needed (headache).   ferrous sulfate 325 (65 FE) MG tablet Commonly known as: FerrouSul Take 1 tablet (325 mg total) by mouth every other day.   ondansetron 4 MG disintegrating tablet Commonly known as: Zofran ODT Take 1 tablet (4 mg total) by mouth every 8 (eight) hours as needed for nausea or vomiting.   predniSONE 20 MG tablet Commonly known as: DELTASONE Take 3 tablets (60 mg total) by mouth daily with breakfast for 5 days.   prenatal multivitamin Tabs tablet Take 1 tablet by mouth daily at 12 noon.   promethazine 12.5 MG tablet Commonly known as: PHENERGAN Take 1 tablet (12.5 mg total) by mouth every 6 (six) hours as needed for nausea or vomiting.   scopolamine 1 MG/3DAYS Commonly known as: Transderm-Scop (1.5 MG) Place 1 patch (1.5 mg total) onto the skin every 3 (three) days.   vitamin C 1000 MG tablet Take 1,000 mg by mouth daily.       Sharen Counter Certified Nurse-Midwife 04/11/2021 12:59 AM

## 2021-04-11 DIAGNOSIS — O26893 Other specified pregnancy related conditions, third trimester: Secondary | ICD-10-CM | POA: Diagnosis present

## 2021-04-11 DIAGNOSIS — M79606 Pain in leg, unspecified: Secondary | ICD-10-CM | POA: Diagnosis present

## 2021-04-11 LAB — RESP PANEL BY RT-PCR (FLU A&B, COVID) ARPGX2
Influenza A by PCR: NEGATIVE
Influenza B by PCR: NEGATIVE
SARS Coronavirus 2 by RT PCR: NEGATIVE

## 2021-04-11 MED ORDER — KETOROLAC TROMETHAMINE 60 MG/2ML IM SOLN
60.0000 mg | Freq: Once | INTRAMUSCULAR | Status: AC
  Administered 2021-04-11: 60 mg via INTRAMUSCULAR
  Filled 2021-04-11: qty 2

## 2021-04-11 MED ORDER — PREDNISONE 20 MG PO TABS: 60.0000 mg | ORAL_TABLET | Freq: Every day | ORAL | 0 refills | Status: AC

## 2021-04-11 MED ORDER — PREDNISONE 20 MG PO TABS: 60.0000 mg | ORAL_TABLET | Freq: Every day | ORAL | 0 refills | Status: DC

## 2021-04-12 ENCOUNTER — Other Ambulatory Visit: Payer: Self-pay

## 2021-04-12 DIAGNOSIS — M5432 Sciatica, left side: Secondary | ICD-10-CM

## 2021-04-12 DIAGNOSIS — M5431 Sciatica, right side: Secondary | ICD-10-CM

## 2021-04-15 ENCOUNTER — Telehealth: Payer: Self-pay | Admitting: Licensed Clinical Social Worker

## 2021-04-15 ENCOUNTER — Ambulatory Visit (INDEPENDENT_AMBULATORY_CARE_PROVIDER_SITE_OTHER): Payer: Medicaid Other | Admitting: Licensed Clinical Social Worker

## 2021-04-15 DIAGNOSIS — F4322 Adjustment disorder with anxiety: Secondary | ICD-10-CM

## 2021-04-15 NOTE — Telephone Encounter (Signed)
Called pt twice at 8:57am and 9:08am, text message pt mychart link to cell phone. Pt did not sign on to mychart video.

## 2021-04-16 NOTE — BH Specialist Note (Signed)
Integrated Behavioral Health Follow Up In-Person Visit  MRN: [id number] Name: Carla [name] [facility]  Number of Integrated Behavioral Health Clinician visits: 2/6 Session Start time: 9:20pm  Session End time: 9:32pm Total time: 12 minutes via mychart due to pt attending appt late   Types of Service: General Integrated Behavioral Health   Interpretor:no  Interpretor Name and Language: none  Subjective: Carla [name] is a 30 y.o. female accompanied by m/a Patient was referred by Terence Lux CMA for anxious mood. Patient reports the following symptoms/concerns: improvement with support and mood  Duration of problem: approx 4 weeks; Severity of problem: mild  Objective: Mood: good  and Affect: appropriate  Risk of harm to self or others: No risk of harm to self or others.   Life Context: Family and Social: Lives with FOB and two older children  School/Work: Works at home  Self-Care: Prioritizing rest  Life Changes: New Pregnancy  Patient and/or Family's Strengths/Protective Factors: Secured connection in place such as support and secured housing   Goals Addressed: Patient will: 1.  Reduce symptoms of: stress   2.  Increase knowledge and/or ability of: healthy habits and coping skills   3.  Demonstrate ability to: self manage symptoms   Progress towards Goals: Ongoing   Interventions: Interventions utilized:  Supportive counseling  Standardized Assessments completed:  Psychologist, occupational Health from 03/25/2021 in [facility]  PHQ-9 Total Score 10     Assessment: Patient currently experiencing adjustment disorder with anxious mood   Patient may benefit from integrated behavioral health   Plan: 1. Follow up with behavioral health clinician on : as needed  1. Behavioral recommendations: Communicate needs to partner, create smart goals, aromatherapy and sleep hygiene for improved sleep habits, delegate task to prevent burnout   2.  3. Referral(s): n/a 4. "From scale of 1-10, how likely are you to follow plan?":   Gwyndolyn Saxon, LCSW

## 2021-04-18 ENCOUNTER — Other Ambulatory Visit: Payer: Self-pay

## 2021-04-18 ENCOUNTER — Encounter: Payer: Self-pay | Admitting: Obstetrics & Gynecology

## 2021-04-18 ENCOUNTER — Ambulatory Visit (INDEPENDENT_AMBULATORY_CARE_PROVIDER_SITE_OTHER): Payer: Medicaid Other | Admitting: Obstetrics & Gynecology

## 2021-04-18 VITALS — BP 113/59 | HR 85 | Wt 160.0 lb

## 2021-04-18 DIAGNOSIS — Z3480 Encounter for supervision of other normal pregnancy, unspecified trimester: Secondary | ICD-10-CM

## 2021-04-18 DIAGNOSIS — O99019 Anemia complicating pregnancy, unspecified trimester: Secondary | ICD-10-CM

## 2021-04-18 DIAGNOSIS — E0590 Thyrotoxicosis, unspecified without thyrotoxic crisis or storm: Secondary | ICD-10-CM

## 2021-04-18 DIAGNOSIS — M5410 Radiculopathy, site unspecified: Secondary | ICD-10-CM

## 2021-04-18 DIAGNOSIS — R87612 Low grade squamous intraepithelial lesion on cytologic smear of cervix (LGSIL): Secondary | ICD-10-CM

## 2021-04-18 DIAGNOSIS — Z3A25 25 weeks gestation of pregnancy: Secondary | ICD-10-CM

## 2021-04-18 DIAGNOSIS — O99280 Endocrine, nutritional and metabolic diseases complicating pregnancy, unspecified trimester: Secondary | ICD-10-CM

## 2021-04-18 DIAGNOSIS — Z8659 Personal history of other mental and behavioral disorders: Secondary | ICD-10-CM

## 2021-04-18 NOTE — Progress Notes (Signed)
+   Fetal movement. Pt states she is still having numbness and tingling in the hips.

## 2021-04-18 NOTE — Progress Notes (Signed)
PRENATAL VISIT NOTE  Subjective:  Carla [name] is a 30 y.o. [id number] at [redacted]w[redacted]d being seen today for ongoing prenatal care.  She is currently monitored for the following issues for this low-risk pregnancy and has Cervical radiculopathy; Slow transit constipation; Supervision of other normal pregnancy, antepartum; Hyperthyroidism affecting pregnancy, antepartum; Anemia, antepartum; Abnormal Pap smear of cervix; History of depression; Urticaria; Recurrent upper respiratory infection (URI); Migraine; Insomnia; Hyperthyroidism; Eczema; Bronchitis; Asthma; ADD (attention deficit disorder); Old vaginal laceration; Lightheadedness; Atypical chest pain; Orthostatic hypotension; and Pregnancy related leg pain, antepartum, third trimester on their problem list. Pt reports that she has some relief from the pain in her back and legs following the steroids but, it is not gone.   Per the APP in the MAU:   She came in to MAU on 5/18 with severe hip/thigh pain with tingling and numbness. I consulted neurology because it was strange and she had me MRI the patient to rule out lesions/masses and the MRI was normal. 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 Here's the link she sent: [email or web address].   I sent the info to the patient via MyChart. This patient has a low BMI and has not gained an excessive amount of weight but this dx fits her symptoms well. I gave her Toradol and send her home with a prednisone burst so she may be improved, but I wanted to pass along Dr Rollene Fare suggestion before you see her in the office.    She has an appt to see neuro but, the appt isnt until Sept. She is on a waitlist and she calls every 3days to see if she can get in sooner.  Patient reports Denies leaking of fluid.   The following portions of the  patient's history were reviewed and updated as appropriate: allergies, current medications, past family history, past medical history, past social history, past surgical history and problem list.   Objective:  There were no vitals filed for this visit.  Fetal Status:           General:  Alert, oriented and cooperative. Patient is in no acute distress.  Skin: Skin is warm and dry. No rash noted.   Cardiovascular: Normal heart rate noted  Respiratory: Normal respiratory effort, no problems with respiration noted  Abdomen: Soft, gravid, appropriate for gestational age.        Pelvic: Cervical exam performed in the presence of a chaperone        Extremities: Normal range of motion.     Mental Status: Normal mood and affect. Normal behavior. Normal judgment and thought content.   Assessment and Plan:  Pregnancy: [id number] at [redacted]w[redacted]d 1. [redacted] weeks gestation of pregnancy FH and FHR WNL   - RPR - CBC - Glucose Tolerance, 2 Hours w/1 Hour - HIV Antibody (routine testing w rflx)  2. Supervision of other normal pregnancy, antepartum  3. History of depression stable   4. Hyperthyroidism affecting pregnancy, antepartum Stable  Followed by endocrine  5. Anemia, antepartum   6. Low grade squamous intraepithelial lesion on cytologic smear of cervix (LGSIL) Needs repeat PAP  7. Radiculopathy Waiting for referral for Neuro Will also refer to PT  8. H/o 4th degree perineal lac Pt was advised to deliver via c-section  We discussed the risks vs benefits of a c/s vs a vaginal delivery.  Pt would like to proceed with a vaginal delivery. If the growth becomes much over  her prior pregnancy she is willing to reconsider.    Preterm labor symptoms and general obstetric precautions including but not limited to vaginal bleeding, contractions, leaking of fluid and fetal movement were reviewed in detail with the patient. Please refer to After Visit Summary for other counseling recommendations.   No  follow-ups on file.  Future Appointments  Date Time Provider Department Center  04/18/2021 10:30 AM Willodean Rosenthal, MD [facility] None  04/26/2021  8:45 AM [facility] None  05/03/2021  8:00 AM Teague Edwena Blow, PA-C [facility]  05/16/2021  8:15 AM Levie Heritage, DO [facility] None  05/22/2021  9:00 AM [facility] NURSE [facility]  05/22/2021  9:15 AM [facility] US2 [facility]  05/24/2021 10:10 AM Shamleffer, Konrad Dolores, MD [facility] None  05/31/2021  1:20 PM Thomasene Ripple, DO [facility] None  07/09/2021  3:30 PM Levert Feinstein, MD [facility] None    Willodean Rosenthal, MD

## 2021-04-19 ENCOUNTER — Encounter: Payer: Medicaid Other | Admitting: Obstetrics & Gynecology

## 2021-04-19 LAB — GLUCOSE TOLERANCE, 2 HOURS W/ 1HR
Glucose, 1 hour: 80 mg/dL (ref 65–179)
Glucose, 2 hour: 80 mg/dL (ref 65–152)
Glucose, Fasting: 75 mg/dL (ref 65–91)

## 2021-04-19 LAB — CBC
Hematocrit: 30.8 % — ABNORMAL LOW (ref 34.0–46.6)
Hemoglobin: 10.3 g/dL — ABNORMAL LOW (ref 11.1–15.9)
MCH: 29.7 pg (ref 26.6–33.0)
MCHC: 33.4 g/dL (ref 31.5–35.7)
MCV: 89 fL (ref 79–97)
Platelets: 264 10*3/uL (ref 150–450)
RBC: 3.47 x10E6/uL — ABNORMAL LOW (ref 3.77–5.28)
RDW: 12.2 % (ref 11.7–15.4)
WBC: 6 10*3/uL (ref 3.4–10.8)

## 2021-04-19 LAB — RPR: RPR Ser Ql: NONREACTIVE

## 2021-04-19 LAB — HIV ANTIBODY (ROUTINE TESTING W REFLEX): HIV Screen 4th Generation wRfx: NONREACTIVE

## 2021-04-26 ENCOUNTER — Other Ambulatory Visit (INDEPENDENT_AMBULATORY_CARE_PROVIDER_SITE_OTHER): Payer: Medicaid Other

## 2021-04-26 ENCOUNTER — Other Ambulatory Visit: Payer: Self-pay

## 2021-04-26 DIAGNOSIS — E0590 Thyrotoxicosis, unspecified without thyrotoxic crisis or storm: Secondary | ICD-10-CM

## 2021-04-26 LAB — TSH: TSH: 1.01 u[IU]/mL (ref 0.35–4.50)

## 2021-04-27 LAB — T3: T3, Total: 200 ng/dL — ABNORMAL HIGH (ref 76–181)

## 2021-04-27 LAB — T4: T4, Total: 15 ug/dL — ABNORMAL HIGH (ref 5.1–11.9)

## 2021-04-29 ENCOUNTER — Telehealth: Payer: Self-pay

## 2021-04-29 DIAGNOSIS — L309 Dermatitis, unspecified: Secondary | ICD-10-CM | POA: Insufficient documentation

## 2021-04-29 DIAGNOSIS — J069 Acute upper respiratory infection, unspecified: Secondary | ICD-10-CM | POA: Insufficient documentation

## 2021-04-29 DIAGNOSIS — J40 Bronchitis, not specified as acute or chronic: Secondary | ICD-10-CM | POA: Insufficient documentation

## 2021-04-29 DIAGNOSIS — F988 Other specified behavioral and emotional disorders with onset usually occurring in childhood and adolescence: Secondary | ICD-10-CM | POA: Insufficient documentation

## 2021-04-29 DIAGNOSIS — G4700 Insomnia, unspecified: Secondary | ICD-10-CM | POA: Insufficient documentation

## 2021-04-29 DIAGNOSIS — L509 Urticaria, unspecified: Secondary | ICD-10-CM | POA: Insufficient documentation

## 2021-04-29 DIAGNOSIS — G43909 Migraine, unspecified, not intractable, without status migrainosus: Secondary | ICD-10-CM | POA: Insufficient documentation

## 2021-04-29 DIAGNOSIS — J45909 Unspecified asthma, uncomplicated: Secondary | ICD-10-CM | POA: Insufficient documentation

## 2021-04-29 DIAGNOSIS — E0590 Thyrotoxicosis, unspecified without thyrotoxic crisis or storm: Secondary | ICD-10-CM | POA: Insufficient documentation

## 2021-04-29 NOTE — Telephone Encounter (Signed)
Pt called stating that she is having congestion, sore throat and ear pain for 8 days. Pt has had two negative covid tests. Pt has taken Mucinex, Robitussin and Sudafed and states these medications have not helped.Pt states now everyone in her house has gotten sick too. I told pt to go be seen by urgent care or PCP to be testing for covid, strep and have her ears checked for ear infection.  Pt expressed understanding.

## 2021-05-01 ENCOUNTER — Ambulatory Visit: Payer: Medicaid Other | Admitting: Physical Therapy

## 2021-05-03 ENCOUNTER — Encounter: Payer: Self-pay | Admitting: Physician Assistant

## 2021-05-03 ENCOUNTER — Other Ambulatory Visit: Payer: Self-pay

## 2021-05-03 ENCOUNTER — Ambulatory Visit (INDEPENDENT_AMBULATORY_CARE_PROVIDER_SITE_OTHER): Payer: Medicaid Other | Admitting: Physician Assistant

## 2021-05-03 VITALS — BP 103/64 | HR 83

## 2021-05-03 DIAGNOSIS — R519 Headache, unspecified: Secondary | ICD-10-CM

## 2021-05-03 DIAGNOSIS — G43701 Chronic migraine without aura, not intractable, with status migrainosus: Secondary | ICD-10-CM | POA: Diagnosis not present

## 2021-05-03 DIAGNOSIS — O26892 Other specified pregnancy related conditions, second trimester: Secondary | ICD-10-CM

## 2021-05-03 MED ORDER — PROMETHAZINE HCL 25 MG PO TABS: 25.0000 mg | ORAL_TABLET | Freq: Four times a day (QID) | ORAL | 0 refills | Status: DC | PRN

## 2021-05-03 MED ORDER — BUTALBITAL-APAP-CAFFEINE 50-325-40 MG PO CAPS: 1.0000 | ORAL_CAPSULE | Freq: Four times a day (QID) | ORAL | 3 refills | Status: DC | PRN

## 2021-05-03 MED ORDER — CYCLOBENZAPRINE HCL 10 MG PO TABS: 10.0000 mg | ORAL_TABLET | Freq: Three times a day (TID) | ORAL | 2 refills | Status: DC | PRN

## 2021-05-03 NOTE — Patient Instructions (Signed)
Migraine Headache A migraine headache is an intense, throbbing pain on one side or both sides of the head. Migraine headaches may also cause other symptoms, such as nausea, vomiting, and sensitivity to light and noise. A migraine headache can last from 4 hours to 3 days. Talk with your doctor about what things may bring on (trigger) your migraine headaches. What are the causes? The exact cause of this condition is not known. However, a migraine may be caused when nerves in the brain become irritated and release chemicals that cause inflammation of blood vessels. This inflammation causes pain. This condition may be triggered or caused by: Drinking alcohol. Smoking. Taking medicines, such as: Medicine used to treat chest pain (nitroglycerin). Birth control pills. Estrogen. Certain blood pressure medicines. Eating or drinking products that contain nitrates, glutamate, aspartame, or tyramine. Aged cheeses, chocolate, or caffeine may also be triggers. Doing physical activity. Other things that may trigger a migraine headache include: Menstruation. Pregnancy. Hunger. Stress. Lack of sleep or too much sleep. Weather changes. Fatigue. What increases the risk? The following factors may make you more likely to experience migraine headaches: Being a certain age. This condition is more common in people who are 25-55 years old. Being female. Having a family history of migraine headaches. Being Caucasian. Having a mental health condition, such as depression or anxiety. Being obese. What are the signs or symptoms? The main symptom of this condition is pulsating or throbbing pain. This pain may: Happen in any area of the head, such as on one side or both sides. Interfere with daily activities. Get worse with physical activity. Get worse with exposure to bright lights or loud noises. Other symptoms may include: Nausea. Vomiting. Dizziness. General sensitivity to bright lights, loud noises, or  smells. Before you get a migraine headache, you may get warning signs (an aura). An aura may include: Seeing flashing lights or having blind spots. Seeing bright spots, halos, or zigzag lines. Having tunnel vision or blurred vision. Having numbness or a tingling feeling. Having trouble talking. Having muscle weakness. Some people have symptoms after a migraine headache (postdromal phase), such as: Feeling tired. Difficulty concentrating. How is this diagnosed? A migraine headache can be diagnosed based on: Your symptoms. A physical exam. Tests, such as: CT scan or an MRI of the head. These imaging tests can help rule out other causes of headaches. Taking fluid from the spine (lumbar puncture) and analyzing it (cerebrospinal fluid analysis, or CSF analysis). How is this treated? This condition may be treated with medicines that: Relieve pain. Relieve nausea. Prevent migraine headaches. Treatment for this condition may also include: Acupuncture. Lifestyle changes like avoiding foods that trigger migraine headaches. Biofeedback. Cognitive behavioral therapy. Follow these instructions at home: Medicines Take over-the-counter and prescription medicines only as told by your health care provider. Ask your health care provider if the medicine prescribed to you: Requires you to avoid driving or using heavy machinery. Can cause constipation. You may need to take these actions to prevent or treat constipation: Drink enough fluid to keep your urine pale yellow. Take over-the-counter or prescription medicines. Eat foods that are high in fiber, such as beans, whole grains, and fresh fruits and vegetables. Limit foods that are high in fat and processed sugars, such as fried or sweet foods. Lifestyle Do not drink alcohol. Do not use any products that contain nicotine or tobacco, such as cigarettes, e-cigarettes, and chewing tobacco. If you need help quitting, ask your health care  provider. Get at least 8   hours of sleep every night. Find ways to manage stress, such as meditation, deep breathing, or yoga. General instructions   Keep a journal to find out what may trigger your migraine headaches. For example, write down: What you eat and drink. How much sleep you get. Any change to your diet or medicines. If you have a migraine headache: Avoid things that make your symptoms worse, such as bright lights. It may help to lie down in a dark, quiet room. Do not drive or use heavy machinery. Ask your health care provider what activities are safe for you while you are experiencing symptoms. Keep all follow-up visits as told by your health care provider. This is important. Contact a health care provider if: You develop symptoms that are different or more severe than your usual migraine headache symptoms. You have more than 15 headache days in one month. Get help right away if: Your migraine headache becomes severe. Your migraine headache lasts longer than 72 hours. You have a fever. You have a stiff neck. You have vision loss. Your muscles feel weak or like you cannot control them. You start to lose your balance often. You have trouble walking. You faint. You have a seizure. Summary A migraine headache is an intense, throbbing pain on one side or both sides of the head. Migraines may also cause other symptoms, such as nausea, vomiting, and sensitivity to light and noise. This condition may be treated with medicines and lifestyle changes. You may also need to avoid certain things that trigger a migraine headache. Keep a journal to find out what may trigger your migraine headaches. Contact your health care provider if you have more than 15 headache days in a month or you develop symptoms that are different or more severe than your usual migraine headache symptoms. This information is not intended to replace advice given to you by your health care provider. Make sure you  discuss any questions you have with your health care provider. Document Revised: 03/04/2019 Document Reviewed: 12/23/2018 Elsevier Patient Education  2022 Elsevier Inc.  

## 2021-05-03 NOTE — Progress Notes (Signed)
History:  Carla [name] is a 30 y.o. [phone] who presents to clinic today for headache evaluation.  She began having migraines at the age of 57.  They improved around 9 years ago and then worsened again 3 years ago, no known factor causing it to improve or worsen.  She currently has a headache 100% of the time.  They get to be severe, located one side in the temples and eyes, there is throbbing. May be located back of the right head.  Lights, noises, smells and movement are all a problem.  Nausea and vomiting are a problem.  Vomiting may make it better.  Prior to the headache, she may notice nose sensitvity.   Tylenol, motrin, ibuprofen, caffeine, maxalt, nurtec.  Maxalt was helpful for many years and then became less effective.  Nurtec worked the first couple of times and then stopped.  She has gotten injections to treat.  She tries to drink gatorade, rest, ice to help.  She developed hyperthyroid around 7 years ago.  Shamleffer is monitoring this.  Currently no meds required.  HIT6:68 Number of days in the last 4 weeks with:  Severe headache: 14 Moderate headache: 7 Mild headache: 7  No headache: 0   Past Medical History:  Diagnosis Date   ADD (attention deficit disorder)    Anemia    Asthma    Bronchitis    Eczema    Hyperthyroidism    Insomnia    Migraine    Recurrent upper respiratory infection (URI)    Urticaria     Social History   Socioeconomic History   Marital status: Single    Spouse name: Not on file   Number of children: 2   Years of education: Not on file   Highest education level: Some college, no degree  Occupational History   Not on file  Tobacco Use   Smoking status: Former    Packs/day: 0.25    Years: 4.00    Pack years: 1.00    Types: Cigarettes    Quit date: 08/18/2018    Years since quitting: 2.7   Smokeless tobacco: Never   Tobacco comments:    patient quit 2 weeks ago. 09-01-18  Vaping Use   Vaping Use: Never used  Substance and  Sexual Activity   Alcohol use: Never   Drug use: Never   Sexual activity: Yes  Other Topics Concern   Not on file  Social History Narrative   Not on file   Social Determinants of Health   Financial Resource Strain: Not on file  Food Insecurity: Not on file  Transportation Needs: Not on file  Physical Activity: Not on file  Stress: Not on file  Social Connections: Not on file  Intimate Partner Violence: Not on file    Family History  Problem Relation Age of Onset   Hypertension Mother    Hypertension Father    Diabetes Father    Diabetes Brother    Diabetes Maternal Grandmother    Stroke Maternal Grandmother    Diabetes Paternal Grandfather    Heart disease Neg Hx     Allergies  Allergen Reactions   Latex Hives    Current Outpatient Medications on File Prior to Visit  Medication Sig Dispense Refill   albuterol (PROVENTIL HFA;VENTOLIN HFA) 108 (90 Base) MCG/ACT inhaler Inhale 2 puffs into the lungs every 6 (six) hours as needed for wheezing or shortness of breath.     Ascorbic Acid (VITAMIN C) 1000 MG tablet Take  1,000 mg by mouth daily.     budesonide-formoterol (SYMBICORT) 160-4.5 MCG/ACT inhaler Inhale 2 puffs into the lungs 2 (two) times daily.     cholecalciferol (VITAMIN D3) 25 MCG (1000 UNIT) tablet Take 1,000 Units by mouth daily.     cyclobenzaprine (FLEXERIL) 10 MG tablet Take 0.5-1 tablets (5-10 mg total) by mouth 3 (three) times daily as needed (headache). 30 tablet 2   ferrous sulfate (FERROUSUL) 325 (65 FE) MG tablet Take 1 tablet (325 mg total) by mouth every other day. 60 tablet 1   ondansetron (ZOFRAN ODT) 4 MG disintegrating tablet Take 1 tablet (4 mg total) by mouth every 8 (eight) hours as needed for nausea or vomiting. 20 tablet 0   Prenatal Vit-Fe Fumarate-FA (PRENATAL MULTIVITAMIN) TABS tablet Take 1 tablet by mouth daily at 12 noon.     promethazine (PHENERGAN) 12.5 MG tablet Take 1 tablet (12.5 mg total) by mouth every 6 (six) hours as needed for  nausea or vomiting. 30 tablet 0   scopolamine (TRANSDERM-SCOP, 1.5 MG,) 1 MG/3DAYS Place 1 patch (1.5 mg total) onto the skin every 3 (three) days. 10 patch 12   No current facility-administered medications on file prior to visit.     Review of Systems:  All pertinent positive/negative included in HPI, all other review of systems are negative   Objective:  Physical Exam BP 103/64   Pulse 83   LMP  (LMP Unknown) Comment: reports irreg cycles, ? LMP sometime in Oct, spotted ~11/3 CONSTITUTIONAL: Well-developed, well-nourished female in no acute distress.  EYES: EOM intact ENT: Normocephalic CARDIOVASCULAR: Regular rate  RESPIRATORY: Normal rate.  MUSCULOSKELETAL: Normal ROM SKIN: Warm, dry without erythema  NEUROLOGICAL: Alert, oriented, CN II-XII grossly intact, Appropriate balance PSYCH: Normal behavior, mood   Assessment & Plan:  Assessment: 1. Chronic migraine without aura with status migrainosus, not intractable   2. Pregnancy headache in second trimester    New problems  Plan: Use flexeril nightly for the next week to see if this helps improve her baseline.  Sedation precautions.   Fioricet prn migraine - limit to 2 days per week Phenergan for headache rescue Biofreeze topical may be helpful. May try [facility], acupuncture or chiropractor.   Follow-up PRN  Charlyne Petrin 05/03/2021 8:26 AM

## 2021-05-09 ENCOUNTER — Other Ambulatory Visit: Payer: Self-pay

## 2021-05-09 ENCOUNTER — Ambulatory Visit: Payer: Medicaid Other | Attending: Obstetrics & Gynecology | Admitting: Physical Therapy

## 2021-05-09 ENCOUNTER — Encounter: Payer: Self-pay | Admitting: Physical Therapy

## 2021-05-09 DIAGNOSIS — M5442 Lumbago with sciatica, left side: Secondary | ICD-10-CM | POA: Diagnosis present

## 2021-05-09 DIAGNOSIS — R262 Difficulty in walking, not elsewhere classified: Secondary | ICD-10-CM | POA: Diagnosis present

## 2021-05-09 DIAGNOSIS — M6281 Muscle weakness (generalized): Secondary | ICD-10-CM | POA: Insufficient documentation

## 2021-05-09 DIAGNOSIS — M5441 Lumbago with sciatica, right side: Secondary | ICD-10-CM | POA: Diagnosis present

## 2021-05-09 NOTE — Therapy (Signed)
[facility] [address] [place], [place], [address] Phone: [phone]   Fax:  [phone]  Physical Therapy Evaluation  Patient Details  Name: Carla [name] MRN: [id number] Date of Birth: 1991-07-30 Referring Provider (PT): Willodean Rosenthal, MD   Encounter Date: 05/09/2021   PT End of Session - 05/09/21 1734     Visit Number 1    Date for PT Re-Evaluation 06/20/21    Authorization Type [employer] 6 visits requested    PT Start Time 0848    PT Stop Time 0929    PT Time Calculation (min) 41 min    Activity Tolerance Patient tolerated treatment well;No increased pain    Behavior During Therapy WFL for tasks assessed/performed             Past Medical History:  Diagnosis Date   ADD (attention deficit disorder)    Anemia    Asthma    Bronchitis    Eczema    Hyperthyroidism    Insomnia    Migraine    Recurrent upper respiratory infection (URI)    Urticaria     Past Surgical History:  Procedure Laterality Date   ADENOIDECTOMY     NASAL SEPTOPLASTY W/ TURBINOPLASTY  2017   SINOSCOPY     TONSILLECTOMY      There were no vitals filed for this visit.    Subjective Assessment - 05/09/21 0851     Subjective Presenting due to two month history of back pain with bilateral radiation into bil LE. Patient reports pain became severe approx. 6 weeks ago. Patient reports increased pain with sidelying.    Currently in Pain? Yes    Pain Score 7     Pain Location Back    Pain Orientation Right;Left;Lower;Mid    Pain Descriptors / Indicators Tingling;Burning;Cramping    Pain Type Acute pain    Pain Onset More than a month ago    Pain Frequency Constant    Aggravating Factors  sleeping due to sidelying                [facility] PT Assessment - 05/09/21 0001       Assessment   Medical Diagnosis Z3A.25 (ICD-10-CM) - [redacted] weeks gestation of pregnancy  M54.10 (ICD-10-CM) - Radiculopathy,  unspecified spinal region    Referring Provider (PT) Willodean Rosenthal, MD    Onset Date/Surgical Date --   2 months ago   Hand Dominance Right    Next MD Visit 05/16/2021    Prior Therapy Yes      Precautions   Precaution Comments [redacted] weeks gestation or pregnancy as of today      Restrictions   Weight Bearing Restrictions No      Balance Screen   Has the patient fallen in the past 6 months No      Home Environment   Living Environment Private residence    Living Arrangements Children;Other (Comment)   Significant other   Type of Home Apartment    Home Access Stairs to enter   3rd floor apartment   Home Layout One level      Prior Function   Level of Independence Independent      Cognition   Overall Cognitive Status Within Functional Limits for tasks assessed      Posture/Postural Control   Posture/Postural Control Postural limitations    Postural Limitations Increased lumbar lordosis      ROM / Strength   AROM / PROM / Strength  Strength      Strength   Overall Strength Comments bil LE grossly 4+/5; Rt knee flexion 4/5 with increased pain when testing; Lt hip flex 4/5      Special Tests    Special Tests Lumbar    Lumbar Tests Slump Test      Slump test   Findings Positive    Side Right      Transfers   Transfers Sit to Stand;Stand to Sit    Sit to Stand 7: Independent    Stand to Sit 7: Independent      Balance   Balance Assessed Yes      Standardized Balance Assessment   Standardized Balance Assessment Five Times Sit to Stand    Five times sit to stand comments  14.8 seconds                        Objective measurements completed on examination: See above findings.               PT Education - 05/09/21 1718     Education Details cat/cow, child's pose, standing L stretch at counter, side stepping with thigh resistance, seated hip external rotation stretch    Methods Explanation;Demonstration;Verbal cues;Tactile  cues;Handout    Comprehension Verbalized understanding;Returned demonstration;Verbal cues required;Tactile cues required              PT Short Term Goals - 05/09/21 1730       PT SHORT TERM GOAL #1   Title Patient will be independent with HEP for continued progression at home.    Time 3    Period Weeks    Status New    Target Date 05/30/21      PT SHORT TERM GOAL #2   Title Patient will report no more than 4/10 pain for two consecutive sessions to indicate improved functional activity tolerance.    Baseline 7/10    Time 3    Period Weeks    Status New    Target Date 05/30/21               PT Long Term Goals - 05/09/21 1731       PT LONG TERM GOAL #1   Title Patient will be independent with advanced HEP for long term management of symptoms post D/C.    Time 6    Period Weeks    Status New    Target Date 06/20/21      PT LONG TERM GOAL #2   Title Patient will perform five time sit to stand in 13.5s or less to indicate improved LE endurance for functional mobility.    Baseline 14.8 seconds    Time 6    Period Weeks    Status New    Target Date 06/20/21      PT LONG TERM GOAL #3   Title Patient will report no more than 2/10 pain with daily activities for improved ADLs.    Time 6    Period Weeks    Status New    Target Date 06/20/21                    Plan - 05/09/21 1719     Clinical Impression Statement Patient is a 30 y/o female referred due to lumbar radiculopathy with bilateral LE symptoms however, Rt LE worse than Lt per patient. PMH includes current pregnancy ([redacted] weeks gestation) and orthotastic hypotension. Patient reported activity limitations include stair  negotiation and sleeping as pain increased with sidelying and this is the only position patient can currently assume. She exhibits LE strength impairments as Rt knee flexion 4/5. Slump test positive for Rt side indicating radicular involvement. Patient requiring >14 seconds to complete  five times sit to stand indicating impairment muscular endurance of bil LE with regards to functional mobility. Would benefit from skilled therapeutic intervention to address impairments for decreased pain and improved functional activity tolerance.    Personal Factors and Comorbidities Comorbidity 1    Comorbidities current pregnancy    Examination-Activity Limitations Locomotion Level;Transfers;Stairs    Examination-Participation Restrictions Community Activity;Other;Cleaning   child care   Stability/Clinical Decision Making Evolving/Moderate complexity    Clinical Decision Making Moderate    Rehab Potential Good    PT Frequency 1x / week    PT Duration 6 weeks    PT Treatment/Interventions ADLs/Self Care Home Management;Moist Heat;Gait training;Stair training;Functional mobility training;Therapeutic activities;Therapeutic exercise;Patient/family education;Manual techniques;Taping    PT Next Visit Plan review HEP; begin core strengthening and lumbar/hip mobility    PT Home Exercise Plan Access Code: [email or web address]    Consulted and Agree with Plan of Care Patient             Patient will benefit from skilled therapeutic intervention in order to improve the following deficits and impairments:  Decreased activity tolerance, Decreased endurance, Decreased strength, Difficulty walking, Pain  Visit Diagnosis: Muscle weakness (generalized) - Plan: PT plan of care cert/re-cert  Acute bilateral low back pain with bilateral sciatica - Plan: PT plan of care cert/re-cert  Difficulty in walking, not elsewhere classified - Plan: PT plan of care cert/re-cert     Problem List Patient Active Problem List   Diagnosis Date Noted   Pregnancy related leg pain, antepartum, third trimester 04/11/2021   Lightheadedness 03/11/2021   Atypical chest pain 03/11/2021   Orthostatic hypotension 03/11/2021   Urticaria    Recurrent upper respiratory infection (URI)    Migraine    Insomnia    Hyperthyroidism     Eczema    Bronchitis    Asthma    ADD (attention deficit disorder)    History of depression 01/22/2021   Hyperthyroidism affecting pregnancy, antepartum 12/31/2020   Anemia, antepartum 12/31/2020   Abnormal Pap smear of cervix 12/31/2020   Supervision of other normal pregnancy, antepartum 12/24/2020   Cervical radiculopathy 12/13/2020   Slow transit constipation 12/13/2020   Old vaginal laceration 11/23/2019     Anabel Halon PT, DPT  05/09/21 5:37 PM   [facility] [address], [address] [place], [place], [address] Phone: [phone]   Fax:  [phone]  Name: Ellason Segar MRN: [id number] Date of Birth: 08/12/1991

## 2021-05-09 NOTE — Patient Instructions (Signed)
Access Code: [email or web address] URL: [email or web address] Date: 05/09/2021 Prepared by: Anabel Halon  Exercises Cat Cow - 2 x daily - 7 x weekly - 1 sets - 10 reps Child's Pose Stretch - 2 x daily - 7 x weekly - 1 sets - 5 reps - 10s hold Standing 'L' Stretch at Counter - 2 x daily - 7 x weekly - 1 sets - 5 reps - 10s hold Side Stepping with Resistance at Thighs - 2 x daily - 7 x weekly - 1 sets - 5 reps Seated Hip External Rotation Stretch - 2 x daily - 7 x weekly - 1 sets - 2 reps - 20s hold

## 2021-05-13 ENCOUNTER — Ambulatory Visit: Payer: Medicaid Other | Admitting: Physical Therapy

## 2021-05-16 ENCOUNTER — Ambulatory Visit (INDEPENDENT_AMBULATORY_CARE_PROVIDER_SITE_OTHER): Payer: Medicaid Other | Admitting: Family Medicine

## 2021-05-16 ENCOUNTER — Other Ambulatory Visit: Payer: Self-pay

## 2021-05-16 VITALS — BP 110/66 | HR 97 | Wt 165.8 lb

## 2021-05-16 DIAGNOSIS — M5410 Radiculopathy, site unspecified: Secondary | ICD-10-CM

## 2021-05-16 DIAGNOSIS — Z23 Encounter for immunization: Secondary | ICD-10-CM | POA: Diagnosis not present

## 2021-05-16 DIAGNOSIS — O99019 Anemia complicating pregnancy, unspecified trimester: Secondary | ICD-10-CM

## 2021-05-16 DIAGNOSIS — Z3A29 29 weeks gestation of pregnancy: Secondary | ICD-10-CM

## 2021-05-16 DIAGNOSIS — Z3480 Encounter for supervision of other normal pregnancy, unspecified trimester: Secondary | ICD-10-CM

## 2021-05-16 DIAGNOSIS — O99280 Endocrine, nutritional and metabolic diseases complicating pregnancy, unspecified trimester: Secondary | ICD-10-CM

## 2021-05-16 DIAGNOSIS — E0590 Thyrotoxicosis, unspecified without thyrotoxic crisis or storm: Secondary | ICD-10-CM

## 2021-05-16 DIAGNOSIS — N898 Other specified noninflammatory disorders of vagina: Secondary | ICD-10-CM

## 2021-05-16 MED ORDER — NIFEDIPINE ER OSMOTIC RELEASE 30 MG PO TB24: 30.0000 mg | ORAL_TABLET | Freq: Every day | ORAL | 2 refills | Status: DC

## 2021-05-16 NOTE — Progress Notes (Addendum)
PRENATAL VISIT NOTE  Subjective:  Carla [name] is a 30 y.o. [phone] at [redacted]w[redacted]d being seen today for ongoing prenatal care.  She is currently monitored for the following issues for this high-risk pregnancy and has Cervical radiculopathy; Slow transit constipation; Supervision of other normal pregnancy, antepartum; Hyperthyroidism affecting pregnancy, antepartum; Anemia, antepartum; Abnormal Pap smear of cervix; History of depression; Urticaria; Recurrent upper respiratory infection (URI); Migraine; Insomnia; Hyperthyroidism; Eczema; Bronchitis; Asthma; ADD (attention deficit disorder); Old vaginal laceration; Lightheadedness; Atypical chest pain; Orthostatic hypotension; and Pregnancy related leg pain, antepartum, third trimester on their problem list.  Patient reports  frequent contractions .  Contractions: Irregular. Vag. Bleeding: None.  Movement: Present. Denies leaking of fluid.   The following portions of the patient's history were reviewed and updated as appropriate: allergies, current medications, past family history, past medical history, past social history, past surgical history and problem list.   Objective:   Vitals:   05/16/21 0813  BP: 110/66  Pulse: 97  Weight: 165 lb 12 oz (75.2 kg)    Fetal Status: Fetal Heart Rate (bpm): 141 Fundal Height: 30 cm Movement: Present     General:  Alert, oriented and cooperative. Patient is in no acute distress.  Skin: Skin is warm and dry. No rash noted.   Cardiovascular: Normal heart rate noted  Respiratory: Normal respiratory effort, no problems with respiration noted  Abdomen: Soft, gravid, appropriate for gestational age.  Pain/Pressure: Present     Pelvic: Cervical exam deferred        Extremities: Normal range of motion.  Edema: Trace  Mental Status: Normal mood and affect. Normal behavior. Normal judgment and thought content.   Assessment and Plan:  Pregnancy: [id number] at [redacted]w[redacted]d 1. [redacted] weeks gestation of pregnancy - Tdap  vaccine greater than or equal to 7yo IM  2. Supervision of other normal pregnancy, antepartum FHT and FH normal. H/o 4th degree laceration with first pregnancy. Had episiotomy with second. Serial [place]. Plans vaginal delivery if weights remain less than first baby.  Procardia for preterm contractions.  3. Hyperthyroidism affecting pregnancy, antepartum Sees endocrine  4. Radiculopathy, unspecified spinal region Continues with symptoms. Has appt with Neurology  5. Anemia, antepartum On iron  Preterm labor symptoms and general obstetric precautions including but not limited to vaginal bleeding, contractions, leaking of fluid and fetal movement were reviewed in detail with the patient. Please refer to After Visit Summary for other counseling recommendations.   No follow-ups on file.  Future Appointments  Date Time Provider Department Center  05/20/2021  7:15 AM [facility] NURSE [facility]  05/20/2021  7:30 AM [facility] US2 [facility]  05/22/2021 11:45 AM Dozier, Delrae Rend, PT [facility]  05/24/2021 10:10 AM Shamleffer, Konrad Dolores, MD [facility] None  05/29/2021  9:30 AM Barbaraann Faster, PT [facility]  05/29/2021 10:45 AM Levie Heritage, DO [facility] None  05/31/2021  1:20 PM Tobb, Lavona Mound, DO [facility] None  06/05/2021  9:30 AM Barbaraann Faster, PT [facility]  06/12/2021  9:45 AM Anyanwu, Jethro Bastos, MD [facility] None  06/12/2021 11:00 AM Barbaraann Faster, PT [facility]  06/19/2021  9:30 AM Barbaraann Faster, PT [facility]  06/26/2021  3:30 PM Willodean Rosenthal, MD [facility] None  07/04/2021  8:15 AM Levie Heritage, DO [facility] None  07/09/2021  3:30 PM Levert Feinstein, MD [facility] None  07/11/2021  8:15 AM Levie Heritage, DO [facility] None  07/18/2021  8:15 AM Levie Heritage, DO [facility] None  07/24/2021  8:15 AM Levie Heritage, DO [facility] None    Levie Heritage, DO

## 2021-05-20 ENCOUNTER — Ambulatory Visit (HOSPITAL_BASED_OUTPATIENT_CLINIC_OR_DEPARTMENT_OTHER): Payer: Medicaid Other

## 2021-05-20 ENCOUNTER — Other Ambulatory Visit: Payer: Self-pay

## 2021-05-20 ENCOUNTER — Encounter: Payer: Self-pay | Admitting: *Deleted

## 2021-05-20 ENCOUNTER — Ambulatory Visit: Payer: Medicaid Other | Attending: Obstetrics and Gynecology | Admitting: *Deleted

## 2021-05-20 ENCOUNTER — Other Ambulatory Visit: Payer: Self-pay | Admitting: *Deleted

## 2021-05-20 VITALS — BP 114/64 | HR 80

## 2021-05-20 DIAGNOSIS — O99013 Anemia complicating pregnancy, third trimester: Secondary | ICD-10-CM

## 2021-05-20 DIAGNOSIS — Z3A30 30 weeks gestation of pregnancy: Secondary | ICD-10-CM | POA: Diagnosis not present

## 2021-05-20 DIAGNOSIS — O99283 Endocrine, nutritional and metabolic diseases complicating pregnancy, third trimester: Secondary | ICD-10-CM | POA: Diagnosis not present

## 2021-05-20 DIAGNOSIS — O99019 Anemia complicating pregnancy, unspecified trimester: Secondary | ICD-10-CM

## 2021-05-20 DIAGNOSIS — O99513 Diseases of the respiratory system complicating pregnancy, third trimester: Secondary | ICD-10-CM

## 2021-05-20 DIAGNOSIS — E0590 Thyrotoxicosis, unspecified without thyrotoxic crisis or storm: Secondary | ICD-10-CM

## 2021-05-20 DIAGNOSIS — O09293 Supervision of pregnancy with other poor reproductive or obstetric history, third trimester: Secondary | ICD-10-CM

## 2021-05-20 DIAGNOSIS — Z362 Encounter for other antenatal screening follow-up: Secondary | ICD-10-CM | POA: Diagnosis not present

## 2021-05-20 DIAGNOSIS — D649 Anemia, unspecified: Secondary | ICD-10-CM

## 2021-05-20 DIAGNOSIS — J45909 Unspecified asthma, uncomplicated: Secondary | ICD-10-CM

## 2021-05-20 DIAGNOSIS — Z3480 Encounter for supervision of other normal pregnancy, unspecified trimester: Secondary | ICD-10-CM

## 2021-05-22 ENCOUNTER — Ambulatory Visit: Payer: Medicaid Other

## 2021-05-22 ENCOUNTER — Ambulatory Visit: Payer: Medicaid Other | Admitting: Physical Therapy

## 2021-05-24 ENCOUNTER — Telehealth: Payer: Self-pay

## 2021-05-24 ENCOUNTER — Encounter: Payer: Self-pay | Admitting: Internal Medicine

## 2021-05-24 ENCOUNTER — Other Ambulatory Visit: Payer: Self-pay

## 2021-05-24 ENCOUNTER — Ambulatory Visit: Payer: Medicaid Other | Admitting: Internal Medicine

## 2021-05-24 VITALS — BP 118/68 | HR 78 | Ht 69.0 in | Wt 168.0 lb

## 2021-05-24 DIAGNOSIS — E0590 Thyrotoxicosis, unspecified without thyrotoxic crisis or storm: Secondary | ICD-10-CM | POA: Diagnosis not present

## 2021-05-24 LAB — TSH: TSH: 0.54 u[IU]/mL (ref 0.35–5.50)

## 2021-05-24 NOTE — Patient Instructions (Addendum)
-   PLease stop by the labs today

## 2021-05-24 NOTE — Progress Notes (Signed)
Name: Carla [name] Carla [name]  MRN/ DOB: [id number], 1991-08-05    Age/ Sex: 30 y.o., female     PCP: Pcp, No   Reason for Endocrinology Evaluation: Hyperthyroidism     Initial Endocrinology Clinic Visit: 03/29/2019    PATIENT IDENTIFIER: [name] Carla [name] is a 30 y.o., female with a past medical history of asthma, migraine headaches and hyperthyroidism during pregnancy. She has followed with [facility] since 03/28/2021 for consultative assistance with management of her hyperthyroidism.   HISTORICAL SUMMARY:   Pt was diagnosed with hyperthyroidism in 11/2020 with a suppressed TSH at  0.012 uIU/Ml , shortly after testing positive for pregnancy, these results were confirmed  In 12/2020 with elevated FT3 at 4.7 pg/mL but with normal FT4 at 1.66 ng/dL       Per pt she has been diagnosed with hyperthyroidism for ~ 7 yrs. No prior treatment  On her initial visit to our clinic she was [redacted] weeks pregnant with her third pregnancy.  TFTs were normal and no treatment was offered at the time.  She has a daughter born in 2011 and a son born in 2012 Has paternal and maternal grandmother history of thyroid disease   SUBJECTIVE:     Today (05/24/2021):  [name] Carla [name] is here for Hyperthyroidism.   She is currently 31.1 weeks of gestation  Currently under close supervision for decreased amniotic fluid Weight stable  Has nausea and vomiting  Denies fever  Has occasional abdminal pain  Denies diarrhea  Has occasional tremors  No palpitations    EDD 9/1st - girl    HISTORY:  Past Medical History:  Past Medical History:  Diagnosis Date   ADD (attention deficit disorder)    Anemia    Asthma    Bronchitis    Eczema    Hyperthyroidism    Insomnia    Migraine    Recurrent upper respiratory infection (URI)    Urticaria    Past Surgical History:  Past Surgical History:  Procedure Laterality Date   ADENOIDECTOMY     NASAL SEPTOPLASTY W/ TURBINOPLASTY  2017    SINOSCOPY     TONSILLECTOMY     Social History:  reports that she quit smoking about 2 years ago. Her smoking use included cigarettes. She has a 1.00 pack-year smoking history. She has never used smokeless tobacco. She reports that she does not drink alcohol and does not use drugs. Family History:  Family History  Problem Relation Age of Onset   Hypertension Mother    Hypertension Father    Diabetes Father    Diabetes Brother    Diabetes Maternal Grandmother    Stroke Maternal Grandmother    Diabetes Paternal Grandfather    Heart disease Neg Hx      HOME MEDICATIONS: Allergies as of 05/24/2021       Reactions   Latex Hives        Medication List        Accurate as of May 24, 2021  7:53 AM. If you have any questions, ask your nurse or doctor.          albuterol 108 (90 Base) MCG/ACT inhaler Commonly known as: VENTOLIN HFA Inhale 2 puffs into the lungs every 6 (six) hours as needed for wheezing or shortness of breath.   budesonide-formoterol 160-4.5 MCG/ACT inhaler Commonly known as: SYMBICORT Inhale 2 puffs into the lungs 2 (two) times daily.   Butalbital-APAP-Caffeine 50-325-40 MG capsule Take 1-2 capsules by mouth every 6 (  six) hours as needed for headache.   cholecalciferol 25 MCG (1000 UNIT) tablet Commonly known as: VITAMIN D3 Take 1,000 Units by mouth daily.   cyclobenzaprine 10 MG tablet Commonly known as: FLEXERIL Take 1 tablet (10 mg total) by mouth 3 (three) times daily as needed for muscle spasms.   ferrous sulfate 325 (65 FE) MG tablet Commonly known as: FerrouSul Take 1 tablet (325 mg total) by mouth every other day.   NIFEdipine 30 MG 24 hr tablet Commonly known as: PROCARDIA-XL/NIFEDICAL-XL Take 1 tablet (30 mg total) by mouth daily. Can increase to twice a day as needed for symptomatic contractions   ondansetron 4 MG disintegrating tablet Commonly known as: Zofran ODT Take 1 tablet (4 mg total) by mouth every 8 (eight) hours as needed  for nausea or vomiting.   prenatal multivitamin Tabs tablet Take 1 tablet by mouth daily at 12 noon.   promethazine 25 MG tablet Commonly known as: PHENERGAN Take 1 tablet (25 mg total) by mouth every 6 (six) hours as needed for nausea or vomiting.   scopolamine 1 MG/3DAYS Commonly known as: Transderm-Scop (1.5 MG) Place 1 patch (1.5 mg total) onto the skin every 3 (three) days.   vitamin C 1000 MG tablet Take 1,000 mg by mouth daily.          OBJECTIVE:   PHYSICAL EXAM: VS: LMP  (LMP Unknown) Comment: reports irreg cycles, ? LMP sometime in Oct, spotted ~11/3   EXAM: General: Pt appears well and is in NAD  Neck: General: Supple without adenopathy. Thyroid: Thyroid size normal.  No goiter or nodules appreciated.   Lungs: Clear with good BS bilat with no rales, rhonchi, or wheezes  Heart: Auscultation: RRR.  Abdomen: Gravid uterus , soft, nontender   Extremities: BL LE: No pretibial edema normal ROM and strength.  Mental Status: Judgment, insight: Intact Orientation: Oriented to time, place, and person Memory: Intact for recent and remote events Mood and affect: No depression, anxiety, or agitation     DATA REVIEWED:  Results for Carla [name] Carla [name], Carla [name] [facility]la [name] (MRN [id number]) as of 05/24/2021 14:10  Ref. Range 05/24/2021 10:44  TSH Latest Ref Range: 0.35 - 5.50 uIU/mL 0.54   Results for Carla [name] Carla [name], Carla [name] (MRN [id number]) as of 05/27/2021 15:00  Ref. Range 05/24/2021 10:44  Triiodothyronine (T3) Latest Ref Range: 76 - 181 ng/dL 564 (H)  Thyroxine (T4) Latest Ref Range: 5.1 - 11.9 mcg/dL 33.2 (H)  THYROID STIMULATING IMMUNOGLOBULIN Unknown Rpt    TSI pending   ASSESSMENT / PLAN / RECOMMENDATIONS:   Hyperthyroidism during pregnancy   - Pt is clinically euthyroid - No local neck symptoms  - TSh normal but T4 and T3 are elevated but trending down  -The goal of treatment is to maintain persistent but mild hyperthyroidism in the mother in an attempt to prevent fetal  hypothyroidism since the fetal thyroid is more sensitive to the action of thionamide therapy. Overtreatment of maternal hyperthyroidism can cause fetal goiter and primary hypothyroidism.  - TSI pending    Medications   N/A   F/U in 3 months  Labs in 4 weeks     Signed electronically by: Lyndle Herrlich, MD  [facility] Endocrinology  [facility] [address] [place], [place] [address] Phone: [phone] FAX: [phone]      CC: Pcp, No No address on file Phone: None  Fax: None   Return to Endocrinology clinic as below: Future Appointments  Date Time Provider Department Center  05/24/2021  10:10 AM Asyah Candler, Konrad Dolores, MD [facility] None  05/29/2021 10:45 AM Levie Heritage, DO [facility] None  05/31/2021  1:20 PM Tobb, Kardie, DO [facility] None  06/03/2021 11:00 AM [facility] NURSE [facility]  06/03/2021 11:15 AM [facility] US2 [facility]  06/05/2021  9:30 AM Barbaraann Faster, PT [facility]  06/12/2021  9:45 AM Anyanwu, Jethro Bastos, MD [facility] None  06/12/2021 11:00 AM Barbaraann Faster, PT [facility]  06/17/2021  8:00 AM [facility] NURSE [facility]  06/17/2021  8:15 AM [facility] US2 [facility]  06/19/2021  9:30 AM Barbaraann Faster, PT [facility]  06/26/2021  3:30 PM Willodean Rosenthal, MD [facility] None  07/04/2021  8:15 AM Levie Heritage, DO [facility] None  07/09/2021  3:30 PM Levert Feinstein, MD [facility] None  07/11/2021  8:15 AM Levie Heritage, DO [facility] None  07/18/2021  8:15 AM Levie Heritage, DO [facility] None  07/24/2021  8:15 AM Adrian Blackwater Rhona Raider, DO [facility] None

## 2021-05-24 NOTE — Telephone Encounter (Signed)
Pt called wanting to know should she stop going to therapy because she has contractions every time she goes. She also states she is having back pain. Pt made aware that she can cancel the appt and talk to the provider about her back pain. Understanding was voiced.  Wasim Hurlbut l Khair Chasteen, CMA

## 2021-05-29 ENCOUNTER — Encounter: Payer: Medicaid Other | Admitting: Physical Therapy

## 2021-05-29 ENCOUNTER — Other Ambulatory Visit: Payer: Self-pay

## 2021-05-29 ENCOUNTER — Ambulatory Visit (INDEPENDENT_AMBULATORY_CARE_PROVIDER_SITE_OTHER): Payer: Medicaid Other | Admitting: Family Medicine

## 2021-05-29 VITALS — BP 122/72 | HR 78 | Wt 165.0 lb

## 2021-05-29 DIAGNOSIS — O99280 Endocrine, nutritional and metabolic diseases complicating pregnancy, unspecified trimester: Secondary | ICD-10-CM | POA: Diagnosis not present

## 2021-05-29 DIAGNOSIS — E0590 Thyrotoxicosis, unspecified without thyrotoxic crisis or storm: Secondary | ICD-10-CM

## 2021-05-29 DIAGNOSIS — Z3480 Encounter for supervision of other normal pregnancy, unspecified trimester: Secondary | ICD-10-CM | POA: Diagnosis not present

## 2021-05-29 DIAGNOSIS — Z3A31 31 weeks gestation of pregnancy: Secondary | ICD-10-CM | POA: Diagnosis not present

## 2021-05-29 DIAGNOSIS — M5431 Sciatica, right side: Secondary | ICD-10-CM | POA: Diagnosis not present

## 2021-05-29 DIAGNOSIS — M9904 Segmental and somatic dysfunction of sacral region: Secondary | ICD-10-CM

## 2021-05-29 DIAGNOSIS — M5432 Sciatica, left side: Secondary | ICD-10-CM

## 2021-05-29 DIAGNOSIS — M9903 Segmental and somatic dysfunction of lumbar region: Secondary | ICD-10-CM

## 2021-05-29 DIAGNOSIS — M9905 Segmental and somatic dysfunction of pelvic region: Secondary | ICD-10-CM

## 2021-05-29 LAB — THYROID STIMULATING IMMUNOGLOBULIN: TSI: <89 % baseline (ref <140)

## 2021-05-29 LAB — T3: T3, Total: 190 ng/dL — ABNORMAL HIGH (ref 76–181)

## 2021-05-29 LAB — T4: T4, Total: 13.1 ug/dL — ABNORMAL HIGH (ref 5.1–11.9)

## 2021-05-29 NOTE — Progress Notes (Signed)
   PRENATAL VISIT NOTE  Subjective:  Carla [name] is a 30 y.o. [phone] at [redacted]w[redacted]d being seen today for ongoing prenatal care.  She is currently monitored for the following issues for this high-risk pregnancy and has Cervical radiculopathy; Slow transit constipation; Supervision of other normal pregnancy, antepartum; Hyperthyroidism affecting pregnancy, antepartum; Anemia, antepartum; Abnormal Pap smear of cervix; History of depression; Urticaria; Recurrent upper respiratory infection (URI); Migraine; Insomnia; Hyperthyroidism; Eczema; Bronchitis; Asthma; ADD (attention deficit disorder); Old vaginal laceration; Lightheadedness; Atypical chest pain; Orthostatic hypotension; and Pregnancy related leg pain, antepartum, third trimester on their problem list.  Patient reports  continues to have some back pain in the lumbar region - radiation down legs and up back. Worse with movements. Some decreased fetal movement. .  Contractions: Irregular. Vag. Bleeding: None.  Movement: (!) Decreased. Denies leaking of fluid.   The following portions of the patient's history were reviewed and updated as appropriate: allergies, current medications, past family history, past medical history, past social history, past surgical history and problem list. Problem list updated.  Objective:   Vitals:   05/29/21 1036  BP: 122/72  Pulse: 78  Weight: 165 lb (74.8 kg)    Fetal Status:     Movement: (!) Decreased     General:  Alert, oriented and cooperative. Patient is in no acute distress.  Skin: Skin is warm and dry. No rash noted.   Cardiovascular: Normal heart rate noted  Respiratory: Normal respiratory effort, no problems with respiration noted  Abdomen: Soft, gravid, appropriate for gestational age. Pain/Pressure: Present     Pelvic:  Cervical exam deferred        MSK: Restriction, tenderness, tissue texture changes, and paraspinal spasm in the lumbar spine  Neuro: Moves all four extremities with no  focal neurological deficit  Extremities: Normal range of motion.  Edema: None  Mental Status: Normal mood and affect. Normal behavior. Normal judgment and thought content.   OSE: Head   Cervical   Thoracic   Rib   Lumbar L5 ESRR  Sacrum L/L  Pelvis Right ant innom    Assessment and Plan:  Pregnancy: [id number] at [redacted]w[redacted]d  1. [redacted] weeks gestation of pregnancy  2. Supervision of other normal pregnancy, antepartum FHT and fh normal  3. Hyperthyroidism affecting pregnancy, antepartum Sees endocrinology.  4. Bilateral sciatica 5. Somatic dysfunction of pelvis region 6. Somatic dysfunction of sacral region 7. Somatic dysfunction of lumbar region OMT done after patient permission. HVLA technique utilized. 3 areas treated with improvement of tissue texture and joint mobility. Patient tolerated procedure well.    Preterm labor symptoms and general obstetric precautions including but not limited to vaginal bleeding, contractions, leaking of fluid and fetal movement were reviewed in detail with the patient. Please refer to After Visit Summary for other counseling recommendations.  No follow-ups on file.  Levie Heritage, DO

## 2021-05-30 DIAGNOSIS — D649 Anemia, unspecified: Secondary | ICD-10-CM | POA: Insufficient documentation

## 2021-05-31 ENCOUNTER — Ambulatory Visit: Payer: Medicaid Other | Admitting: Cardiology

## 2021-05-31 NOTE — Telephone Encounter (Signed)
Patient added to schedule.

## 2021-06-02 ENCOUNTER — Inpatient Hospital Stay (HOSPITAL_COMMUNITY)
Admission: AD | Admit: 2021-06-02 | Discharge: 2021-06-02 | Disposition: A | Discharge status: Home / Self Care | Payer: Medicaid Other | Attending: Obstetrics & Gynecology | Admitting: Obstetrics & Gynecology

## 2021-06-02 ENCOUNTER — Encounter (HOSPITAL_COMMUNITY): Payer: Self-pay | Admitting: Obstetrics & Gynecology

## 2021-06-02 ENCOUNTER — Other Ambulatory Visit: Payer: Self-pay

## 2021-06-02 DIAGNOSIS — O99891 Other specified diseases and conditions complicating pregnancy: Secondary | ICD-10-CM | POA: Diagnosis not present

## 2021-06-02 DIAGNOSIS — O26893 Other specified pregnancy related conditions, third trimester: Secondary | ICD-10-CM

## 2021-06-02 DIAGNOSIS — Z87891 Personal history of nicotine dependence: Secondary | ICD-10-CM | POA: Diagnosis not present

## 2021-06-02 DIAGNOSIS — N949 Unspecified condition associated with female genital organs and menstrual cycle: Secondary | ICD-10-CM | POA: Diagnosis not present

## 2021-06-02 DIAGNOSIS — Z3A32 32 weeks gestation of pregnancy: Secondary | ICD-10-CM | POA: Insufficient documentation

## 2021-06-02 DIAGNOSIS — R102 Pelvic and perineal pain: Secondary | ICD-10-CM | POA: Insufficient documentation

## 2021-06-02 DIAGNOSIS — Z91040 Latex allergy status: Secondary | ICD-10-CM | POA: Diagnosis not present

## 2021-06-02 DIAGNOSIS — Z79899 Other long term (current) drug therapy: Secondary | ICD-10-CM | POA: Insufficient documentation

## 2021-06-02 DIAGNOSIS — M5431 Sciatica, right side: Secondary | ICD-10-CM

## 2021-06-02 HISTORY — DX: Palpitations: R00.2

## 2021-06-02 NOTE — MAU Note (Addendum)
Pt reports 1 hour ago she began having increasing pelvic pressure and states she can not close her legs and is having difficulty walking. Denies SROM, vaginal bleeding or bloody show.  States movement has been decreased over the last 3 days and pt reports that her MD performed NST on Tuesday. Is scheduled for ultrasound on Monday.

## 2021-06-02 NOTE — MAU Provider Note (Signed)
Patient Carla [name] is a 30 y.o. [phone]  At [redacted]w[redacted]d here with complaints of pelvic pain and still with burning/tingling in upper hips that has not gone away since she was seen in MAU on May 18. She denies vaginal discharge, vaginal bleeding, LOF, contractions. She has no concerns about her baby. She notices no difference in fetal movements; she had reported that her fetal movements were decreased last week and so Dr. Adrian Blackwater put her on the monitor and she was "fine".   She is seeing PT for this problem; she goes to [facility] in [place]. Her next appt there is this week.   Her pregnancy is complicated by  Patient Active Problem List   Diagnosis Date Noted   Anemia 05/30/2021   Pregnancy related leg pain, antepartum, third trimester 04/11/2021   Lightheadedness 03/11/2021   Atypical chest pain 03/11/2021   Orthostatic hypotension 03/11/2021   Urticaria    Recurrent upper respiratory infection (URI)    Migraine    Insomnia    Hyperthyroidism    Eczema    Bronchitis    Asthma    ADD (attention deficit disorder)    History of depression 01/22/2021   Hyperthyroidism affecting pregnancy, antepartum 12/31/2020   Anemia, antepartum 12/31/2020   Abnormal Pap smear of cervix 12/31/2020   Supervision of other normal pregnancy, antepartum 12/24/2020   Cervical radiculopathy 12/13/2020   Slow transit constipation 12/13/2020   Old vaginal laceration 11/23/2019    History     CSN: [id number]  Arrival date and time: 06/02/21 0120   Event Date/Time   First Provider Initiated Contact with Patient 06/02/21 [phone]      Chief Complaint  Patient presents with   Contractions   Pelvic Pain   Pelvic Pain The patient's primary symptoms include pelvic pain. The patient's pertinent negatives include no genital itching or vaginal bleeding. She is pregnant. Pertinent negatives include no abdominal pain, constipation, diarrhea, dysuria, fever, urgency or vomiting. The symptoms are  aggravated by activity. She has tried acetaminophen for the symptoms. Her past medical history is significant for a gynecological surgery.   OB History     Gravida  5   Para  2   Term  2   Preterm      AB  2   Living  2      SAB  1   IAB  1   Ectopic      Multiple      Live Births  2           Past Medical History:  Diagnosis Date   ADD (attention deficit disorder)    Anemia    Asthma    Bronchitis    Eczema    Heart palpitations    Hyperthyroidism    Insomnia    Migraine    Recurrent upper respiratory infection (URI)    Urticaria     Past Surgical History:  Procedure Laterality Date   ADENOIDECTOMY     NASAL SEPTOPLASTY W/ TURBINOPLASTY  2017   SINOSCOPY     TONSILLECTOMY     VAGINA RECONSTRUCTION SURGERY N/A     Family History  Problem Relation Age of Onset   Hypertension Mother    Hypertension Father    Diabetes Father    Diabetes Brother    Diabetes Maternal Grandmother    Stroke Maternal Grandmother    Diabetes Paternal Grandfather    Heart disease Neg Hx     Social History   Tobacco  Use   Smoking status: Former    Packs/day: 0.25    Years: 4.00    Pack years: 1.00    Types: Cigarettes    Quit date: 08/18/2018    Years since quitting: 2.7   Smokeless tobacco: Never   Tobacco comments:    patient quit 2 weeks ago. 09-01-18  Vaping Use   Vaping Use: Never used  Substance Use Topics   Alcohol use: Never   Drug use: Never    Allergies:  Allergies  Allergen Reactions   Latex Hives    Medications Prior to Admission  Medication Sig Dispense Refill Last Dose   Ascorbic Acid (VITAMIN C) 1000 MG tablet Take 1,000 mg by mouth daily.   06/01/2021   Butalbital-APAP-Caffeine 50-325-40 MG capsule Take 1-2 capsules by mouth every 6 (six) hours as needed for headache. 30 capsule 3 06/01/2021 at 1900   cholecalciferol (VITAMIN D3) 25 MCG (1000 UNIT) tablet Take 1,000 Units by mouth daily.   06/01/2021   cyclobenzaprine (FLEXERIL) 10 MG  tablet Take 1 tablet (10 mg total) by mouth 3 (three) times daily as needed for muscle spasms. 40 tablet 2 06/01/2021 at 2100   ferrous sulfate (FERROUSUL) 325 (65 FE) MG tablet Take 1 tablet (325 mg total) by mouth every other day. 60 tablet 1 Past Week   Prenatal Vit-Fe Fumarate-FA (PRENATAL MULTIVITAMIN) TABS tablet Take 1 tablet by mouth daily at 12 noon.   06/01/2021   albuterol (PROVENTIL HFA;VENTOLIN HFA) 108 (90 Base) MCG/ACT inhaler Inhale 2 puffs into the lungs every 6 (six) hours as needed for wheezing or shortness of breath.   More than a month   budesonide-formoterol (SYMBICORT) 160-4.5 MCG/ACT inhaler Inhale 2 puffs into the lungs 2 (two) times daily.   More than a month   NIFEdipine (PROCARDIA-XL/NIFEDICAL-XL) 30 MG 24 hr tablet Take 1 tablet (30 mg total) by mouth daily. Can increase to twice a day as needed for symptomatic contractions (Patient not taking: Reported on 05/29/2021) 60 tablet 2    ondansetron (ZOFRAN ODT) 4 MG disintegrating tablet Take 1 tablet (4 mg total) by mouth every 8 (eight) hours as needed for nausea or vomiting. (Patient not taking: Reported on 05/29/2021) 20 tablet 0    promethazine (PHENERGAN) 25 MG tablet Take 1 tablet (25 mg total) by mouth every 6 (six) hours as needed for nausea or vomiting. (Patient not taking: Reported on 05/29/2021) 30 tablet 0    scopolamine (TRANSDERM-SCOP, 1.5 MG,) 1 MG/3DAYS Place 1 patch (1.5 mg total) onto the skin every 3 (three) days. (Patient not taking: Reported on 05/29/2021) 10 patch 12     Review of Systems  Constitutional: Negative.  Negative for fever.  HENT: Negative.    Respiratory: Negative.    Gastrointestinal: Negative.  Negative for abdominal pain, constipation, diarrhea and vomiting.  Genitourinary:  Positive for pelvic pain. Negative for dysuria and urgency.  Neurological: Negative.   Psychiatric/Behavioral: Negative.    Physical Exam   Blood pressure 123/72, pulse 83, temperature 98.5 F (36.9 C), temperature source  Oral, resp. rate 17, height 5\' 9"  (1.753 m), weight 74.8 kg, SpO2 100 %, unknown if currently breastfeeding.  Physical Exam Constitutional:      Appearance: Normal appearance.  Musculoskeletal:        General: Normal range of motion.  Skin:    General: Skin is warm and dry.  Neurological:     Mental Status: She is alert.  Psychiatric:        Mood and  Affect: Mood normal.  Cervix checked; cervix is long, posterior, closed  MAU Course  Procedures  MDM  -NST: 130 bpm, mod var, present acel, one decel, no contractions -she declines wet prep, GC CT   Assessment and Plan   1. Round ligament pain    -suspect patient could have partially separated pubis symphsis -patient to discuss with her PT the exacerbation in symptoms; recommend a hip binder, perhaps change in therapy to address new concerns -patient declines RX for pain; has flexeril at home -patient will have c/section or IOL at 37 weeks (per patient) due to 4th degree  Charlesetta Garibaldi Anett Ranker 06/02/2021, 3:35 AM

## 2021-06-03 ENCOUNTER — Ambulatory Visit: Payer: Medicaid Other | Admitting: *Deleted

## 2021-06-03 ENCOUNTER — Ambulatory Visit: Payer: Medicaid Other | Attending: Obstetrics and Gynecology

## 2021-06-03 ENCOUNTER — Encounter: Payer: Self-pay | Admitting: *Deleted

## 2021-06-03 VITALS — BP 124/65 | HR 80

## 2021-06-03 DIAGNOSIS — Z3480 Encounter for supervision of other normal pregnancy, unspecified trimester: Secondary | ICD-10-CM | POA: Insufficient documentation

## 2021-06-03 DIAGNOSIS — D649 Anemia, unspecified: Secondary | ICD-10-CM

## 2021-06-03 DIAGNOSIS — O99280 Endocrine, nutritional and metabolic diseases complicating pregnancy, unspecified trimester: Secondary | ICD-10-CM | POA: Insufficient documentation

## 2021-06-03 DIAGNOSIS — Z3A32 32 weeks gestation of pregnancy: Secondary | ICD-10-CM

## 2021-06-03 DIAGNOSIS — O99019 Anemia complicating pregnancy, unspecified trimester: Secondary | ICD-10-CM | POA: Insufficient documentation

## 2021-06-03 DIAGNOSIS — E0590 Thyrotoxicosis, unspecified without thyrotoxic crisis or storm: Secondary | ICD-10-CM | POA: Diagnosis not present

## 2021-06-03 DIAGNOSIS — O99013 Anemia complicating pregnancy, third trimester: Secondary | ICD-10-CM

## 2021-06-03 DIAGNOSIS — Z362 Encounter for other antenatal screening follow-up: Secondary | ICD-10-CM

## 2021-06-03 DIAGNOSIS — O09293 Supervision of pregnancy with other poor reproductive or obstetric history, third trimester: Secondary | ICD-10-CM

## 2021-06-03 DIAGNOSIS — J45909 Unspecified asthma, uncomplicated: Secondary | ICD-10-CM

## 2021-06-03 DIAGNOSIS — O99283 Endocrine, nutritional and metabolic diseases complicating pregnancy, third trimester: Secondary | ICD-10-CM | POA: Diagnosis not present

## 2021-06-03 DIAGNOSIS — O99513 Diseases of the respiratory system complicating pregnancy, third trimester: Secondary | ICD-10-CM | POA: Diagnosis not present

## 2021-06-05 ENCOUNTER — Encounter: Payer: Medicaid Other | Admitting: Physical Therapy

## 2021-06-12 ENCOUNTER — Other Ambulatory Visit: Payer: Self-pay

## 2021-06-12 ENCOUNTER — Encounter: Payer: Medicaid Other | Admitting: Physical Therapy

## 2021-06-12 ENCOUNTER — Ambulatory Visit (INDEPENDENT_AMBULATORY_CARE_PROVIDER_SITE_OTHER): Payer: Medicaid Other | Admitting: Obstetrics & Gynecology

## 2021-06-12 VITALS — BP 117/67 | HR 75 | Wt 171.0 lb

## 2021-06-12 DIAGNOSIS — O99280 Endocrine, nutritional and metabolic diseases complicating pregnancy, unspecified trimester: Secondary | ICD-10-CM

## 2021-06-12 DIAGNOSIS — O219 Vomiting of pregnancy, unspecified: Secondary | ICD-10-CM

## 2021-06-12 DIAGNOSIS — O0993 Supervision of high risk pregnancy, unspecified, third trimester: Secondary | ICD-10-CM

## 2021-06-12 DIAGNOSIS — Z3A33 33 weeks gestation of pregnancy: Secondary | ICD-10-CM

## 2021-06-12 DIAGNOSIS — E0590 Thyrotoxicosis, unspecified without thyrotoxic crisis or storm: Secondary | ICD-10-CM

## 2021-06-12 MED ORDER — PROMETHAZINE HCL 25 MG RE SUPP: 25.0000 mg | Freq: Four times a day (QID) | RECTAL | 0 refills | Status: DC | PRN

## 2021-06-12 NOTE — Progress Notes (Signed)
PRENATAL VISIT NOTE  Subjective:  Carla [name] is a 30 y.o. [phone] at [redacted]w[redacted]d being seen today for ongoing prenatal care.  She is currently monitored for the following issues for this high-risk pregnancy and has Cervical radiculopathy; Slow transit constipation; Supervision of high-risk pregnancy, third trimester; Hyperthyroidism affecting pregnancy, antepartum; Anemia, antepartum; Abnormal Pap smear of cervix; History of depression; Urticaria; Recurrent upper respiratory infection (URI); Migraine; Insomnia; Hyperthyroidism; Eczema; Bronchitis; Asthma; ADD (attention deficit disorder); Old vaginal laceration; Lightheadedness; Atypical chest pain; Orthostatic hypotension; Pregnancy related leg pain, antepartum, third trimester; and Anemia on their problem list.  Patient reports nausea not alleviated by current medications of Phenergan, Zofran, Scopolamine patch or GERD medication. Desires something else. Anything oral comes back up.  Contractions: Irritability. Vag. Bleeding: None.  Movement: Present. Denies leaking of fluid.   The following portions of the patient's history were reviewed and updated as appropriate: allergies, current medications, past family history, past medical history, past social history, past surgical history and problem list.   Objective:   Vitals:   06/12/21 1008  BP: 117/67  Pulse: 75  Weight: 171 lb (77.6 kg)    Fetal Status: Fetal Heart Rate (bpm): 143 Fundal Height: 34 cm Movement: Present     General:  Alert, oriented and cooperative. Patient is in no acute distress.  Skin: Skin is warm and dry. No rash noted.   Cardiovascular: Normal heart rate noted  Respiratory: Normal respiratory effort, no problems with respiration noted  Abdomen: Soft, gravid, appropriate for gestational age.  Pain/Pressure: Present     Pelvic: Cervical exam deferred        Extremities: Normal range of motion.  Edema: None  Mental Status: Normal mood and affect. Normal  behavior. Normal judgment and thought content.    [place] MFM OB FOLLOW UP  Result Date: 05/20/2021 ----------------------------------------------------------------------  OBSTETRICS REPORT                       (Signed Final 05/20/2021 08:52 am) ---------------------------------------------------------------------- Patient Info  ID #:       [id number]                          D.O.B.:  11-02-1991 (30 yrs)  Name:       Carla [name]                Visit Date: 05/20/2021 07:57 am              DONES ---------------------------------------------------------------------- Performed By  Attending:        Noralee Space MD        Secondary Phy.:   [facility]  Performed By:     Emeline Darling BS,      Address:          [address]                    [address]                                                             [address]  Referred By:      Rudean Curt             [facility]  [facility]                    [facility] CNM                               Fetal Care at                                                             [facility]                                                             Women  Ref. Address:     Faculty ---------------------------------------------------------------------- Orders  #  Description                           Code        Ordered By  1  [place] MFM OB FOLLOW UP                   [id number]    RAVI [facility] ----------------------------------------------------------------------  #  Order #                     Accession #                Episode #  1  [id number]                   [id number]                 [id number] ---------------------------------------------------------------------- Indications  [redacted] weeks gestation of pregnancy                Z3A.30  Hyperthyroid                                   O99.280 E05.90  Negative AFP/ Negative Horizon/ LR NIPS  Asthma                                         O99.89 j45.909  Poor obstetric history: Previous fetal growth  O09.299  restriction (FGR)   Encounter for other antenatal screening        Z36.2  follow-up  Anemia during pregnancy in third trimester     O99.013 ---------------------------------------------------------------------- Fetal Evaluation  Num Of Fetuses:         1  Fetal Heart Rate(bpm):  140  Cardiac Activity:       Observed  Presentation:           Cephalic  Placenta:               Right lateral  P. Cord Insertion:      Previously Visualized  Amniotic Fluid  AFI FV:      Subjectively decreased  AFI Sum(cm)     %Tile  Largest Pocket(cm)  6.8             < 3         2.1  RUQ(cm)       RLQ(cm)       LUQ(cm)        LLQ(cm)  2             1.9           2.1            0.8 ---------------------------------------------------------------------- Biometry  BPD:      72.3  mm     G. Age:  29w 0d          5  %    CI:        74.83   %    70 - 86                                                          FL/HC:      21.5   %    19.3 - 21.3  HC:      265.2  mm     G. Age:  28w 6d        < 1  %    HC/AC:      1.01        0.96 - 1.17  AC:      261.9  mm     G. Age:  30w 2d         38  %    FL/BPD:     79.0   %    71 - 87  FL:       57.1  mm     G. Age:  30w 0d         20  %    FL/AC:      21.8   %    20 - 24  Est. FW:    1485  gm      3 lb 4 oz     19  % ---------------------------------------------------------------------- OB History  Blood Type:   O+  Gravidity:    5         Term:   2        Prem:   0        SAB:   1  TOP:          1       Ectopic:  0        Living: 2 ---------------------------------------------------------------------- Gestational Age  LMP:           30w 4d        Date:  10/18/20                 EDD:   07/25/21  U/S Today:     29w 4d                                        EDD:   08/01/21  Best:          30w 4d     Det. By:  Marcella Dubs         EDD:  07/25/21                                      (12/24/20) ---------------------------------------------------------------------- Anatomy  Cranium:               Appears normal          Aortic Arch:            Previously seen  Cavum:                 Previously seen        Ductal Arch:            Previously seen  Ventricles:            Previously seen        Diaphragm:              Appears normal  Choroid Plexus:        Previously seen        Stomach:                Appears normal, left                                                                        sided  Cerebellum:            Previously seen        Abdomen:                Appears normal  Posterior Fossa:       Previously seen        Abdominal Wall:         Previously seen  Nuchal Fold:           Previously seen        Cord Vessels:           Previously seen  Face:                  Orbits and profile     Kidneys:                Appear normal                         previously seen  Lips:                  Previously seen        Bladder:                Appears normal  Thoracic:              Appears normal         Spine:                  Previously seen  Heart:                 Appears normal         Upper Extremities:      Previously seen                         (  4CH, axis, and                         situs)  RVOT:                  Previously seen        Lower Extremities:      Previously seen  LVOT:                  Previously seen  Other:  Fetus appears to be female. Heels/feet, open hands/5th digits,          Lenses, 3VV previously visualized.  Technically difficult due to fetal          position. ---------------------------------------------------------------------- Cervix Uterus Adnexa  Cervix  Not visualized (advanced GA >24wks) ---------------------------------------------------------------------- Impression  Patient returned for fetal growth assessment.  She had  hyperthyroidism (now euthyroid) and is not on antithyroid  medications.  She does not have gestational diabetes.  Patient reports she  had discontinued Procardia, prescribed because of uterine  contractions, because of hypotension.  Blood pressure today at her office is 114/64  mmHg.  Fetal growth is appropriate for gestational age.  Amniotic fluid  is decreased for this gestational age.  Ultrasound  measurement of the head was difficult because of its low  position.  Patient does not give history of leakage of amniotic fluid.  I explained the findings and encouraged her to increase  hydration. ---------------------------------------------------------------------- Recommendations  -An appointment was made for her to return in 2 weeks for  amniotic fluid assessment and in 4 weeks for fetal growth  assessment. ----------------------------------------------------------------------                  Noralee Space, MD Electronically Signed Final Report   05/20/2021 08:52 am ----------------------------------------------------------------------  [place] MFM OB LIMITED  Result Date: 06/03/2021 ----------------------------------------------------------------------  OBSTETRICS REPORT                       (Signed Final 06/03/2021 12:09 pm) ---------------------------------------------------------------------- Patient Info  ID #:       [id number]                          D.O.B.:  09-09-1991 (30 yrs)  Name:       Carla [name]                Visit Date: 06/03/2021 11:29 am              DONES ---------------------------------------------------------------------- Performed By  Attending:        Noralee Space MD        Secondary Phy.:   [facility]  Performed By:     Tommie Raymond BS,       Address:          [address]                    [address]                                                             [address]  Referred By:      Rudean Curt  Location[facility]         [facility]                    [facility] CNM                               [facility] at                                                             [facility]                                                             [facility]  Ref. Address:     Faculty ----------------------------------------------------------------------  Orders  #  Description                           Code        Ordered By  1  US MFM OB LIMITED                     [id number]    [facility] ----------------------------------------------------------------------  #  Order #                     Accession #                Episode #  1  [id number]                   [id number]                 [id number] ---------------------------------------------------------------------- Indications  [redacted] weeks gestation of pregnancy                Z3A.32  Hyperthyroid                                   O99.280 E05.90  Negative AFP/ Negative Horizon/ LR NIPS  Asthma                                         O99.89 j45.909  Poor obstetric history: Previous fetal growth  O09.299  restriction (FGR)  Encounter for other antenatal screening        Z36.2  follow-up  Anemia during pregnancy in third trimester     O99.013 ---------------------------------------------------------------------- Fetal Evaluation  Num Of Fetuses:         1  Fetal Heart Rate(bpm):  130  Cardiac Activity:       Observed  Presentation:           Cephalic  Placenta:               Right lateral  P. Cord Insertion:      Previously Visualized  Amniotic Fluid  AFI FV:      Within normal limits  AFI Sum(cm)     %Tile       Largest Pocket(cm)  11.5            28          6.9  RUQ(cm)       RLQ(cm)       LUQ(cm)        LLQ(cm)  6.9           0.9           3.6            0 ---------------------------------------------------------------------- OB History  Blood Type:   O+  Gravidity:    5         Term:   2        Prem:   0        SAB:   1  TOP:          1       Ectopic:  0        Living: 2 ---------------------------------------------------------------------- Gestational Age  LMP:           32w 4d        Date:  10/18/20                 EDD:   07/25/21  Best:          Armida Sans 4d     Det. ByMarcella Dubs         EDD:   07/25/21                                      (12/24/20)  ---------------------------------------------------------------------- Anatomy  Heart:                 Appears normal         Kidneys:                Appear normal                         (4CH, axis, and                         situs)  Diaphragm:             Appears normal         Bladder:                Appears normal  Stomach:               Appears normal, left                         sided ---------------------------------------------------------------------- Cervix Uterus Adnexa  Cervix  Not visualized (advanced GA >24wks)  Uterus  No abnormality visualized.  Right Ovary  Within normal limits.  Left Ovary  Within normal limits.  Cul De Sac  No free fluid seen.  Adnexa  No abnormality visualized. ---------------------------------------------------------------------- Impression  Patient returned for amniotic fluid assessment.  Low amniotic  fluid was seen on previous scan.  She reports good fetal  movements.  Blood pressure today at her office is 124/65 mmHg.  Amniotic fluid is normal and good fetal activity seen.  Fetal  heart rate and rhythm are normal.  We reassured the patient  of the findings. ---------------------------------------------------------------------- Recommendations  Fetal growth  assessment in 2 weeks. ----------------------------------------------------------------------                  Noralee Space, MD Electronically Signed Final Report   06/03/2021 12:09 pm ----------------------------------------------------------------------   Assessment and Plan:  Pregnancy: [id number] at [redacted]w[redacted]d 1. Nausea and vomiting during pregnancy Will try Phenergan suppositories, evaluate response. - promethazine (PHENERGAN) 25 MG suppository; Place 1 suppository (25 mg total) rectally every 6 (six) hours as needed for nausea or vomiting.  Dispense: 12 each; Refill: 0  2. Hyperthyroidism affecting pregnancy, antepartum Followed by Endocrinology and Cardio Obstetrics. Continue MFM scans as recommeded.  3. [redacted]  weeks gestation of pregnancy 4. Supervision of high-risk pregnancy, third trimester Preterm labor symptoms and general obstetric precautions including but not limited to vaginal bleeding, contractions, leaking of fluid and fetal movement were reviewed in detail with the patient. Please refer to After Visit Summary for other counseling recommendations.   Return in about 2 weeks (around 06/26/2021) for Pelvic cultures, OFFICE OB VISIT (MD only).  Future Appointments  Date Time Provider Department Center  06/17/2021  8:00 AM [facility] NURSE [facility]  06/17/2021  8:15 AM [facility] US2 [facility]  06/26/2021  3:30 PM Willodean Rosenthal, MD [facility] None  06/27/2021  8:45 AM [facility] LAB [facility] None  06/28/2021  1:15 PM Tobb, Kardie, DO [facility] None  07/04/2021  8:15 AM Levie Heritage, DO [facility] None  07/09/2021  3:30 PM Levert Feinstein, MD [facility] None  07/11/2021  8:15 AM Levie Heritage, DO [facility] None  07/18/2021  8:15 AM Levie Heritage, DO [facility] None  07/24/2021  8:15 AM Levie Heritage, DO [facility] None  08/30/2021 10:10 AM Shamleffer, Konrad Dolores, MD [facility] None    Jaynie Collins, MD

## 2021-06-12 NOTE — Patient Instructions (Signed)
Return to office for any scheduled appointments. Call the office or go to the MAU at [facility] if:  You begin to have strong, frequent contractions  Your water breaks.  Sometimes it is a big gush of fluid, sometimes it is just a trickle that keeps getting your panties wet or running down your legs  You have vaginal bleeding.  It is normal to have a small amount of spotting if your cervix was checked.   You do not feel your baby moving like normal.  If you do not, get something to eat and drink and lay down and focus on feeling your baby move.   If your baby is still not moving like normal, you should call the office or go to MAU.  Any other obstetric concerns.   

## 2021-06-17 ENCOUNTER — Ambulatory Visit: Payer: Medicaid Other | Admitting: *Deleted

## 2021-06-17 ENCOUNTER — Other Ambulatory Visit: Payer: Self-pay | Admitting: *Deleted

## 2021-06-17 ENCOUNTER — Ambulatory Visit: Payer: Medicaid Other | Attending: Obstetrics and Gynecology

## 2021-06-17 ENCOUNTER — Other Ambulatory Visit: Payer: Self-pay

## 2021-06-17 ENCOUNTER — Other Ambulatory Visit: Payer: Self-pay | Admitting: Obstetrics and Gynecology

## 2021-06-17 VITALS — BP 126/77 | HR 76

## 2021-06-17 DIAGNOSIS — O99019 Anemia complicating pregnancy, unspecified trimester: Secondary | ICD-10-CM | POA: Diagnosis present

## 2021-06-17 DIAGNOSIS — O09293 Supervision of pregnancy with other poor reproductive or obstetric history, third trimester: Secondary | ICD-10-CM

## 2021-06-17 DIAGNOSIS — E0590 Thyrotoxicosis, unspecified without thyrotoxic crisis or storm: Secondary | ICD-10-CM

## 2021-06-17 DIAGNOSIS — O99280 Endocrine, nutritional and metabolic diseases complicating pregnancy, unspecified trimester: Secondary | ICD-10-CM | POA: Insufficient documentation

## 2021-06-17 DIAGNOSIS — Z3A34 34 weeks gestation of pregnancy: Secondary | ICD-10-CM

## 2021-06-17 DIAGNOSIS — O365930 Maternal care for other known or suspected poor fetal growth, third trimester, not applicable or unspecified: Secondary | ICD-10-CM

## 2021-06-17 DIAGNOSIS — O99283 Endocrine, nutritional and metabolic diseases complicating pregnancy, third trimester: Secondary | ICD-10-CM | POA: Diagnosis not present

## 2021-06-17 DIAGNOSIS — O99013 Anemia complicating pregnancy, third trimester: Secondary | ICD-10-CM

## 2021-06-17 DIAGNOSIS — D649 Anemia, unspecified: Secondary | ICD-10-CM

## 2021-06-17 DIAGNOSIS — J45909 Unspecified asthma, uncomplicated: Secondary | ICD-10-CM

## 2021-06-17 DIAGNOSIS — O99513 Diseases of the respiratory system complicating pregnancy, third trimester: Secondary | ICD-10-CM

## 2021-06-17 DIAGNOSIS — O0993 Supervision of high risk pregnancy, unspecified, third trimester: Secondary | ICD-10-CM | POA: Diagnosis present

## 2021-06-17 NOTE — Progress Notes (Signed)
C/o" sharp mid chest pain, SOB, and dizziness x 2 days." O2 SAT 99%. Did not go to ED or notify OB MD/Cardiac MD.

## 2021-06-19 ENCOUNTER — Encounter: Payer: Medicaid Other | Admitting: Physical Therapy

## 2021-06-22 ENCOUNTER — Inpatient Hospital Stay (HOSPITAL_COMMUNITY): Payer: Medicaid Other

## 2021-06-22 ENCOUNTER — Inpatient Hospital Stay (HOSPITAL_COMMUNITY)
Admission: AD | Admit: 2021-06-22 | Discharge: 2021-06-22 | Disposition: A | Discharge status: Home / Self Care | Payer: Medicaid Other | Attending: Obstetrics and Gynecology | Admitting: Obstetrics and Gynecology

## 2021-06-22 ENCOUNTER — Encounter (HOSPITAL_COMMUNITY): Payer: Self-pay | Admitting: Obstetrics and Gynecology

## 2021-06-22 ENCOUNTER — Other Ambulatory Visit: Payer: Self-pay

## 2021-06-22 DIAGNOSIS — R519 Headache, unspecified: Secondary | ICD-10-CM | POA: Diagnosis not present

## 2021-06-22 DIAGNOSIS — O26893 Other specified pregnancy related conditions, third trimester: Secondary | ICD-10-CM | POA: Diagnosis not present

## 2021-06-22 DIAGNOSIS — R102 Pelvic and perineal pain: Secondary | ICD-10-CM | POA: Insufficient documentation

## 2021-06-22 DIAGNOSIS — Z3A35 35 weeks gestation of pregnancy: Secondary | ICD-10-CM

## 2021-06-22 DIAGNOSIS — Z79899 Other long term (current) drug therapy: Secondary | ICD-10-CM | POA: Diagnosis not present

## 2021-06-22 DIAGNOSIS — Z7951 Long term (current) use of inhaled steroids: Secondary | ICD-10-CM | POA: Insufficient documentation

## 2021-06-22 DIAGNOSIS — M549 Dorsalgia, unspecified: Secondary | ICD-10-CM | POA: Insufficient documentation

## 2021-06-22 DIAGNOSIS — R109 Unspecified abdominal pain: Secondary | ICD-10-CM | POA: Diagnosis not present

## 2021-06-22 LAB — CBC
HCT: 31.1 % — ABNORMAL LOW (ref 36.0–46.0)
Hemoglobin: 10.2 g/dL — ABNORMAL LOW (ref 12.0–15.0)
MCH: 28.7 pg (ref 26.0–34.0)
MCHC: 32.8 g/dL (ref 30.0–36.0)
MCV: 87.4 fL (ref 80.0–100.0)
Platelets: 238 10*3/uL (ref 150–400)
RBC: 3.56 MIL/uL — ABNORMAL LOW (ref 3.87–5.11)
RDW: 13.2 % (ref 11.5–15.5)
WBC: 8.9 10*3/uL (ref 4.0–10.5)
nRBC: 0 % (ref 0.0–0.2)

## 2021-06-22 LAB — PROTEIN / CREATININE RATIO, URINE
Creatinine, Urine: 40.14 mg/dL
Total Protein, Urine: <6 mg/dL

## 2021-06-22 LAB — COMPREHENSIVE METABOLIC PANEL
ALT: 17 U/L (ref 0–44)
AST: 19 U/L (ref 15–41)
Albumin: 2.5 g/dL — ABNORMAL LOW (ref 3.5–5.0)
Alkaline Phosphatase: 131 U/L — ABNORMAL HIGH (ref 38–126)
Anion gap: 7 (ref 5–15)
BUN: <5 mg/dL — ABNORMAL LOW (ref 6–20)
CO2: 24 mmol/L (ref 22–32)
Calcium: 8.6 mg/dL — ABNORMAL LOW (ref 8.9–10.3)
Chloride: 104 mmol/L (ref 98–111)
Creatinine, Ser: 0.69 mg/dL (ref 0.44–1.00)
GFR, Estimated: >60 mL/min (ref >60)
Glucose, Bld: 92 mg/dL (ref 70–99)
Potassium: 3.8 mmol/L (ref 3.5–5.1)
Sodium: 135 mmol/L (ref 135–145)
Total Bilirubin: <0.1 mg/dL — ABNORMAL LOW (ref 0.3–1.2)
Total Protein: 5.7 g/dL — ABNORMAL LOW (ref 6.5–8.1)

## 2021-06-22 MED ORDER — LACTATED RINGERS IV BOLUS
1000.0000 mL | Freq: Once | INTRAVENOUS | Status: AC
  Administered 2021-06-22: 1000 mL via INTRAVENOUS

## 2021-06-22 MED ORDER — METOCLOPRAMIDE HCL 5 MG/ML IJ SOLN
10.0000 mg | Freq: Once | INTRAMUSCULAR | Status: DC
  Filled 2021-06-22: qty 2

## 2021-06-22 MED ORDER — DEXAMETHASONE SODIUM PHOSPHATE 10 MG/ML IJ SOLN
10.0000 mg | Freq: Once | INTRAMUSCULAR | Status: AC
  Administered 2021-06-22: 10 mg via INTRAVENOUS
  Filled 2021-06-22: qty 1

## 2021-06-22 MED ORDER — ACETAMINOPHEN 500 MG PO TABS
1000.0000 mg | ORAL_TABLET | Freq: Once | ORAL | Status: AC
  Administered 2021-06-22: 1000 mg via ORAL
  Filled 2021-06-22: qty 2

## 2021-06-22 MED ORDER — METOCLOPRAMIDE HCL 10 MG PO TABS
10.0000 mg | ORAL_TABLET | Freq: Once | ORAL | Status: AC
  Administered 2021-06-22: 10 mg via ORAL
  Filled 2021-06-22: qty 1

## 2021-06-22 MED ORDER — DIPHENHYDRAMINE HCL 50 MG/ML IJ SOLN
25.0000 mg | Freq: Once | INTRAMUSCULAR | Status: AC
  Administered 2021-06-22: 25 mg via INTRAVENOUS
  Filled 2021-06-22: qty 1
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
## 2021-06-22 NOTE — MAU Provider Note (Addendum)
History     CSN: [id number]  Arrival date and time: 06/22/21 0605     Chief Complaint  Patient presents with   Abdominal Pain   Headache   Carla [name] is a 30 y.o. [id number] at [redacted]w[redacted]d who receives care at [facility].  She presents today for Back pain, Pelvic Pain, and Headache. She reports that she is having stabbing pain in the pelvic area that is constant and has no aggravating or relieving factors.  She reports it started yesterday around 1730.  She rates the pain a 8/10.  She states her back pain is "more of a radiating pain that comes and goes."  She reports it started two days ago and also has no relieving or aggravating factors. She reports a history of migraines that was treated with "mertec, fioricet, and maxall prior to pregnancy."  She states her HA started around 0300 and is located in the temples, eyes, and back of head.  She states she has "headaches everyday, but this is a different type of headache." She states it is different b/c of the way it feels.  She reports taking fioricet, phenergan, and flexeril without relief at 0300.  She reports moving her eyes and light makes the HA worse. She rates the HA a 8/10.  She endorses fetal movement and denies abdominal cramping.  She reports some intermittent contractions. She denies vaginal concerns including bleeding, leaking, and discharge.    OB History     Gravida  5   Para  2   Term  2   Preterm      AB  2   Living  2      SAB  1   IAB  1   Ectopic      Multiple      Live Births  2           Past Medical History:  Diagnosis Date   ADD (attention deficit disorder)    Anemia    Asthma    Bronchitis    Eczema    Heart palpitations    Hyperthyroidism    Insomnia    Migraine    Recurrent upper respiratory infection (URI)    Urticaria     Past Surgical History:  Procedure Laterality Date   ADENOIDECTOMY     NASAL SEPTOPLASTY W/ TURBINOPLASTY  2017   SINOSCOPY     TONSILLECTOMY     VAGINA  RECONSTRUCTION SURGERY N/A     Family History  Problem Relation Age of Onset   Hypertension Mother    Hypertension Father    Diabetes Father    Diabetes Brother    Diabetes Maternal Grandmother    Stroke Maternal Grandmother    Diabetes Paternal Grandfather    Heart disease Neg Hx     Social History   Tobacco Use   Smoking status: Former    Packs/day: 0.25    Years: 4.00    Pack years: 1.00    Types: Cigarettes    Quit date: 08/18/2018    Years since quitting: 2.8   Smokeless tobacco: Never   Tobacco comments:    patient quit 2 weeks ago. 09-01-18  Vaping Use   Vaping Use: Never used  Substance Use Topics   Alcohol use: Never   Drug use: Never    Allergies:  Allergies  Allergen Reactions   Latex Hives    Medications Prior to Admission  Medication Sig Dispense Refill Last Dose   albuterol (PROVENTIL HFA;VENTOLIN HFA)  108 (90 Base) MCG/ACT inhaler Inhale 2 puffs into the lungs every 6 (six) hours as needed for wheezing or shortness of breath.   06/21/2021   Butalbital-APAP-Caffeine 50-325-40 MG capsule Take 1-2 capsules by mouth every 6 (six) hours as needed for headache. 30 capsule 3 06/22/2021 at 0300   cyclobenzaprine (FLEXERIL) 10 MG tablet Take 1 tablet (10 mg total) by mouth 3 (three) times daily as needed for muscle spasms. 40 tablet 2 06/22/2021 at 0300   ferrous sulfate (FERROUSUL) 325 (65 FE) MG tablet Take 1 tablet (325 mg total) by mouth every other day. 60 tablet 1 Past Week   ondansetron (ZOFRAN ODT) 4 MG disintegrating tablet Take 1 tablet (4 mg total) by mouth every 8 (eight) hours as needed for nausea or vomiting. 20 tablet 0 Past Week   promethazine (PHENERGAN) 25 MG tablet Take 1 tablet (25 mg total) by mouth every 6 (six) hours as needed for nausea or vomiting. 30 tablet 0 06/22/2021 at 0300   Ascorbic Acid (VITAMIN C) 1000 MG tablet Take 1,000 mg by mouth daily. (Patient not taking: Reported on 06/12/2021)      budesonide-formoterol (SYMBICORT) 160-4.5  MCG/ACT inhaler Inhale 2 puffs into the lungs 2 (two) times daily. (Patient not taking: Reported on 06/12/2021)      cholecalciferol (VITAMIN D3) 25 MCG (1000 UNIT) tablet Take 1,000 Units by mouth daily. (Patient not taking: Reported on 06/12/2021)      Prenatal Vit-Fe Fumarate-FA (PRENATAL MULTIVITAMIN) TABS tablet Take 1 tablet by mouth daily at 12 noon. (Patient not taking: Reported on 06/12/2021)      promethazine (PHENERGAN) 25 MG suppository Place 1 suppository (25 mg total) rectally every 6 (six) hours as needed for nausea or vomiting. 12 each 0    scopolamine (TRANSDERM-SCOP, 1.5 MG,) 1 MG/3DAYS Place 1 patch (1.5 mg total) onto the skin every 3 (three) days. (Patient not taking: Reported on 06/12/2021) 10 patch 12     Review of Systems  Eyes:  Negative for visual disturbance.  Gastrointestinal:  Negative for constipation, diarrhea, nausea and vomiting.  Genitourinary:  Positive for pelvic pain. Negative for difficulty urinating, dysuria, vaginal bleeding and vaginal discharge.  Musculoskeletal:  Positive for back pain.  Neurological:  Positive for dizziness, light-headedness and headaches.  Physical Exam   Blood pressure 121/69, pulse 72, temperature 98.7 F (37.1 C), temperature source Oral, resp. rate 20, height 5\' 9"  (1.753 m), weight 79.6 kg, SpO2 100 %, unknown if currently breastfeeding.  Physical Exam Constitutional:      Appearance: She is well-developed.  HENT:     Head: Normocephalic and atraumatic.  Cardiovascular:     Rate and Rhythm: Normal rate.  Skin:    General: Skin is warm and dry.  Neurological:     Mental Status: She is alert and oriented to person, place, and time.  Psychiatric:        Mood and Affect: Mood normal.        Behavior: Behavior normal.    Fetal Assessment 145 bpm, Mod Var, -Decels, +Accels Toco: No ctx graphed  MAU Course   Results for orders placed or performed during the hospital encounter of 06/22/21 (from the past 24 hour(s))   Protein / creatinine ratio, urine     Status: None   Collection Time: 06/22/21  9:10 AM  Result Value Ref Range   Creatinine, Urine 40.14 mg/dL   Total Protein, Urine <6 mg/dL   Protein Creatinine Ratio        0.00 -  0.15 mg/mg[Cre]  CBC     Status: Abnormal   Collection Time: 06/22/21  9:29 AM  Result Value Ref Range   WBC 8.9 4.0 - 10.5 K/uL   RBC 3.56 (L) 3.87 - 5.11 MIL/uL   Hemoglobin 10.2 (L) 12.0 - 15.0 g/dL   HCT 16.131.1 (L) 09.636.0 - 04.546.0 %   MCV 87.4 80.0 - 100.0 fL   MCH 28.7 26.0 - 34.0 pg   MCHC 32.8 30.0 - 36.0 g/dL   RDW 40.913.2 81.111.5 - 91.415.5 %   Platelets 238 150 - 400 K/uL   nRBC 0.0 0.0 - 0.2 %  Comprehensive metabolic panel     Status: Abnormal   Collection Time: 06/22/21  9:29 AM  Result Value Ref Range   Sodium 135 135 - 145 mmol/L   Potassium 3.8 3.5 - 5.1 mmol/L   Chloride 104 98 - 111 mmol/L   CO2 24 22 - 32 mmol/L   Glucose, Bld 92 70 - 99 mg/dL   BUN <5 (L) 6 - 20 mg/dL   Creatinine, Ser 7.820.69 0.44 - 1.00 mg/dL   Calcium 8.6 (L) 8.9 - 10.3 mg/dL   Total Protein 5.7 (L) 6.5 - 8.1 g/dL   Albumin 2.5 (L) 3.5 - 5.0 g/dL   AST 19 15 - 41 U/L   ALT 17 0 - 44 U/L   Alkaline Phosphatase 131 (H) 38 - 126 U/L   Total Bilirubin <0.1 (L) 0.3 - 1.2 mg/dL   GFR, Estimated >95>60 >62>60 mL/min   Anion gap 7 5 - 15   CT HEAD WO CONTRAST  Result Date: 06/22/2021 CLINICAL DATA:  Headache EXAM: CT HEAD WITHOUT CONTRAST TECHNIQUE: Contiguous axial images were obtained from the base of the skull through the vertex without intravenous contrast. COMPARISON:  None. FINDINGS: Brain: No evidence of acute infarction, hemorrhage, hydrocephalus, extra-axial collection or mass lesion/mass effect. Vascular: No hyperdense vessel or unexpected calcification. Skull: Normal. Negative for fracture or focal lesion. Sinuses/Orbits: No acute finding. Other: None. IMPRESSION: No acute intracranial abnormality. Electronically Signed   By: Acquanetta BellingFarhaan  Mir M.D.   On: 06/22/2021 12:07    MDM PE Labs: CBC,  CMP, UPCR wnl EFM reactive and reassuring for gestational age 39 mild range BP at 141/71, however all other BP's normotensive  Assessment and Plan  30 year old [id number]  SIUP at 35.2 weeks Cat I FT Abdominal Pain Headache  -POC Reviewed -Exam performed. -Patient states she has support person who can come get her if necessary. -Will start with HA cocktail including benadryl, reglan, and decadron. -Explained medications and route of administration to patient. -Discussed expectation of pelvic pain in late pregnancy. -Encouraged usage of maternity support band or warm/cold compresses to promote comfort. -NST Reactive -Will monitor and reassess.   Assumed care of patient at 0800 from [facility] [place].   Patient was only given IV Decadron and Benadryl given that she reports itching after receiving both medications. Itching was short lived and resolved soon after.  0900: On reassessment, patient continues to rate HA 8/10. CBC, CMP, and UPCR ordered along with head CT and Tylenol and Reglan PO ordered 1230: Head CT with results as above and normal. Headache remains that same. 1 dose of Morphine was offered but patient declines. Reassurance provided to patient that we have ruled out emergencies including pre-eclampsia and any neurological emergencies. Patient is comfortable and agreeable to being discharged home. Patient has OB appointment scheduled on Monday 8/1 and neurology appointment on 8/16. Recommend patient eat  given that she has not had much to eat since yesterday as well as sleep.  Discussed with patient strict return precautions.    Patient discharged home in stable condition Strict return precautions reviewed Follow up with OB on Monday and Neurology on 8/16 Return to MAU as needed     Camelia Eng, MSN, CNM 06/22/21 12:52 PM

## 2021-06-24 ENCOUNTER — Ambulatory Visit: Payer: Medicaid Other | Admitting: *Deleted

## 2021-06-24 ENCOUNTER — Other Ambulatory Visit: Payer: Self-pay

## 2021-06-24 ENCOUNTER — Encounter: Payer: Self-pay | Admitting: *Deleted

## 2021-06-24 ENCOUNTER — Ambulatory Visit: Payer: Medicaid Other | Attending: Obstetrics

## 2021-06-24 VITALS — BP 133/87 | HR 73

## 2021-06-24 DIAGNOSIS — E0590 Thyrotoxicosis, unspecified without thyrotoxic crisis or storm: Secondary | ICD-10-CM

## 2021-06-24 DIAGNOSIS — O0993 Supervision of high risk pregnancy, unspecified, third trimester: Secondary | ICD-10-CM

## 2021-06-24 DIAGNOSIS — O99019 Anemia complicating pregnancy, unspecified trimester: Secondary | ICD-10-CM

## 2021-06-24 DIAGNOSIS — R87612 Low grade squamous intraepithelial lesion on cytologic smear of cervix (LGSIL): Secondary | ICD-10-CM | POA: Insufficient documentation

## 2021-06-24 DIAGNOSIS — O99280 Endocrine, nutritional and metabolic diseases complicating pregnancy, unspecified trimester: Secondary | ICD-10-CM | POA: Insufficient documentation

## 2021-06-24 DIAGNOSIS — O365930 Maternal care for other known or suspected poor fetal growth, third trimester, not applicable or unspecified: Secondary | ICD-10-CM

## 2021-06-24 NOTE — Progress Notes (Signed)
C/O SOB and occas sharp chest pain. O2 sat 100%. Currently appears stable. Encouraged to go to [facility] with further concerns or worsening sxs.

## 2021-06-26 ENCOUNTER — Ambulatory Visit (INDEPENDENT_AMBULATORY_CARE_PROVIDER_SITE_OTHER): Payer: Medicaid Other | Admitting: Obstetrics & Gynecology

## 2021-06-26 ENCOUNTER — Other Ambulatory Visit (HOSPITAL_COMMUNITY)
Admission: RE | Admit: 2021-06-26 | Discharge: 2021-06-26 | Disposition: A | Discharge status: Home / Self Care | Payer: Medicaid Other | Source: Ambulatory Visit | Attending: Obstetrics & Gynecology | Admitting: Obstetrics & Gynecology

## 2021-06-26 ENCOUNTER — Other Ambulatory Visit: Payer: Self-pay

## 2021-06-26 VITALS — BP 124/82 | HR 98 | Wt 174.0 lb

## 2021-06-26 DIAGNOSIS — E0590 Thyrotoxicosis, unspecified without thyrotoxic crisis or storm: Secondary | ICD-10-CM

## 2021-06-26 DIAGNOSIS — O99280 Endocrine, nutritional and metabolic diseases complicating pregnancy, unspecified trimester: Secondary | ICD-10-CM

## 2021-06-26 DIAGNOSIS — O365990 Maternal care for other known or suspected poor fetal growth, unspecified trimester, not applicable or unspecified: Secondary | ICD-10-CM

## 2021-06-26 DIAGNOSIS — O0993 Supervision of high risk pregnancy, unspecified, third trimester: Secondary | ICD-10-CM | POA: Insufficient documentation

## 2021-06-26 DIAGNOSIS — O99019 Anemia complicating pregnancy, unspecified trimester: Secondary | ICD-10-CM

## 2021-06-26 NOTE — Progress Notes (Signed)
Patient states she took 800mg  of ibuprofen 2 hours ago. RN

## 2021-06-27 ENCOUNTER — Other Ambulatory Visit (INDEPENDENT_AMBULATORY_CARE_PROVIDER_SITE_OTHER): Payer: Medicaid Other

## 2021-06-27 DIAGNOSIS — E0590 Thyrotoxicosis, unspecified without thyrotoxic crisis or storm: Secondary | ICD-10-CM

## 2021-06-27 LAB — TSH: TSH: 1.32 u[IU]/mL (ref 0.35–5.50)

## 2021-06-28 ENCOUNTER — Other Ambulatory Visit: Payer: Self-pay

## 2021-06-28 ENCOUNTER — Encounter: Payer: Self-pay | Admitting: Cardiology

## 2021-06-28 ENCOUNTER — Ambulatory Visit (INDEPENDENT_AMBULATORY_CARE_PROVIDER_SITE_OTHER): Payer: Medicaid Other | Admitting: Cardiology

## 2021-06-28 VITALS — BP 135/85 | HR 100 | Ht 69.0 in | Wt 176.0 lb

## 2021-06-28 DIAGNOSIS — R0789 Other chest pain: Secondary | ICD-10-CM

## 2021-06-28 DIAGNOSIS — R002 Palpitations: Secondary | ICD-10-CM

## 2021-06-28 DIAGNOSIS — R030 Elevated blood-pressure reading, without diagnosis of hypertension: Secondary | ICD-10-CM

## 2021-06-28 LAB — T4: T4, Total: 12 ug/dL — ABNORMAL HIGH (ref 5.1–11.9)

## 2021-06-28 LAB — GC/CHLAMYDIA PROBE AMP (~~LOC~~) NOT AT ARMC
Chlamydia: NEGATIVE
Comment: NEGATIVE
Comment: NORMAL
Neisseria Gonorrhea: NEGATIVE

## 2021-06-28 LAB — T3: T3, Total: 205 ng/dL — ABNORMAL HIGH (ref 76–181)

## 2021-06-28 MED ORDER — NIFEDIPINE 10 MG PO CAPS: 10.0000 mg | ORAL_CAPSULE | Freq: Two times a day (BID) | ORAL | 3 refills | Status: DC

## 2021-06-28 NOTE — Progress Notes (Signed)
Cardio-Obstetrics Clinic  Follow Up Note   Date:  06/28/2021   ID:  Carla [name], DOB [birth year], MRN [id number]  PCP:  Oneita Hurt, No   [facility] Providers Cardiologist:  Thomasene Ripple, DO  Electrophysiologist:  None        Referring MD: Abigail Miyamoto,*   Chief Complaint: Shortness of breath, elevated blood pressure recently  History of Present Illness:    Carla [name] is a 30 y.o. female [G5P2022] who returns for follow up.  I initially saw the patient January 29, 2021 at that time she presented due to increasing palpitation and shortness of breath.  At the conclusion of our visit we will review her previous monitor which was normal as well as refer the patient for echocardiogram.  At her visit on March 12, 2019 to review her testing result.  No changes were made to medications.  We talked about the sinus tachycardia which could be normal physiologic state of her pregnancy.  Since her last saw the patient she tells me that her blood pressure has been elevated and recently has been staying up over the 130s.  Plans at this time.  Prior CV Studies Reviewed: The following studies were reviewed today:  Zio Monitor  The patient wore the monitor for 14 days starting January 29, 2021.   Indication: Palpitations   The minimum heart rate was 57 bpm, maximum heart rate was 174 bpm, and average heart rate was 86 bpm. Predominant underlying rhythm was Sinus Rhythm.     Premature atrial complexes were rare less than 1%. Premature Ventricular complexes were rare less than 1%.   No ventricular tachycardia, no significant pauses, No AV block and no atrial fibrillation present.   A patient triggered events and 7 diary events are associated with sinus rhythm and sinus tachycardia.   Conclusion: Normal/unremarkable study.  We will have to monitor the patient closely as I do suspect that inappropriate sinus tachycardia may be playing a role here.     Transthoracic  echocardiogram 02/27/2021 IMPRESSIONS   1. Left ventricular ejection fraction, by estimation, is 60 to 65%. The left ventricle has normal function. The left ventricle has no regional wall motion abnormalities. Left ventricular diastolic parameters were  normal.  2. Right ventricular systolic function is normal. The right ventricular size is normal. There is normal pulmonary artery systolic pressure.   3. The mitral valve is normal in structure. Trivial mitral valve regurgitation. No evidence of mitral stenosis.   4. The aortic valve is normal in structure. Aortic valve regurgitation is not visualized. No aortic stenosis is present.   5. The inferior vena cava is normal in size with greater than 50% respiratory variability, suggesting right atrial pressure of 3 mmHg.   FINDINGS   Left Ventricle: Left ventricular ejection fraction, by estimation, is 60 to 65%. The left ventricle has normal function. The left ventricle has no regional wall motion abnormalities. The left ventricular internal cavity  size was normal in size. There is  no left ventricular hypertrophy. Left ventricular diastolic parameters  were normal.   Right Ventricle: The right ventricular size is normal. No increase in right ventricular wall thickness. Right ventricular systolic function is normal. There is normal pulmonary artery systolic pressure. The tricuspid  regurgitant velocity is 1.76 m/s, and  with an assumed right atrial pressure of 3 mmHg, the estimated right ventricular systolic pressure is 15.4 mmHg.   Left Atrium: Left atrial size was normal in size.   Right Atrium: Right  atrial size was normal in size.   Pericardium: There is no evidence of pericardial effusion.   Mitral Valve: The mitral valve is normal in structure. Trivial mitral  valve regurgitation. No evidence of mitral valve stenosis.   Tricuspid Valve: The tricuspid valve is normal in structure. Tricuspid  valve regurgitation is not demonstrated. No  evidence of tricuspid  stenosis.   Aortic Valve: The aortic valve is normal in structure. Aortic valve  regurgitation is not visualized. No aortic stenosis is present.   Pulmonic Valve: The pulmonic valve was normal in structure. Pulmonic valve  regurgitation is not visualized. No evidence of pulmonic stenosis.   Aorta: The aortic root is normal in size and structure.   Venous: The inferior vena cava is normal in size with greater than 50%  respiratory variability, suggesting right atrial pressure of 3 mmHg.   IAS/Shunts: No atrial level shunt detected by color flow Doppler.     ZIO monitor which was in December 2021 The basic rhythm is normal sinus with an average HR of 85 bpm, periods of sinus tachycardia noted No atrial fibrillation or flutter No high-grade heart block or pathologic pauses There are rare PVC's and rare supraventricular beats without sustained arrhythmias  Past Medical History:  Diagnosis Date   ADD (attention deficit disorder)    Anemia    Asthma    Bronchitis    Eczema    Heart palpitations    Hyperthyroidism    Insomnia    Migraine    Recurrent upper respiratory infection (URI)    Urticaria     Past Surgical History:  Procedure Laterality Date   ADENOIDECTOMY     NASAL SEPTOPLASTY W/ TURBINOPLASTY  2017   SINOSCOPY     TONSILLECTOMY     VAGINA RECONSTRUCTION SURGERY N/A       OB History     Gravida  5   Para  2   Term  2   Preterm      AB  2   Living  2      SAB  1   IAB  1   Ectopic      Multiple      Live Births  2               Current Medications: Current Meds  Medication Sig   albuterol (PROVENTIL HFA;VENTOLIN HFA) 108 (90 Base) MCG/ACT inhaler Inhale 2 puffs into the lungs every 6 (six) hours as needed for wheezing or shortness of breath.   Ascorbic Acid (VITAMIN C) 1000 MG tablet Take 1,000 mg by mouth daily.   budesonide-formoterol (SYMBICORT) 160-4.5 MCG/ACT inhaler Inhale 2 puffs into the lungs 2 (two)  times daily.   Butalbital-APAP-Caffeine 50-325-40 MG capsule Take 1-2 capsules by mouth every 6 (six) hours as needed for headache.   cholecalciferol (VITAMIN D3) 25 MCG (1000 UNIT) tablet Take 1,000 Units by mouth daily.   cyclobenzaprine (FLEXERIL) 10 MG tablet Take 1 tablet (10 mg total) by mouth 3 (three) times daily as needed for muscle spasms.   ferrous sulfate (FERROUSUL) 325 (65 FE) MG tablet Take 1 tablet (325 mg total) by mouth every other day.   NIFEdipine (PROCARDIA) 10 MG capsule Take 1 capsule (10 mg total) by mouth in the morning and at bedtime.   ondansetron (ZOFRAN ODT) 4 MG disintegrating tablet Take 1 tablet (4 mg total) by mouth every 8 (eight) hours as needed for nausea or vomiting.   Prenatal Vit-Fe Fumarate-FA (PRENATAL MULTIVITAMIN) TABS tablet  Take 1 tablet by mouth daily at 12 noon.   promethazine (PHENERGAN) 25 MG suppository Place 1 suppository (25 mg total) rectally every 6 (six) hours as needed for nausea or vomiting.   promethazine (PHENERGAN) 25 MG tablet Take 1 tablet (25 mg total) by mouth every 6 (six) hours as needed for nausea or vomiting.   scopolamine (TRANSDERM-SCOP, 1.5 MG,) 1 MG/3DAYS Place 1 patch (1.5 mg total) onto the skin every 3 (three) days.     Allergies:   Latex   Social History   Socioeconomic History   Marital status: Single    Spouse name: Not on file   Number of children: 2   Years of education: Not on file   Highest education level: Some college, no degree  Occupational History   Not on file  Tobacco Use   Smoking status: Former    Packs/day: 0.25    Years: 4.00    Pack years: 1.00    Types: Cigarettes    Quit date: 08/18/2018    Years since quitting: 2.8   Smokeless tobacco: Never   Tobacco comments:    patient quit 2 weeks ago. 09-01-18  Vaping Use   Vaping Use: Never used  Substance and Sexual Activity   Alcohol use: Never   Drug use: Never   Sexual activity: Yes  Other Topics Concern   Not on file  Social History  Narrative   Not on file   Social Determinants of Health   Financial Resource Strain: Not on file  Food Insecurity: Not on file  Transportation Needs: Not on file  Physical Activity: Not on file  Stress: Not on file  Social Connections: Not on file      Family History  Problem Relation Age of Onset   Hypertension Mother    Hypertension Father    Diabetes Father    Diabetes Brother    Diabetes Maternal Grandmother    Stroke Maternal Grandmother    Diabetes Paternal Grandfather    Heart disease Neg Hx       ROS:   Please see the history of present illness.     All other systems reviewed and are negative.   Labs/EKG Reviewed:    EKG:   None today   Recent Labs: 03/11/2021: Magnesium 1.6 06/22/2021: ALT 17; BUN <5; Creatinine, Ser 0.69; Hemoglobin 10.2; Platelets 238; Potassium 3.8; Sodium 135 06/27/2021: TSH 1.32   Recent Lipid Panel No results found for: CHOL, TRIG, HDL, CHOLHDL, LDLCALC, LDLDIRECT  Physical Exam:    VS:  BP 135/85 (BP Location: Right Arm, Patient Position: Sitting)   Pulse 100   Ht 5\' 9"  (1.753 m)   Wt 176 lb (79.8 kg)   LMP  (LMP Unknown) Comment: reports irreg cycles, ? LMP sometime in Oct, spotted ~11/3  SpO2 100%   BMI 25.99 kg/m     Wt Readings from Last 3 Encounters:  06/28/21 176 lb (79.8 kg)  06/26/21 174 lb (78.9 kg)  06/22/21 175 lb 8 oz (79.6 kg)     GEN:  Well nourished, well developed in no acute distress HEENT: Normal NECK: No JVD; No carotid bruits LYMPHATICS: No lymphadenopathy CARDIAC: RRR, no murmurs, rubs, gallops RESPIRATORY:  Clear to auscultation without rales, wheezing or rhonchi  ABDOMEN: Soft, non-tender, non-distended MUSCULOSKELETAL:  No edema; No deformity  SKIN: Warm and dry NEUROLOGIC:  Alert and oriented x 3 PSYCHIATRIC:  Normal affect    Risk Assessment/Risk Calculators:     CARPREG II Risk Prediction Index  Score:  1.  The patient's risk for a primary cardiac event is 5%.             ASSESSMENT & PLAN:   Elevated blood pressure Palpitations Atypical chest pain    Her blood pressure is elevated in the office today.  Patient tells me that at home she has been averaging systolics high 130s And diastolics in the 90s.  We will going to monitor the patient closely and follow her closely postpartum to make sure that her blood pressure does not exacerbate.  I have asked her to take her blood pressure daily to her systolic be over 654 and diastolic remain above 90 to take Procardia 10 mg twice a day-remaining cautious because in her early pregnancy she did have lower blood pressure with some issues of orthostatic hypotension.  She tells me that if she does not deliver by next Thursday she is going to be induced.  We're going to set up a close follow-up with a virtual blood pressure visit 1 week postpartum.  Was spoken to monitor the patient postpartum she still is experiencing atypical chest pain if this persist postpartum we will reassess for need for any further testing.     Patient Instructions  Medication Instructions:  Your physician has recommended you make the following change in your medication:  START: Nifedipine 10 mg twice daily if systolic (top) blood pressure reading is greater then 140.  *If you need a refill on your cardiac medications before your next appointment, please call your pharmacy*   Lab Work: None If you have labs (blood work) drawn today and your tests are completely normal, you will receive your results only by: MyChart Message (if you have MyChart) OR A paper copy in the mail If you have any lab test that is abnormal or we need to change your treatment, we will call you to review the results.   Testing/Procedures: None   Follow-Up: At [facility], you and your health needs are our priority.  As part of our continuing mission to provide you with exceptional heart care, we have created designated Provider Care Teams.  These Care  Teams include your primary Cardiologist (physician) and Advanced Practice Providers (APPs -  Physician Assistants and Nurse Practitioners) who all work together to provide you with the care you need, when you need it.  We recommend signing up for the patient portal called "MyChart".  Sign up information is provided on this After Visit Summary.  MyChart is used to connect with patients for Virtual Visits (Telemedicine).  Patients are able to view lab/test results, encounter notes, upcoming appointments, etc.  Non-urgent messages can be sent to your provider as well.   To learn more about what you can do with MyChart, go to [email or web address].    Your next appointment:   August 18th   The format for your next appointment:   Virtual Visit   Provider:   Thomasene Ripple [facility] [address], [place], [place] [address]    Other Instructions    Dispo:  No follow-ups on file.   Medication Adjustments/Labs and Tests Ordered: Current medicines are reviewed at length with the patient today.  Concerns regarding medicines are outlined above.  Tests Ordered: No orders of the defined types were placed in this encounter.  Medication Changes: Meds ordered this encounter  Medications   NIFEdipine (PROCARDIA) 10 MG capsule    Sig: Take 1 capsule (10 mg total) by mouth in the morning and  at bedtime.    Dispense:  90 capsule    Refill:  3

## 2021-06-28 NOTE — Patient Instructions (Addendum)
Medication Instructions:  Your physician has recommended you make the following change in your medication:  START: Nifedipine 10 mg twice daily if systolic (top) blood pressure reading is greater then 140.  *If you need a refill on your cardiac medications before your next appointment, please call your pharmacy*   Lab Work: None If you have labs (blood work) drawn today and your tests are completely normal, you will receive your results only by: MyChart Message (if you have MyChart) OR A paper copy in the mail If you have any lab test that is abnormal or we need to change your treatment, we will call you to review the results.   Testing/Procedures: None   Follow-Up: At [facility], you and your health needs are our priority.  As part of our continuing mission to provide you with exceptional heart care, we have created designated Provider Care Teams.  These Care Teams include your primary Cardiologist (physician) and Advanced Practice Providers (APPs -  Physician Assistants and Nurse Practitioners) who all work together to provide you with the care you need, when you need it.  We recommend signing up for the patient portal called "MyChart".  Sign up information is provided on this After Visit Summary.  MyChart is used to connect with patients for Virtual Visits (Telemedicine).  Patients are able to view lab/test results, encounter notes, upcoming appointments, etc.  Non-urgent messages can be sent to your provider as well.   To learn more about what you can do with MyChart, go to [email or web address].    Your next appointment:   August 18th   The format for your next appointment:   Virtual Visit   Provider:   Thomasene Ripple [facility] [address] [place], [place] [address]    Other Instructions

## 2021-06-30 ENCOUNTER — Other Ambulatory Visit: Payer: Self-pay

## 2021-06-30 ENCOUNTER — Inpatient Hospital Stay (HOSPITAL_COMMUNITY)
Admission: AD | Admit: 2021-06-30 | Discharge: 2021-06-30 | Disposition: A | Discharge status: Home / Self Care | Payer: Medicaid Other | Attending: Obstetrics and Gynecology | Admitting: Obstetrics and Gynecology

## 2021-06-30 DIAGNOSIS — O1203 Gestational edema, third trimester: Secondary | ICD-10-CM | POA: Diagnosis not present

## 2021-06-30 DIAGNOSIS — Z79899 Other long term (current) drug therapy: Secondary | ICD-10-CM | POA: Insufficient documentation

## 2021-06-30 DIAGNOSIS — Z91040 Latex allergy status: Secondary | ICD-10-CM | POA: Insufficient documentation

## 2021-06-30 DIAGNOSIS — Z87891 Personal history of nicotine dependence: Secondary | ICD-10-CM | POA: Diagnosis not present

## 2021-06-30 DIAGNOSIS — Z711 Person with feared health complaint in whom no diagnosis is made: Secondary | ICD-10-CM

## 2021-06-30 DIAGNOSIS — M7989 Other specified soft tissue disorders: Secondary | ICD-10-CM | POA: Diagnosis not present

## 2021-06-30 DIAGNOSIS — R0602 Shortness of breath: Secondary | ICD-10-CM | POA: Insufficient documentation

## 2021-06-30 DIAGNOSIS — R600 Localized edema: Secondary | ICD-10-CM

## 2021-06-30 DIAGNOSIS — Z3A36 36 weeks gestation of pregnancy: Secondary | ICD-10-CM | POA: Insufficient documentation

## 2021-06-30 DIAGNOSIS — R102 Pelvic and perineal pain: Secondary | ICD-10-CM | POA: Insufficient documentation

## 2021-06-30 DIAGNOSIS — R519 Headache, unspecified: Secondary | ICD-10-CM | POA: Insufficient documentation

## 2021-06-30 DIAGNOSIS — O26893 Other specified pregnancy related conditions, third trimester: Secondary | ICD-10-CM | POA: Diagnosis not present

## 2021-06-30 LAB — COMPREHENSIVE METABOLIC PANEL
ALT: 18 U/L (ref 0–44)
AST: 23 U/L (ref 15–41)
Albumin: 2.7 g/dL — ABNORMAL LOW (ref 3.5–5.0)
Alkaline Phosphatase: 128 U/L — ABNORMAL HIGH (ref 38–126)
Anion gap: 7 (ref 5–15)
BUN: 5 mg/dL — ABNORMAL LOW (ref 6–20)
CO2: 23 mmol/L (ref 22–32)
Calcium: 8.8 mg/dL — ABNORMAL LOW (ref 8.9–10.3)
Chloride: 105 mmol/L (ref 98–111)
Creatinine, Ser: 0.67 mg/dL (ref 0.44–1.00)
GFR, Estimated: >60 mL/min (ref >60)
Glucose, Bld: 82 mg/dL (ref 70–99)
Potassium: 4.1 mmol/L (ref 3.5–5.1)
Sodium: 135 mmol/L (ref 135–145)
Total Bilirubin: 0.3 mg/dL (ref 0.3–1.2)
Total Protein: 5.9 g/dL — ABNORMAL LOW (ref 6.5–8.1)

## 2021-06-30 LAB — URINALYSIS, ROUTINE W REFLEX MICROSCOPIC
Bilirubin Urine: NEGATIVE
Glucose, UA: NEGATIVE mg/dL
Hgb urine dipstick: NEGATIVE
Ketones, ur: NEGATIVE mg/dL
Leukocytes,Ua: NEGATIVE
Nitrite: NEGATIVE
Protein, ur: NEGATIVE mg/dL
Specific Gravity, Urine: 1.011 (ref 1.005–1.030)
pH: 7 (ref 5.0–8.0)

## 2021-06-30 LAB — PROTEIN / CREATININE RATIO, URINE
Creatinine, Urine: 127.13 mg/dL
Total Protein, Urine: <6 mg/dL

## 2021-06-30 LAB — CBC
HCT: 30.9 % — ABNORMAL LOW (ref 36.0–46.0)
Hemoglobin: 9.8 g/dL — ABNORMAL LOW (ref 12.0–15.0)
MCH: 27.5 pg (ref 26.0–34.0)
MCHC: 31.7 g/dL (ref 30.0–36.0)
MCV: 86.8 fL (ref 80.0–100.0)
Platelets: 262 10*3/uL (ref 150–400)
RBC: 3.56 MIL/uL — ABNORMAL LOW (ref 3.87–5.11)
RDW: 13.8 % (ref 11.5–15.5)
WBC: 6 10*3/uL (ref 4.0–10.5)
nRBC: 0 % (ref 0.0–0.2)

## 2021-06-30 LAB — CULTURE, BETA STREP (GROUP B ONLY): Strep Gp B Culture: NEGATIVE

## 2021-06-30 MED ORDER — ACETAMINOPHEN 500 MG PO TABS
1000.0000 mg | ORAL_TABLET | Freq: Once | ORAL | Status: AC
  Administered 2021-06-30: 1000 mg via ORAL
  Filled 2021-06-30: qty 2

## 2021-06-30 NOTE — MAU Note (Signed)
Pt reports mau with c/o pelvic pressure that is on going for the past week.  Pt also reports swelling of legs and feet for the past week.  Pt states her bp yesterday was 202/89, states she has not taken it today.  Bp during triage is 131/70. Pt also reports SOB for the past 4 days.   Denies LOF or reg ctx.  +FM

## 2021-06-30 NOTE — MAU Provider Note (Signed)
History     CSN: [id number]  Arrival date and time: 06/30/21 1118   Event Date/Time   First Provider Initiated Contact with Patient 06/30/21 1228      Chief Complaint  Patient presents with   Pelvic Pain   Leg Swelling   Shortness of Breath   HPI Carla [name] is a 30 y.o. [id number] at 110w3d who presents with lower extremity swelling. She reports this has been ongoing but got worse this weekend and has not improved. She reports it hurts in her feet because of the swelling. She reports her blood pressure is high for her. She reports a mild headache that she rates a 3/10 and states she has a history of headaches since she was 12. She denies any visual changes or epigastric pain. She also reports shortness of breath when she lays flat on her back.   OB History     Gravida  5   Para  2   Term  2   Preterm      AB  2   Living  2      SAB  1   IAB  1   Ectopic      Multiple      Live Births  2           Past Medical History:  Diagnosis Date   ADD (attention deficit disorder)    Anemia    Asthma    Bronchitis    Eczema    Heart palpitations    Hyperthyroidism    Insomnia    Migraine    Recurrent upper respiratory infection (URI)    Urticaria     Past Surgical History:  Procedure Laterality Date   ADENOIDECTOMY     NASAL SEPTOPLASTY W/ TURBINOPLASTY  2017   SINOSCOPY     TONSILLECTOMY     VAGINA RECONSTRUCTION SURGERY N/A     Family History  Problem Relation Age of Onset   Hypertension Mother    Hypertension Father    Diabetes Father    Diabetes Brother    Diabetes Maternal Grandmother    Stroke Maternal Grandmother    Diabetes Paternal Grandfather    Heart disease Neg Hx     Social History   Tobacco Use   Smoking status: Former    Packs/day: 0.25    Years: 4.00    Pack years: 1.00    Types: Cigarettes    Quit date: 08/18/2018    Years since quitting: 2.8   Smokeless tobacco: Never   Tobacco comments:    patient quit 2  weeks ago. 09-01-18  Vaping Use   Vaping Use: Never used  Substance Use Topics   Alcohol use: Never   Drug use: Never    Allergies:  Allergies  Allergen Reactions   Latex Hives    Medications Prior to Admission  Medication Sig Dispense Refill Last Dose   albuterol (PROVENTIL HFA;VENTOLIN HFA) 108 (90 Base) MCG/ACT inhaler Inhale 2 puffs into the lungs every 6 (six) hours as needed for wheezing or shortness of breath.      Ascorbic Acid (VITAMIN C) 1000 MG tablet Take 1,000 mg by mouth daily.      budesonide-formoterol (SYMBICORT) 160-4.5 MCG/ACT inhaler Inhale 2 puffs into the lungs 2 (two) times daily.      Butalbital-APAP-Caffeine 50-325-40 MG capsule Take 1-2 capsules by mouth every 6 (six) hours as needed for headache. 30 capsule 3    cholecalciferol (VITAMIN D3) 25 MCG (1000 UNIT)  tablet Take 1,000 Units by mouth daily.      cyclobenzaprine (FLEXERIL) 10 MG tablet Take 1 tablet (10 mg total) by mouth 3 (three) times daily as needed for muscle spasms. 40 tablet 2    ferrous sulfate (FERROUSUL) 325 (65 FE) MG tablet Take 1 tablet (325 mg total) by mouth every other day. 60 tablet 1    NIFEdipine (PROCARDIA) 10 MG capsule Take 1 capsule (10 mg total) by mouth in the morning and at bedtime. 90 capsule 3    ondansetron (ZOFRAN ODT) 4 MG disintegrating tablet Take 1 tablet (4 mg total) by mouth every 8 (eight) hours as needed for nausea or vomiting. 20 tablet 0    Prenatal Vit-Fe Fumarate-FA (PRENATAL MULTIVITAMIN) TABS tablet Take 1 tablet by mouth daily at 12 noon.      promethazine (PHENERGAN) 25 MG suppository Place 1 suppository (25 mg total) rectally every 6 (six) hours as needed for nausea or vomiting. 12 each 0    promethazine (PHENERGAN) 25 MG tablet Take 1 tablet (25 mg total) by mouth every 6 (six) hours as needed for nausea or vomiting. 30 tablet 0    scopolamine (TRANSDERM-SCOP, 1.5 MG,) 1 MG/3DAYS Place 1 patch (1.5 mg total) onto the skin every 3 (three) days. 10 patch 12      Review of Systems  Constitutional: Negative.  Negative for fatigue and fever.  HENT: Negative.    Respiratory:  Positive for shortness of breath.   Cardiovascular: Negative.  Negative for chest pain.  Gastrointestinal: Negative.  Negative for abdominal pain, constipation, diarrhea, nausea and vomiting.  Genitourinary: Negative.  Negative for dysuria, vaginal bleeding and vaginal discharge.  Neurological:  Positive for headaches. Negative for dizziness.  Physical Exam   Blood pressure 131/70, pulse 97, temperature 98.2 F (36.8 C), temperature source Oral, resp. rate 20, SpO2 99 %, unknown if currently breastfeeding.  Patient Vitals for the past 24 hrs:  BP Temp Temp src Pulse Resp SpO2  06/30/21 1446 132/89 -- -- 87 -- 100 %  06/30/21 1400 (!) 125/100 -- -- 98 -- 100 %  06/30/21 1345 127/82 -- -- 80 -- 100 %  06/30/21 1330 122/70 -- -- 76 -- 100 %  06/30/21 1315 124/75 -- -- 82 -- 100 %  06/30/21 1300 126/81 -- -- 83 -- 100 %  06/30/21 1246 130/79 -- -- 87 -- --  06/30/21 1230 128/84 -- -- 90 -- 100 %  06/30/21 1215 120/76 -- -- 97 -- 99 %  06/30/21 1200 122/81 -- -- 89 -- 99 %  06/30/21 1151 131/70 98.2 F (36.8 C) Oral 97 20 99 %   Physical Exam Vitals and nursing note reviewed.  Constitutional:      General: She is not in acute distress.    Appearance: She is well-developed.  HENT:     Head: Normocephalic.  Eyes:     Pupils: Pupils are equal, round, and reactive to light.  Cardiovascular:     Rate and Rhythm: Normal rate and regular rhythm.     Heart sounds: Normal heart sounds.  Pulmonary:     Effort: Pulmonary effort is normal. No respiratory distress.     Breath sounds: Normal breath sounds.  Abdominal:     General: Bowel sounds are normal. There is no distension.     Palpations: Abdomen is soft.     Tenderness: There is no abdominal tenderness.  Skin:    General: Skin is warm and dry.  Neurological:  Mental Status: She is alert and oriented to person,  place, and time.  Psychiatric:        Mood and Affect: Mood normal.        Behavior: Behavior normal.        Thought Content: Thought content normal.        Judgment: Judgment normal.   Fetal Tracing:  Baseline:  135 Variability: moderate Accels: 15x15 Decels: none  Toco: none   2/50/-3   MAU Course  Procedures Results for orders placed or performed during the hospital encounter of 06/30/21 (from the past 24 hour(s))  Urinalysis, Routine w reflex microscopic Urine, Clean Catch     Status: None   Collection Time: 06/30/21 11:40 AM  Result Value Ref Range   Color, Urine YELLOW YELLOW   APPearance CLEAR CLEAR   Specific Gravity, Urine 1.011 1.005 - 1.030   pH 7.0 5.0 - 8.0   Glucose, UA NEGATIVE NEGATIVE mg/dL   Hgb urine dipstick NEGATIVE NEGATIVE   Bilirubin Urine NEGATIVE NEGATIVE   Ketones, ur NEGATIVE NEGATIVE mg/dL   Protein, ur NEGATIVE NEGATIVE mg/dL   Nitrite NEGATIVE NEGATIVE   Leukocytes,Ua NEGATIVE NEGATIVE  Protein / creatinine ratio, urine     Status: None   Collection Time: 06/30/21 11:40 AM  Result Value Ref Range   Creatinine, Urine 127.13 mg/dL   Total Protein, Urine <6.0 mg/dL   Protein Creatinine Ratio        0.00 - 0.15 mg/mg[Cre]  CBC     Status: Abnormal   Collection Time: 06/30/21 12:48 PM  Result Value Ref Range   WBC 6.0 4.0 - 10.5 K/uL   RBC 3.56 (L) 3.87 - 5.11 MIL/uL   Hemoglobin 9.8 (L) 12.0 - 15.0 g/dL   HCT 08.6 (L) 57.8 - 46.9 %   MCV 86.8 80.0 - 100.0 fL   MCH 27.5 26.0 - 34.0 pg   MCHC 31.7 30.0 - 36.0 g/dL   RDW 62.9 52.8 - 41.3 %   Platelets 262 150 - 400 K/uL   nRBC 0.0 0.0 - 0.2 %  Comprehensive metabolic panel     Status: Abnormal   Collection Time: 06/30/21 12:48 PM  Result Value Ref Range   Sodium 135 135 - 145 mmol/L   Potassium 4.1 3.5 - 5.1 mmol/L   Chloride 105 98 - 111 mmol/L   CO2 23 22 - 32 mmol/L   Glucose, Bld 82 70 - 99 mg/dL   BUN 5 (L) 6 - 20 mg/dL   Creatinine, Ser 2.44 0.44 - 1.00 mg/dL   Calcium 8.8  (L) 8.9 - 10.3 mg/dL   Total Protein 5.9 (L) 6.5 - 8.1 g/dL   Albumin 2.7 (L) 3.5 - 5.0 g/dL   AST 23 15 - 41 U/L   ALT 18 0 - 44 U/L   Alkaline Phosphatase 128 (H) 38 - 126 U/L   Total Bilirubin 0.3 0.3 - 1.2 mg/dL   GFR, Estimated >01 >02 mL/min   Anion gap 7 5 - 15    MDM UA Preeclampsia labs per patient request Tylenol PO Patient requested cervical exam- unchanged from office  One elevated BP while in MAU and patient was sitting up eating with arm bent. Repeat normal  Assessment and Plan   1. Physically well but worried   2. Lower extremity edema   3. [redacted] weeks gestation of pregnancy    -Discharge home in stable condition -Preeclampsia precautions discussed -Patient advised to follow-up with OB as scheduled for prenatal care -  Patient may return to MAU as needed or if her condition were to change or worsen   Rolm BookbinderCaroline M Raybon Conard CNM 06/30/2021, 12:28 PM

## 2021-06-30 NOTE — Discharge Instructions (Signed)

## 2021-07-01 ENCOUNTER — Other Ambulatory Visit: Payer: Self-pay | Admitting: Obstetrics

## 2021-07-01 ENCOUNTER — Ambulatory Visit: Payer: Medicaid Other | Admitting: *Deleted

## 2021-07-01 ENCOUNTER — Ambulatory Visit: Payer: Medicaid Other | Attending: Obstetrics

## 2021-07-01 ENCOUNTER — Encounter: Payer: Self-pay | Admitting: *Deleted

## 2021-07-01 VITALS — BP 121/78 | HR 89

## 2021-07-01 DIAGNOSIS — O365930 Maternal care for other known or suspected poor fetal growth, third trimester, not applicable or unspecified: Secondary | ICD-10-CM

## 2021-07-01 DIAGNOSIS — O0993 Supervision of high risk pregnancy, unspecified, third trimester: Secondary | ICD-10-CM | POA: Diagnosis present

## 2021-07-01 DIAGNOSIS — O99280 Endocrine, nutritional and metabolic diseases complicating pregnancy, unspecified trimester: Secondary | ICD-10-CM | POA: Insufficient documentation

## 2021-07-01 DIAGNOSIS — O365931 Maternal care for other known or suspected poor fetal growth, third trimester, fetus 1: Secondary | ICD-10-CM

## 2021-07-01 DIAGNOSIS — O99019 Anemia complicating pregnancy, unspecified trimester: Secondary | ICD-10-CM | POA: Diagnosis present

## 2021-07-01 DIAGNOSIS — E0590 Thyrotoxicosis, unspecified without thyrotoxic crisis or storm: Secondary | ICD-10-CM | POA: Insufficient documentation

## 2021-07-01 NOTE — Procedures (Signed)
Carla [name] 1991-08-06 [redacted]w[redacted]d  Fetus A Non-Stress Test Interpretation for 07/01/21  Indication: IUGR  Fetal Heart Rate A Mode: External Baseline Rate (A): 135 bpm Variability: Moderate Accelerations: 15 x 15 Decelerations: None Multiple birth?: No  Uterine Activity Mode: Palpation, Toco Contraction Frequency (min): Occas w/UI Contraction Quality: Mild Resting Tone Palpated: Relaxed Resting Time: Adequate  Interpretation (Fetal Testing) Nonstress Test Interpretation: Reactive Comments: Dr. Judeth Cornfield reviewed tracing.

## 2021-07-02 ENCOUNTER — Telehealth: Payer: Self-pay

## 2021-07-02 DIAGNOSIS — O365990 Maternal care for other known or suspected poor fetal growth, unspecified trimester, not applicable or unspecified: Secondary | ICD-10-CM | POA: Insufficient documentation

## 2021-07-02 NOTE — Progress Notes (Signed)
   PRENATAL VISIT NOTE  Subjective:  Carla [name] is a 30 y.o. [phone] at [redacted]w[redacted]d being seen today for ongoing prenatal care.  She is currently monitored for the following issues for this high-risk pregnancy and has Cervical radiculopathy; Slow transit constipation; Supervision of high-risk pregnancy, third trimester; Hyperthyroidism affecting pregnancy, antepartum; Anemia, antepartum; Abnormal Pap smear of cervix; History of depression; Urticaria; Recurrent upper respiratory infection (URI); Migraine; Insomnia; Hyperthyroidism; Eczema; Bronchitis; Asthma; ADD (attention deficit disorder); Old vaginal laceration; Lightheadedness; Atypical chest pain; Orthostatic hypotension; Pregnancy related leg pain, antepartum, third trimester; Anemia; Elevated blood pressure reading; Palpitations; and Intrauterine growth restriction affecting antepartum care of mother on their problem list.  Patient reports no complaints.  Contractions: Irritability. Vag. Bleeding: None.  Movement: Present. Denies leaking of fluid.   The following portions of the patient's history were reviewed and updated as appropriate: allergies, current medications, past family history, past medical history, past social history, past surgical history and problem list.   Objective:   Vitals:   06/26/21 1536  BP: 124/82  Pulse: 98  Weight: 174 lb (78.9 kg)    Fetal Status: Fetal Heart Rate (bpm): 147   Movement: Present     General:  Alert, oriented and cooperative. Patient is in no acute distress.  Skin: Skin is warm and dry. No rash noted.   Cardiovascular: Normal heart rate noted  Respiratory: Normal respiratory effort, no problems with respiration noted  Abdomen: Soft, gravid, appropriate for gestational age.  Pain/Pressure: Present     Pelvic: Cervical exam performed in the presence of a chaperone        Extremities: Normal range of motion.  Edema: Trace  Mental Status: Normal mood and affect. Normal behavior. Normal  judgment and thought content.   Assessment and Plan:  Pregnancy: [id number] at [redacted]w[redacted]d 1. Supervision of high-risk pregnancy, third trimester - Culture, beta strep (group b only) - GC/Chlamydia probe amp ()not at [facility]  2. Hyperthyroidism affecting pregnancy, antepartum stable  3. Anemia, antepartum  4. Intrauterine growth restriction affecting antepartum care of mother, single or unspecified fetus For delivery at 37 weeks per MFM note  5. H/o perineal injury prev preg D/w pt. She wants to attempt a vaginal delivery.  She is aware of the risks.      Preterm labor symptoms and general obstetric precautions including but not limited to vaginal bleeding, contractions, leaking of fluid and fetal movement were reviewed in detail with the patient. Please refer to After Visit Summary for other counseling recommendations.   No follow-ups on file.  Future Appointments  Date Time Provider Department Center  07/04/2021  8:15 AM Levie Heritage, DO [facility] None  07/05/2021  7:05 AM [facility]-LD SCHED ROOM [facility] None  07/09/2021  1:30 PM [facility] NURSE [facility]  07/09/2021  1:45 PM [facility] US4 [facility]  07/09/2021  3:30 PM Levert Feinstein, MD [facility] None  07/11/2021  8:15 AM Levie Heritage, DO [facility] None  07/11/2021  3:00 PM Tobb, Lavona Mound, DO [facility]PT None  07/18/2021  8:15 AM Levie Heritage, DO [facility] None  07/24/2021  8:15 AM Levie Heritage, DO [facility] None  08/30/2021 10:10 AM Shamleffer, Konrad Dolores, MD [facility] None    Willodean Rosenthal, MD

## 2021-07-02 NOTE — Telephone Encounter (Signed)
Pt called stating she has not felt baby move since yesterday. I advised pt to go to [facility] to be seen. Understanding was voiced. Carla [name] l Aram Domzalski, CMA

## 2021-07-03 ENCOUNTER — Telehealth (HOSPITAL_COMMUNITY): Payer: Self-pay | Admitting: *Deleted

## 2021-07-03 ENCOUNTER — Other Ambulatory Visit: Payer: Self-pay | Admitting: Obstetrics & Gynecology

## 2021-07-03 ENCOUNTER — Encounter (HOSPITAL_COMMUNITY): Payer: Self-pay

## 2021-07-03 ENCOUNTER — Encounter (HOSPITAL_COMMUNITY): Payer: Self-pay | Admitting: *Deleted

## 2021-07-03 LAB — SARS CORONAVIRUS 2 (TAT 6-24 HRS): SARS Coronavirus 2: NEGATIVE

## 2021-07-03 NOTE — Telephone Encounter (Signed)
Preadmission screen  

## 2021-07-04 ENCOUNTER — Inpatient Hospital Stay (HOSPITAL_COMMUNITY)
Admission: AD | Admit: 2021-07-04 | Discharge: 2021-07-06 | DRG: 807 | Disposition: A | Discharge status: Home / Self Care | Payer: Medicaid Other | Attending: Obstetrics & Gynecology | Admitting: Obstetrics & Gynecology

## 2021-07-04 ENCOUNTER — Other Ambulatory Visit: Payer: Self-pay

## 2021-07-04 ENCOUNTER — Ambulatory Visit (INDEPENDENT_AMBULATORY_CARE_PROVIDER_SITE_OTHER): Payer: Medicaid Other | Admitting: Family Medicine

## 2021-07-04 ENCOUNTER — Encounter (HOSPITAL_COMMUNITY): Payer: Self-pay | Admitting: Obstetrics & Gynecology

## 2021-07-04 VITALS — BP 133/83 | HR 85 | Wt 177.0 lb

## 2021-07-04 DIAGNOSIS — O9952 Diseases of the respiratory system complicating childbirth: Secondary | ICD-10-CM | POA: Diagnosis present

## 2021-07-04 DIAGNOSIS — Z3A37 37 weeks gestation of pregnancy: Secondary | ICD-10-CM

## 2021-07-04 DIAGNOSIS — O368130 Decreased fetal movements, third trimester, not applicable or unspecified: Principal | ICD-10-CM | POA: Diagnosis present

## 2021-07-04 DIAGNOSIS — O0993 Supervision of high risk pregnancy, unspecified, third trimester: Secondary | ICD-10-CM

## 2021-07-04 DIAGNOSIS — O365930 Maternal care for other known or suspected poor fetal growth, third trimester, not applicable or unspecified: Secondary | ICD-10-CM | POA: Diagnosis present

## 2021-07-04 DIAGNOSIS — Z87891 Personal history of nicotine dependence: Secondary | ICD-10-CM | POA: Diagnosis not present

## 2021-07-04 DIAGNOSIS — Z880 Allergy status to penicillin: Secondary | ICD-10-CM

## 2021-07-04 DIAGNOSIS — J45909 Unspecified asthma, uncomplicated: Secondary | ICD-10-CM | POA: Diagnosis present

## 2021-07-04 DIAGNOSIS — E0590 Thyrotoxicosis, unspecified without thyrotoxic crisis or storm: Secondary | ICD-10-CM

## 2021-07-04 DIAGNOSIS — Z91040 Latex allergy status: Secondary | ICD-10-CM

## 2021-07-04 DIAGNOSIS — N898 Other specified noninflammatory disorders of vagina: Secondary | ICD-10-CM

## 2021-07-04 DIAGNOSIS — O368190 Decreased fetal movements, unspecified trimester, not applicable or unspecified: Secondary | ICD-10-CM

## 2021-07-04 DIAGNOSIS — R030 Elevated blood-pressure reading, without diagnosis of hypertension: Secondary | ICD-10-CM

## 2021-07-04 DIAGNOSIS — O99280 Endocrine, nutritional and metabolic diseases complicating pregnancy, unspecified trimester: Secondary | ICD-10-CM

## 2021-07-04 LAB — COMPREHENSIVE METABOLIC PANEL
ALT: 17 U/L (ref 0–44)
AST: 22 U/L (ref 15–41)
Albumin: 2.7 g/dL — ABNORMAL LOW (ref 3.5–5.0)
Alkaline Phosphatase: 129 U/L — ABNORMAL HIGH (ref 38–126)
Anion gap: 7 (ref 5–15)
BUN: <5 mg/dL — ABNORMAL LOW (ref 6–20)
CO2: 22 mmol/L (ref 22–32)
Calcium: 8.4 mg/dL — ABNORMAL LOW (ref 8.9–10.3)
Chloride: 105 mmol/L (ref 98–111)
Creatinine, Ser: 0.67 mg/dL (ref 0.44–1.00)
GFR, Estimated: >60 mL/min (ref >60)
Glucose, Bld: 77 mg/dL (ref 70–99)
Potassium: 3.6 mmol/L (ref 3.5–5.1)
Sodium: 134 mmol/L — ABNORMAL LOW (ref 135–145)
Total Bilirubin: 0.2 mg/dL — ABNORMAL LOW (ref 0.3–1.2)
Total Protein: 6 g/dL — ABNORMAL LOW (ref 6.5–8.1)

## 2021-07-04 LAB — CBC
HCT: 31.9 % — ABNORMAL LOW (ref 36.0–46.0)
Hemoglobin: 10 g/dL — ABNORMAL LOW (ref 12.0–15.0)
MCH: 27 pg (ref 26.0–34.0)
MCHC: 31.3 g/dL (ref 30.0–36.0)
MCV: 86.2 fL (ref 80.0–100.0)
Platelets: 272 10*3/uL (ref 150–400)
RBC: 3.7 MIL/uL — ABNORMAL LOW (ref 3.87–5.11)
RDW: 13.8 % (ref 11.5–15.5)
WBC: 7.5 10*3/uL (ref 4.0–10.5)
nRBC: 0 % (ref 0.0–0.2)

## 2021-07-04 LAB — TYPE AND SCREEN
ABO/RH(D): O POS
Antibody Screen: NEGATIVE

## 2021-07-04 LAB — PROTEIN / CREATININE RATIO, URINE
Creatinine, Urine: 25.87 mg/dL
Total Protein, Urine: <6 mg/dL

## 2021-07-04 LAB — RPR: RPR Ser Ql: NONREACTIVE

## 2021-07-04 MED ORDER — TERBUTALINE SULFATE 1 MG/ML IJ SOLN: 0.2500 mg | Freq: Once | INTRAMUSCULAR | Status: DC | PRN

## 2021-07-04 MED ORDER — PHENYLEPHRINE 40 MCG/ML (10ML) SYRINGE FOR IV PUSH (FOR BLOOD PRESSURE SUPPORT): 80.0000 ug | PREFILLED_SYRINGE | INTRAVENOUS | Status: DC | PRN

## 2021-07-04 MED ORDER — DIPHENHYDRAMINE HCL 50 MG/ML IJ SOLN
12.5000 mg | INTRAMUSCULAR | Status: DC | PRN
Start: 2021-07-04 — End: 2021-07-04

## 2021-07-04 MED ORDER — SOD CITRATE-CITRIC ACID 500-334 MG/5ML PO SOLN: 30.0000 mL | ORAL | Status: DC | PRN

## 2021-07-04 MED ORDER — BENZOCAINE-MENTHOL 20-0.5 % EX AERO
1.0000 | INHALATION_SPRAY | CUTANEOUS | Status: DC | PRN
Start: 2021-07-04 — End: 2021-07-06
  Administered 2021-07-04 – 2021-07-05 (×2): 1 via TOPICAL
  Filled 2021-07-04 (×2): qty 56

## 2021-07-04 MED ORDER — OXYCODONE-ACETAMINOPHEN 5-325 MG PO TABS: 1.0000 | ORAL_TABLET | ORAL | Status: DC | PRN

## 2021-07-04 MED ORDER — LACTATED RINGERS IV SOLN: 500.0000 mL | Freq: Once | INTRAVENOUS | Status: DC

## 2021-07-04 MED ORDER — SIMETHICONE 80 MG PO CHEW: 80.0000 mg | CHEWABLE_TABLET | ORAL | Status: DC | PRN

## 2021-07-04 MED ORDER — LACTATED RINGERS IV SOLN: 500.0000 mL | INTRAVENOUS | Status: DC | PRN

## 2021-07-04 MED ORDER — PRENATAL MULTIVITAMIN CH
1.0000 | ORAL_TABLET | Freq: Every day | ORAL | Status: DC
Start: 2021-07-05 — End: 2021-07-06
  Administered 2021-07-05 – 2021-07-06 (×2): 1 via ORAL
  Filled 2021-07-04 (×2): qty 1

## 2021-07-04 MED ORDER — EPHEDRINE 5 MG/ML INJ: 10.0000 mg | INTRAVENOUS | Status: DC | PRN

## 2021-07-04 MED ORDER — OXYTOCIN-SODIUM CHLORIDE 30-0.9 UT/500ML-% IV SOLN
2.5000 [IU]/h | INTRAVENOUS | Status: DC
  Administered 2021-07-04: 2.5 [IU]/h via INTRAVENOUS
  Filled 2021-07-04: qty 500

## 2021-07-04 MED ORDER — OXYTOCIN-SODIUM CHLORIDE 30-0.9 UT/500ML-% IV SOLN
1.0000 m[IU]/min | INTRAVENOUS | Status: DC
  Administered 2021-07-04: 2 m[IU]/min via INTRAVENOUS

## 2021-07-04 MED ORDER — COCONUT OIL OIL: 1.0000 "application " | TOPICAL_OIL | Status: DC | PRN

## 2021-07-04 MED ORDER — FENTANYL-BUPIVACAINE-NACL 0.5-0.125-0.9 MG/250ML-% EP SOLN: 12.0000 mL/h | EPIDURAL | Status: DC | PRN

## 2021-07-04 MED ORDER — ACETAMINOPHEN 325 MG PO TABS
650.0000 mg | ORAL_TABLET | ORAL | Status: DC | PRN
  Administered 2021-07-05 (×3): 650 mg via ORAL
  Filled 2021-07-04 (×3): qty 2

## 2021-07-04 MED ORDER — DIPHENHYDRAMINE HCL 50 MG/ML IJ SOLN: 12.5000 mg | INTRAMUSCULAR | Status: DC | PRN

## 2021-07-04 MED ORDER — EPHEDRINE 5 MG/ML INJ
10.0000 mg | INTRAVENOUS | Status: DC | PRN
Start: 2021-07-04 — End: 2021-07-04

## 2021-07-04 MED ORDER — ACETAMINOPHEN 325 MG PO TABS: 650.0000 mg | ORAL_TABLET | ORAL | Status: DC | PRN

## 2021-07-04 MED ORDER — ONDANSETRON HCL 4 MG PO TABS: 4.0000 mg | ORAL_TABLET | ORAL | Status: DC | PRN

## 2021-07-04 MED ORDER — ZOLPIDEM TARTRATE 5 MG PO TABS: 5.0000 mg | ORAL_TABLET | Freq: Every evening | ORAL | Status: DC | PRN

## 2021-07-04 MED ORDER — SENNOSIDES-DOCUSATE SODIUM 8.6-50 MG PO TABS
2.0000 | ORAL_TABLET | Freq: Every day | ORAL | Status: DC
  Administered 2021-07-05 – 2021-07-06 (×2): 2 via ORAL
  Filled 2021-07-04 (×2): qty 2

## 2021-07-04 MED ORDER — LACTATED RINGERS IV SOLN: INTRAVENOUS | Status: DC

## 2021-07-04 MED ORDER — LIDOCAINE HCL (PF) 1 % IJ SOLN: 30.0000 mL | INTRAMUSCULAR | Status: DC | PRN

## 2021-07-04 MED ORDER — WITCH HAZEL-GLYCERIN EX PADS: 1.0000 "application " | MEDICATED_PAD | CUTANEOUS | Status: DC | PRN

## 2021-07-04 MED ORDER — OXYTOCIN BOLUS FROM INFUSION
333.0000 mL | Freq: Once | INTRAVENOUS | Status: AC
Start: 2021-07-04 — End: 2021-07-04
  Administered 2021-07-04: 333 mL via INTRAVENOUS

## 2021-07-04 MED ORDER — ONDANSETRON HCL 4 MG/2ML IJ SOLN: 4.0000 mg | Freq: Four times a day (QID) | INTRAMUSCULAR | Status: DC | PRN

## 2021-07-04 MED ORDER — DIPHENHYDRAMINE HCL 25 MG PO CAPS: 25.0000 mg | ORAL_CAPSULE | Freq: Four times a day (QID) | ORAL | Status: DC | PRN

## 2021-07-04 MED ORDER — IBUPROFEN 600 MG PO TABS
600.0000 mg | ORAL_TABLET | Freq: Four times a day (QID) | ORAL | Status: DC
  Administered 2021-07-04 – 2021-07-06 (×8): 600 mg via ORAL
  Filled 2021-07-04 (×8): qty 1

## 2021-07-04 MED ORDER — TETANUS-DIPHTH-ACELL PERTUSSIS 5-2.5-18.5 LF-MCG/0.5 IM SUSY: 0.5000 mL | PREFILLED_SYRINGE | Freq: Once | INTRAMUSCULAR | Status: DC

## 2021-07-04 MED ORDER — DIBUCAINE (PERIANAL) 1 % EX OINT: 1.0000 "application " | TOPICAL_OINTMENT | CUTANEOUS | Status: DC | PRN

## 2021-07-04 MED ORDER — OXYCODONE-ACETAMINOPHEN 5-325 MG PO TABS: 2.0000 | ORAL_TABLET | ORAL | Status: DC | PRN

## 2021-07-04 MED ORDER — ONDANSETRON HCL 4 MG/2ML IJ SOLN
4.0000 mg | INTRAMUSCULAR | Status: DC | PRN
Start: 2021-07-04 — End: 2021-07-06

## 2021-07-04 NOTE — Progress Notes (Signed)
Carla [name] is a 30 y.o. [id number] at [redacted]w[redacted]d by ultrasound admitted for induction of labor due to DFM, FGR.  Subjective: Pt feeling mild irregular cramping, feeling fetal movement but decreased. Pt s/o at bedside for support.  Objective: BP 113/67   Pulse 72   Resp 16   Ht 5\' 9"  (1.753 m)   Wt 81.2 kg   LMP  (LMP Unknown) Comment: reports irreg cycles, ? LMP sometime in Oct, spotted ~11/3  BMI 26.43 kg/m  No intake/output data recorded. No intake/output data recorded.  FHT:  FHR: 135 bpm, variability: moderate,  accelerations:  Present,  decelerations:  Present recurrent lates x 3 resolved with position change UC:   regular, every 3-4 minutes SVE:   Dilation: 4 Effacement (%): 60 Station: -1 Exam by:: leftwich kirby,cnm  Labs: Lab Results  Component Value Date   WBC 7.5 07/04/2021   HGB 10.0 (L) 07/04/2021   HCT 31.9 (L) 07/04/2021   MCV 86.2 07/04/2021   PLT 272 07/04/2021    Assessment / Plan: Induction of labor due to FGR, DFM,  progressing well on pitocin  Labor: Progressing normally Preeclampsia:   Isolated HTN on admission, PEC labs wnl Fetal Wellbeing:  Category II Pain Control:  Labor support without medications I/D:   GBS neg Anticipated MOD:  NSVD  09/03/2021 07/04/2021, 3:47 PM

## 2021-07-04 NOTE — Progress Notes (Signed)
   PRENATAL VISIT NOTE  Subjective:  Carla [name] is a 30 y.o. [phone] at [redacted]w[redacted]d being seen today for ongoing prenatal care.  She is currently monitored for the following issues for this high-risk pregnancy and has Cervical radiculopathy; Slow transit constipation; Supervision of high-risk pregnancy, third trimester; Hyperthyroidism affecting pregnancy, antepartum; Anemia, antepartum; Abnormal Pap smear of cervix; History of depression; Urticaria; Recurrent upper respiratory infection (URI); Migraine; Insomnia; Hyperthyroidism; Eczema; Bronchitis; Asthma; ADD (attention deficit disorder); Old vaginal laceration; Lightheadedness; Atypical chest pain; Orthostatic hypotension; Pregnancy related leg pain, antepartum, third trimester; Anemia; Elevated blood pressure reading; Palpitations; and Intrauterine growth restriction affecting antepartum care of mother on their problem list.  Patient reports  continues to have decreased fetal movement .  Contractions: Irregular. Vag. Bleeding: None.  Movement: Present. Denies leaking of fluid.   The following portions of the patient's history were reviewed and updated as appropriate: allergies, current medications, past family history, past medical history, past social history, past surgical history and problem list.   Objective:   Vitals:   07/04/21 0819 07/04/21 0824  BP: (!) 141/90 133/83  Pulse: 81 85  Weight: 177 lb (80.3 kg)     Fetal Status: Fetal Heart Rate (bpm): 136   Movement: Present     General:  Alert, oriented and cooperative. Patient is in no acute distress.  Skin: Skin is warm and dry. No rash noted.   Cardiovascular: Normal heart rate noted  Respiratory: Normal respiratory effort, no problems with respiration noted  Abdomen: Soft, gravid, appropriate for gestational age.  Pain/Pressure: Present     Pelvic: Cervical exam deferred        Extremities: Normal range of motion.  Edema: Trace  Mental Status: Normal mood and affect.  Normal behavior. Normal judgment and thought content.   Assessment and Plan:  Pregnancy: [id number] at [redacted]w[redacted]d 1. [redacted] weeks gestation of pregnancy  2. Supervision of high-risk pregnancy, third trimester FHT and FH normal.  3. Hyperthyroidism affecting pregnancy, antepartum Sees endocrine. Stable  4. Elevated blood pressure reading Elevated today  5. Decreased fetal movements in third trimester, single or unspecified fetus Chronic decreased fetal movement. Reports less over night. Will send to labor floor for induction today. MFM had recommended delivery between 37-38 weeks. Patient approximately 3cm.  6. Old vaginal laceration H/o 4th degree laceration - patient previously counseled regarding risks. Desires vaginal delivery.    Term labor symptoms and general obstetric precautions including but not limited to vaginal bleeding, contractions, leaking of fluid and fetal movement were reviewed in detail with the patient. Please refer to After Visit Summary for other counseling recommendations.   No follow-ups on file.  Future Appointments  Date Time Provider Department Center  07/05/2021  7:05 AM MC-LD SCHED ROOM [facility]DC None  07/09/2021  1:30 PM [facility] NURSE [facility]  07/09/2021  1:45 PM [facility] US4 [facility]  07/09/2021  3:30 PM Levert Feinstein, MD [facility] None  07/11/2021  3:00 PM Tobb, Lavona Mound, DO [facility]PT None  08/15/2021  1:50 PM Levie Heritage, DO [facility] None  08/30/2021 10:10 AM Shamleffer, Konrad Dolores, MD [facility] None    Levie Heritage, DO

## 2021-07-04 NOTE — Lactation Note (Signed)
This note was copied from a baby's chart. Lactation Consultation Note  Patient Name: Carla [name] [id number] Date: 07/04/2021   Age:31 hours LC for latch assist not selected on L and D board. LC went to assist with latching, mother getting up to go to the bathroom. RN stated mother latched infant already.   Maternal Data    Feeding    LATCH Score Latch: Grasps breast easily, tongue down, lips flanged, rhythmical sucking.  Audible Swallowing: Spontaneous and intermittent  Type of Nipple: Everted at rest and after stimulation  Comfort (Breast/Nipple): Soft / non-tender  Hold (Positioning): No assistance needed to correctly position infant at breast.  LATCH Score: 10   Lactation Tools Discussed/Used    Interventions    Discharge    Consult Status      Carla Battershell  [name] 07/04/2021, 6:39 PM

## 2021-07-04 NOTE — H&P (Signed)
OBSTETRIC ADMISSION HISTORY AND PHYSICAL  Carla [name] is a 30 y.o. female [phone] with IUP at [redacted]w[redacted]d by early [place] presenting for induction of labor for chronic decreased fetal movement with fetal growth restriction. She presents with borderline elevated BP today, 140s/90s, none severe range. She also reports headache but has hx chronic headaches with cervical radiculopathy.  Pregnancy complicated by hx 4th degree laceration in previous pregnancy, anemia on oral replacement, and abnormal Pap with plan for repeat postpartum.. She reports +FMs, No LOF, no VB, no blurry vision, or peripheral edema, or RUQ pain.   In addition, pregnancy complicated by hyperthyroid, stable, not on medications.   She plans on breast feeding. She request POPs for birth control. She received her prenatal care at  [facility]    Dating: By 9 week [place] --->  Estimated Date of Delivery: 07/25/21  Sono:    @[redacted]w[redacted]d , CWD, normal anatomy, vertex presentation,  9% EFW. BPP on 07/01/21 10/10 with NST with normal cord dopplers   Nursing Staff Provider  Office Location  [facility] Dating   early [facility]  Language  English  Anatomy US    Flu Vaccine   Genetic Screen  NIPS:  Low risk female AFP: neg     TDaP Vaccine  05/16/21 Hgb A1C or  GTT Early  Third trimester WNL   COVID Vaccine 10/21 & 11/21   LAB RESULTS   Rhogam   Blood Type O/Positive/-- (01/31 1115)   Feeding Plan Breast  Antibody Negative (01/31 1115)  Contraception POP Rubella 10.70 (01/31 1115)  Circumcision N/A  RPR Non Reactive (01/31 1115)   Pediatrician  [facility]  HBsAg Negative (01/31 1115)   Support Person FOB  HCVAb   Prenatal Classes  HIV Non Reactive (01/31 1115)     BTL Consent  GBS   (For PCN allergy, check sensitivities)NEG   VBAC Consent  Pap     Hgb Electro    BP Cuff  CF     SMA     Waterbirth  [ ]  Class [ ]  Consent [ ]  CNM visit    Induction  [ ]  Orders Entered [ ] Foley Y/N     Past Medical History: Past Medical History:  Diagnosis Date    ADD (attention deficit disorder)    Anemia    Asthma    Bronchitis    Eczema    Heart palpitations    Hyperthyroidism    Insomnia    Migraine    Recurrent upper respiratory infection (URI)    Urticaria     Past Surgical History: Past Surgical History:  Procedure Laterality Date   ADENOIDECTOMY     NASAL SEPTOPLASTY W/ TURBINOPLASTY  2017   SINOSCOPY     TONSILLECTOMY     VAGINA RECONSTRUCTION SURGERY N/A     Obstetrical History: OB History     Gravida  5   Para  2   Term  2   Preterm      AB  2   Living  2      SAB  1   IAB  1   Ectopic      Multiple      Live Births  2           Social History Social History   Socioeconomic History   Marital status: Single    Spouse name: Not on file   Number of children: 2   Years of education: Not on file   Highest education  level: Some college, no degree  Occupational History   Not on file  Tobacco Use   Smoking status: Former    Packs/day: 0.25    Years: 4.00    Pack years: 1.00    Types: Cigarettes    Quit date: 08/18/2018    Years since quitting: 2.8   Smokeless tobacco: Never   Tobacco comments:    patient quit 2 weeks ago. 09-01-18  Vaping Use   Vaping Use: Never used  Substance and Sexual Activity   Alcohol use: Never   Drug use: Never   Sexual activity: Yes  Other Topics Concern   Not on file  Social History Narrative   Not on file   Social Determinants of Health   Financial Resource Strain: Not on file  Food Insecurity: Not on file  Transportation Needs: Not on file  Physical Activity: Not on file  Stress: Not on file  Social Connections: Not on file    Family History: Family History  Problem Relation Age of Onset   Hypertension Mother    Hypertension Father    Diabetes Father    Diabetes Brother    Diabetes Maternal Grandmother    Stroke Maternal Grandmother    Diabetes Paternal Grandfather    Heart disease Neg Hx     Allergies: Allergies  Allergen Reactions    Latex Hives    Medications Prior to Admission  Medication Sig Dispense Refill Last Dose   albuterol (PROVENTIL HFA;VENTOLIN HFA) 108 (90 Base) MCG/ACT inhaler Inhale 2 puffs into the lungs every 6 (six) hours as needed for wheezing or shortness of breath.      Ascorbic Acid (VITAMIN C) 1000 MG tablet Take 1,000 mg by mouth daily.      budesonide-formoterol (SYMBICORT) 160-4.5 MCG/ACT inhaler Inhale 2 puffs into the lungs 2 (two) times daily.      Butalbital-APAP-Caffeine 50-325-40 MG capsule Take 1-2 capsules by mouth every 6 (six) hours as needed for headache. 30 capsule 3    cholecalciferol (VITAMIN D3) 25 MCG (1000 UNIT) tablet Take 1,000 Units by mouth daily.      cyclobenzaprine (FLEXERIL) 10 MG tablet Take 1 tablet (10 mg total) by mouth 3 (three) times daily as needed for muscle spasms. 40 tablet 2    ferrous sulfate (FERROUSUL) 325 (65 FE) MG tablet Take 1 tablet (325 mg total) by mouth every other day. 60 tablet 1    NIFEdipine (PROCARDIA) 10 MG capsule Take 1 capsule (10 mg total) by mouth in the morning and at bedtime. 90 capsule 3    ondansetron (ZOFRAN ODT) 4 MG disintegrating tablet Take 1 tablet (4 mg total) by mouth every 8 (eight) hours as needed for nausea or vomiting. (Patient not taking: No sig reported) 20 tablet 0    Prenatal Vit-Fe Fumarate-FA (PRENATAL MULTIVITAMIN) TABS tablet Take 1 tablet by mouth daily at 12 noon.      promethazine (PHENERGAN) 25 MG suppository Place 1 suppository (25 mg total) rectally every 6 (six) hours as needed for nausea or vomiting. 12 each 0    promethazine (PHENERGAN) 25 MG tablet Take 1 tablet (25 mg total) by mouth every 6 (six) hours as needed for nausea or vomiting. (Patient not taking: No sig reported) 30 tablet 0    scopolamine (TRANSDERM-SCOP, 1.5 MG,) 1 MG/3DAYS Place 1 patch (1.5 mg total) onto the skin every 3 (three) days. (Patient not taking: No sig reported) 10 patch 12      Review of Systems   All  systems reviewed and  negative except as stated in HPI  Blood pressure 138/90, pulse 73, resp. rate 16, height 5\' 9"  (1.753 m), weight 81.2 kg, unknown if currently breastfeeding. General appearance: alert, cooperative, and no distress Lungs: clear to auscultation bilaterally Heart: regular rate and rhythm Abdomen: soft, non-tender; bowel sounds normal Extremities: Homans sign is negative, no sign of DVT DTRs: wnl Presentation: cephalic Fetal monitoringBaseline: 135 bpm Uterine activityFrequency: Every 5-10 minutes, mild to palpation     Prenatal labs: ABO, Rh: --/--/O POS (08/11 1015) Antibody: NEG (08/11 1015) Rubella: 10.70 (01/31 1115) RPR: Non Reactive (05/26 0822)  HBsAg: Negative (01/31 1115)  HIV: Non Reactive (05/26 04-18-1978)  GBS: Negative/-- (08/03 1600)  2 hr Glucola wnl 75, 80, 80 Genetic screening  NIPS low risk female Anatomy 11-26-1997 wnl  Prenatal Transfer Tool  Maternal Diabetes: No Genetic Screening: Normal Maternal Ultrasounds/Referrals: IUGR Fetal Ultrasounds or other Referrals:  None Maternal Substance Abuse:  No Significant Maternal Medications:  None Significant Maternal Lab Results: Group B Strep negative  Results for orders placed or performed during the hospital encounter of 07/04/21 (from the past 24 hour(s))  Type and screen [facility]   Collection Time: 07/04/21 10:15 AM  Result Value Ref Range   ABO/RH(D) O POS    Antibody Screen NEG    Sample Expiration      07/07/2021,2359 Performed at [facility], [address], [place], [place]   CBC   Collection Time: 07/04/21 10:21 AM  Result Value Ref Range   WBC 7.5 4.0 - 10.5 K/uL   RBC 3.70 (L) 3.87 - 5.11 MIL/uL   Hemoglobin 10.0 (L) 12.0 - 15.0 g/dL   HCT 09/03/21 (L) 37.0 - 48.8 %   MCV 86.2 80.0 - 100.0 fL   MCH 27.0 26.0 - 34.0 pg   MCHC 31.3 30.0 - 36.0 g/dL   RDW 89.1 69.4 - 50.3 %   Platelets 272 150 - 400 K/uL   nRBC 0.0 0.0 - 0.2 %    Patient Active Problem List    Diagnosis Date Noted   Intrauterine growth restriction affecting antepartum care of mother 07/02/2021   Elevated blood pressure reading 06/28/2021   Palpitations 06/28/2021   Anemia 05/30/2021   Urticaria 04/29/2021   Recurrent upper respiratory infection (URI) 04/29/2021   Migraine 04/29/2021   Insomnia 04/29/2021   Hyperthyroidism 04/29/2021   Eczema 04/29/2021   Bronchitis 04/29/2021   Asthma 04/29/2021   ADD (attention deficit disorder) 04/29/2021   Pregnancy related leg pain, antepartum, third trimester 04/11/2021   Lightheadedness 03/11/2021   Atypical chest pain 03/11/2021   Orthostatic hypotension 03/11/2021   History of depression 01/22/2021   Hyperthyroidism affecting pregnancy, antepartum 12/31/2020   Anemia, antepartum 12/31/2020   Abnormal Pap smear of cervix 12/31/2020   Supervision of high-risk pregnancy, third trimester 12/24/2020   Cervical radiculopathy 12/13/2020   Slow transit constipation 12/13/2020   Old vaginal laceration 11/23/2019    Assessment/Plan:  Kahlea Cobert is a 30 y.o. 38 at [redacted]w[redacted]d here for IOL for chronic DFM, FGR  #Labor:Cervix 3/60/-1, vertex on exam so will start Pitocin at 2 milliunits, increase by 2 per protocol #Pain: Reviewed options with pt  #FWB: Category I with accels #ID:  GBS neg #MOF: breast #MOC:POPs #Circ:  N/a  [redacted]w[redacted]d, CNM  07/04/2021, 11:45 AM

## 2021-07-04 NOTE — Discharge Summary (Addendum)
Postpartum Discharge Summary    Patient Name: Carla [name] DOB: 20-Sep-1991 MRN: [id number]  Date of admission: 07/04/2021 Delivery date:07/04/2021  Delivering provider: Fatima Blank A  Date of discharge: 07/06/2021  Admitting diagnosis: Decreased fetal movement [O36.8190] Intrauterine pregnancy: [redacted]w[redacted]d    Secondary diagnosis:  Active Problems:   NSVD (normal spontaneous vaginal delivery)  Additional problems: none    Discharge diagnosis: Term Pregnancy Delivered                                              Post partum procedures: None Augmentation: Pitocin Complications: None  Hospital course: Induction of Labor With Vaginal Delivery   30y.o. yo [id number] admitted to the hospital 07/04/2021 for induction of labor.  Indication for induction:  DFM, FGR .  Patient had an uncomplicated labor course as follows: Membrane Rupture Time/Date: 3:35 PM ,07/04/2021   Delivery Method:Vaginal, Spontaneous  Episiotomy: None  Lacerations:  None  Details of delivery can be found in separate delivery note.  Patient had a routine postpartum course. Patient is discharged home 07/06/21.  Newborn Data: Birth date:07/04/2021  Birth time:5:35 PM  Gender:Female  Living status:Living  Apgars:9 ,9  Weight:5 lb 1.3 oz (2.305 kg)   Magnesium Sulfate received: No BMZ received: No Rhophylac:No MMR:No T-DaP:Given prenatally Flu: No Transfusion:No  Physical exam  Vitals:   07/05/21 0900 07/05/21 1414 07/05/21 2247 07/06/21 0623  BP: 136/90 128/85 123/82 120/90  Pulse: 67 82 82 73  Resp: 18 20 18 18   Temp: 98.2 F (36.8 C) 98.3 F (36.8 C) 98.2 F (36.8 C) 97.6 F (36.4 C)  TempSrc: Oral Oral Oral Oral  SpO2:  100% 100% 100%  Weight:      Height:       Review of BP warrants hypertensive meds on discharge, pt is on 1025mrocardia BID and being followed closely by cardio (with BP check in one week). No change to procardia dosing at this time.  General: alert,  cooperative, and no distress Lochia: appropriate Uterine Fundus: firm Incision: N/A DVT Evaluation: No evidence of DVT seen on physical exam. Labs: Lab Results  Component Value Date   WBC 7.5 07/04/2021   HGB 10.0 (L) 07/04/2021   HCT 31.9 (L) 07/04/2021   MCV 86.2 07/04/2021   PLT 272 07/04/2021   CMP Latest Ref Rng & Units 07/04/2021  Glucose 70 - 99 mg/dL 77  BUN 6 - 20 mg/dL <5(L)  Creatinine 0.44 - 1.00 mg/dL 0.67  Sodium 135 - 145 mmol/L 134(L)  Potassium 3.5 - 5.1 mmol/L 3.6  Chloride 98 - 111 mmol/L 105  CO2 22 - 32 mmol/L 22  Calcium 8.9 - 10.3 mg/dL 8.4(L)  Total Protein 6.5 - 8.1 g/dL 6.0(L)  Total Bilirubin 0.3 - 1.2 mg/dL 0.2(L)  Alkaline Phos 38 - 126 U/L 129(H)  AST 15 - 41 U/L 22  ALT 0 - 44 U/L 17   Edinburgh Score: Edinburgh Postnatal Depression Scale Screening Tool 07/04/2021  I have been able to laugh and see the funny side of things. (No Data)   After visit meds:  Allergies as of 07/06/2021       Reactions   Latex Hives        Medication List     STOP taking these medications    acetaminophen 325 MG tablet Commonly known as: TYLENOL  cyclobenzaprine 10 MG tablet Commonly known as: FLEXERIL   promethazine 25 MG suppository Commonly known as: PHENERGAN   promethazine 25 MG tablet Commonly known as: PHENERGAN   scopolamine 1 MG/3DAYS Commonly known as: Transderm-Scop (1.5 MG)       TAKE these medications    albuterol 108 (90 Base) MCG/ACT inhaler Commonly known as: VENTOLIN HFA Inhale 2 puffs into the lungs every 6 (six) hours as needed for wheezing or shortness of breath.   Butalbital-APAP-Caffeine 50-325-40 MG capsule Take 1-2 capsules by mouth every 6 (six) hours as needed for headache.   cholecalciferol 25 MCG (1000 UNIT) tablet Commonly known as: VITAMIN D3 Take 1,000 Units by mouth daily.   ferrous sulfate 325 (65 FE) MG tablet Commonly known as: FerrouSul Take 1 tablet (325 mg total) by mouth every other day.    ibuprofen 600 MG tablet Commonly known as: ADVIL Take 1 tablet (600 mg total) by mouth every 6 (six) hours.   NIFEdipine 10 MG capsule Commonly known as: PROCARDIA Take 1 capsule (10 mg total) by mouth in the morning and at bedtime.   prenatal multivitamin Tabs tablet Take 1 tablet by mouth daily at 12 noon.       Discharge home in stable condition Infant Feeding: Breast Infant Disposition:home with mother Discharge instruction: per After Visit Summary and Postpartum booklet. Activity: Advance as tolerated. Pelvic rest for 6 weeks.  Diet: routine diet Future Appointments: Future Appointments  Date Time Provider [place]  07/09/2021  3:30 PM Marcial Pacas, MD [facility] None  07/11/2021 10:00 AM [facility] NURSE [facility] None  07/11/2021  3:00 PM Tobb, Kardie, DO [facility] None  08/15/2021  1:50 PM Truett Mainland, DO [facility] None  08/30/2021 10:10 AM Shamleffer, Melanie Crazier, MD [facility] None   Follow up Visit:  Message sent to [facility] on 07/04/21:  Please schedule this patient for a In person postpartum visit in 4 weeks with the following provider: Any provider. Additional Postpartum F/U:BP check 1 week  High risk pregnancy complicated by:  fetal growth restriction Delivery mode:  Vaginal, Spontaneous  Anticipated Birth Control:  POPs   07/06/2021 Gabriel Carina, CNM

## 2021-07-05 ENCOUNTER — Inpatient Hospital Stay (HOSPITAL_COMMUNITY): Payer: Medicaid Other

## 2021-07-05 NOTE — Progress Notes (Signed)
Post Partum Day 1 s/p SVD Subjective: no complaints, up ad lib, voiding, tolerating PO, and + flatus  Objective: Blood pressure 136/90, pulse 67, temperature 98.2 F (36.8 C), temperature source Oral, resp. rate 18, height 5\' 9"  (1.753 m), weight 81.2 kg, SpO2 100 %, unknown if currently breastfeeding.  Physical Exam:  General: alert, cooperative, appears stated age, and no distress Lochia: appropriate Uterine Fundus: firm Incision: healing well DVT Evaluation: No evidence of DVT seen on physical exam. No cords or calf tenderness. Calf/Ankle edema is present.  Recent Labs    07/04/21 1021  HGB 10.0*  HCT 31.9*    Assessment/Plan: Plan for discharge tomorrow, Breastfeeding, and Contraception POP - Baby girl doing well in room - had the complication of FGR and is SGA - VS stable - had primarily normal Bps since delivery but will continue to monitor since most recent was 136/90. Will continue to watch them today to ensure no indication for anti-HTN management    LOS: 1 day   09/03/21 07/05/2021, 9:20 AM

## 2021-07-05 NOTE — Social Work (Signed)
CSW received consult for hx of Depression.  CSW met with MOB to offer support and complete assessment.     CSW introduced self and role. CSW observed MOB dressing infant 'Talynn'. CSW informed MOB of reason for consult and assessed feelings. MOB was pleasant and receptive of visit. MOB smiled as she stated she feels much better. MOB expressed the pregnancy was rocky, dealing with hormones, however now she is feeling better all around. MOB reported she has never been diagnosed with depression and was only experiencing symptoms during pregnancy.  MOB stated she has never taken medications and has been to therapy years ago for an isolated situation. MOB identified FOB 'Todd' and her mother as a primary support. MOB denies any current SI, HI or DV.   CSW provided education regarding the baby blues period versus PPD and provided resources. MOB disclosed she experienced PPD immediately following the birth of her first child and was open to the resources. CSW provided the New Mom Checklist and encouraged MOB to self evaluate and contact a medical professional if symptoms are noted at any time.   CSW provided review of Sudden Infant Death Syndrome (SIDS) precautions. MOB has everything needed for infant, including a crib and car seat. MOB identified [facility] for follow-up care and denies any barriers to care.    CSW identifies no further need for intervention and no barriers to discharge at this time.  Heidi Lemay, LCSWA Clinical Social Work [facility] [phone] 

## 2021-07-05 NOTE — Lactation Note (Signed)
This note was copied from a baby's chart. Lactation Consultation Note  Patient Name: Girl Jillienne Egner WGNFA'O Date: 07/05/2021 Reason for consult: Initial assessment;Early term 37-38.6wks;Infant < 6lbs Age:31 hours   P3 mother whose infant is now 62 hours old.  This is an ETI at 37+0 weeks weighing < 6 lbs.  Mother breast fed her first two children (now 16 and 63 years old) for 4-5 years each.  Baby was swaddled and asleep in the bassinet when I arrived.  Mother had no questions/concerns related to breast feeding.  She reported that baby has latched and fed well since birth and that her breasts are already "filling."  Last LATCH score was a 10. Discussed the "ETI" and expectations; suggested feeding at least every three hours and sooner if baby shows cues.  Encouraged hand expression before/after feedings to help obtain a good milk supply.    Previous shift RN had set up the DEBP for mother.  She has not yet started pumping; suggested she begin today after every feeding for 15 minutes.  Mother did not desire to review the pump or have any questions regarding pumping.    Mom made aware of O/P services, breastfeeding support groups, community resources, and our phone # for post-discharge questions.  Mother has a DEBP for home use.  Father present and asleep on the couch.   Maternal Data Has patient been taught Hand Expression?: Yes Does the patient have breastfeeding experience prior to this delivery?: Yes How long did the patient breastfeed?: 4-5 years with her first two children  Feeding Mother's Current Feeding Choice: Breast Milk  LATCH Score                    Lactation Tools Discussed/Used Tools: Pump;Flanges Flange Size: 24;27 Breast pump type: Double-Electric Breast Pump;Manual Pump Education: Setup, frequency, and cleaning (Mother did not wish to review; pump set up by night shift RN) Reason for Pumping: Breast stimulation and supplementation for  ETI Pumping frequency: Every three hours  Interventions    Discharge Pump: DEBP;Manual;Personal WIC Program: No  Consult Status Consult Status: Follow-up Date: 07/06/21 Follow-up type: In-patient    Dillyn Menna R Noach Calvillo 07/05/2021, 8:02 AM

## 2021-07-06 MED ORDER — IBUPROFEN 600 MG PO TABS: 600.0000 mg | ORAL_TABLET | Freq: Four times a day (QID) | ORAL | 0 refills | Status: DC

## 2021-07-06 NOTE — Lactation Note (Signed)
This note was copied from a baby's chart. Lactation Consultation Note  Patient Name: [name] SWNIO'E Date: 07/06/2021 Reason for consult: Follow-up assessment;Early term 37-38.6wks;Infant < 6lbs Age:31 hours  Mom had finished feeding infant when LC entered room but infant was still cueing.  LC observed mom latch in cradle hold.  Infant had long pauses at the breast.  A few swallows were heard.  LC encouraged mom to use massage and compression to encourage more swallows and continuous sucking.   BF basics reviewed before.  Maternal Data    Feeding Mother's Current Feeding Choice: Breast Milk  LATCH Score Latch: Grasps breast easily, tongue down, lips flanged, rhythmical sucking.  Audible Swallowing: A few with stimulation  Type of Nipple: Everted at rest and after stimulation  Comfort (Breast/Nipple): Soft / non-tender  Hold (Positioning): Assistance needed to correctly position infant at breast and maintain latch.  LATCH Score: 8   Lactation Tools Discussed/Used    Interventions Interventions: Breast feeding basics reviewed;Education;Support pillows  Discharge Discharge Education: Warning signs for feeding baby;Engorgement and breast care Pump: Personal;Manual  Consult Status Consult Status: Complete Date: 07/06/21 Follow-up type: In-patient    Maryruth Hancock [facility] 07/06/2021, 9:20 AM

## 2021-07-09 ENCOUNTER — Ambulatory Visit: Payer: Medicaid Other | Admitting: Neurology

## 2021-07-09 ENCOUNTER — Ambulatory Visit: Payer: Medicaid Other

## 2021-07-11 ENCOUNTER — Encounter: Payer: Medicaid Other | Admitting: Family Medicine

## 2021-07-11 ENCOUNTER — Other Ambulatory Visit: Payer: Self-pay

## 2021-07-11 ENCOUNTER — Encounter: Payer: Self-pay | Admitting: Cardiology

## 2021-07-11 ENCOUNTER — Telehealth (INDEPENDENT_AMBULATORY_CARE_PROVIDER_SITE_OTHER): Payer: Medicaid Other | Admitting: Cardiology

## 2021-07-11 ENCOUNTER — Ambulatory Visit (INDEPENDENT_AMBULATORY_CARE_PROVIDER_SITE_OTHER): Payer: Medicaid Other

## 2021-07-11 VITALS — BP 131/83 | HR 81 | Ht 69.0 in | Wt 168.0 lb

## 2021-07-11 VITALS — BP 131/83 | HR 81 | Wt 168.1 lb

## 2021-07-11 DIAGNOSIS — Z0130 Encounter for examination of blood pressure without abnormal findings: Secondary | ICD-10-CM

## 2021-07-11 DIAGNOSIS — O165 Unspecified maternal hypertension, complicating the puerperium: Secondary | ICD-10-CM | POA: Diagnosis not present

## 2021-07-11 MED ORDER — NIFEDIPINE 20 MG PO CAPS: 20.0000 mg | ORAL_CAPSULE | Freq: Three times a day (TID) | ORAL | 5 refills | Status: DC

## 2021-07-11 NOTE — Progress Notes (Signed)
Chart reviewed - agree with CMA/RN documentation.  ° °

## 2021-07-11 NOTE — Progress Notes (Signed)
Virtual Visit via Video Note   This visit type was conducted due to national recommendations for restrictions regarding the COVID-19 Pandemic (e.g. social distancing) in an effort to limit this patient's exposure and mitigate transmission in our community.  Due to her co-morbid illnesses, this patient is at least at moderate risk for complications without adequate follow up.  This format is felt to be most appropriate for this patient at this time.  All issues noted in this document were discussed and addressed.  A limited physical exam was performed with this format.  Please refer to the patient's chart for her consent to telehealth for [facility].      Date:  07/12/2021   ID:  Carla [name], DOB 1990-11-30, MRN [id number]  Patient Location: Home virtual Visit via Video I connected with the patient on July 11, 2021 on video enabled telemedicine application and verified that I am speaking with the correct person using two identifiers.   Provider Location: Office/Clinic  PCP:  Pcp, No  Cardiologist:  Thomasene Ripple, DO  Electrophysiologist:  None   Evaluation Performed:  Follow-Up Visit  Chief Complaint: Blood pressure still elevated  History of Present Illness:    Carla [name] is a 30 y.o. female with hypertension pregnancy and postpartum hypertension is here today for follow-up visit in the cardio obstetrics clinic via video visit.  I saw the patient on June 28, 2021 at that time her blood pressure was elevated therefore I started patient on Procardia 10 mg twice a day to make sure that we can keep her under control.  At that time she had schedule delivery.  Since I saw the patient she has been able to undergo delivery safely.  She is here today and tells me that postdelivery she had had some elevated blood pressure.   The patient does not have symptoms concerning for COVID-19 infection (fever, chills, cough, or new shortness of breath).    Past Medical  History:  Diagnosis Date   ADD (attention deficit disorder)    Anemia    Asthma    Bronchitis    Eczema    Heart palpitations    Hyperthyroidism    Insomnia    Migraine    Recurrent upper respiratory infection (URI)    Urticaria    Past Surgical History:  Procedure Laterality Date   ADENOIDECTOMY     NASAL SEPTOPLASTY W/ TURBINOPLASTY  2017   SINOSCOPY     TONSILLECTOMY     VAGINA RECONSTRUCTION SURGERY N/A      Current Meds  Medication Sig   albuterol (PROVENTIL HFA;VENTOLIN HFA) 108 (90 Base) MCG/ACT inhaler Inhale 2 puffs into the lungs every 6 (six) hours as needed for wheezing or shortness of breath.   Butalbital-APAP-Caffeine 50-325-40 MG capsule Take 1-2 capsules by mouth every 6 (six) hours as needed for headache.   cholecalciferol (VITAMIN D3) 25 MCG (1000 UNIT) tablet Take 1,000 Units by mouth daily.   ferrous sulfate (FERROUSUL) 325 (65 FE) MG tablet Take 1 tablet (325 mg total) by mouth every other day.   ibuprofen (ADVIL) 600 MG tablet Take 1 tablet (600 mg total) by mouth every 6 (six) hours.   NIFEdipine (PROCARDIA) 20 MG capsule Take 1 capsule (20 mg total) by mouth 3 (three) times daily.   Prenatal Vit-Fe Fumarate-FA (PRENATAL MULTIVITAMIN) TABS tablet Take 1 tablet by mouth daily at 12 noon.   [DISCONTINUED] NIFEdipine (PROCARDIA) 10 MG capsule Take 1 capsule (10 mg total) by mouth in  the morning and at bedtime.     Allergies:   Latex   Social History   Tobacco Use   Smoking status: Former    Packs/day: 0.25    Years: 4.00    Pack years: 1.00    Types: Cigarettes    Quit date: 08/18/2018    Years since quitting: 2.9   Smokeless tobacco: Never   Tobacco comments:    patient quit 2 weeks ago. 09-01-18  Vaping Use   Vaping Use: Never used  Substance Use Topics   Alcohol use: Never   Drug use: Never     Family Hx: The patient's family history includes Diabetes in her brother, father, maternal grandmother, and paternal grandfather; Hypertension in  her father and mother; Stroke in her maternal grandmother. There is no history of Heart disease.  ROS:   Please see the history of present illness.     All other systems reviewed and are negative.   Prior CV studies:   The following studies were reviewed today:  Zio Monitor  The patient wore the monitor for 14 days starting January 29, 2021.   Indication: Palpitations   The minimum heart rate was 57 bpm, maximum heart rate was 174 bpm, and average heart rate was 86 bpm. Predominant underlying rhythm was Sinus Rhythm.     Premature atrial complexes were rare less than 1%. Premature Ventricular complexes were rare less than 1%.   No ventricular tachycardia, no significant pauses, No AV block and no atrial fibrillation present.   A patient triggered events and 7 diary events are associated with sinus rhythm and sinus tachycardia.   Conclusion: Normal/unremarkable study.  We will have to monitor the patient closely as I do suspect that inappropriate sinus tachycardia may be playing a role here.     Transthoracic echocardiogram 02/27/2021 IMPRESSIONS   1. Left ventricular ejection fraction, by estimation, is 60 to 65%. The left ventricle has normal function. The left ventricle has no regional wall motion abnormalities. Left ventricular diastolic parameters were  normal.  2. Right ventricular systolic function is normal. The right ventricular size is normal. There is normal pulmonary artery systolic pressure.   3. The mitral valve is normal in structure. Trivial mitral valve regurgitation. No evidence of mitral stenosis.   4. The aortic valve is normal in structure. Aortic valve regurgitation is not visualized. No aortic stenosis is present.   5. The inferior vena cava is normal in size with greater than 50% respiratory variability, suggesting right atrial pressure of 3 mmHg.   FINDINGS   Left Ventricle: Left ventricular ejection fraction, by estimation, is 60 to 65%. The left ventricle  has normal function. The left ventricle has no regional wall motion abnormalities. The left ventricular internal cavity  size was normal in size. There is  no left ventricular hypertrophy. Left ventricular diastolic parameters  were normal.   Right Ventricle: The right ventricular size is normal. No increase in right ventricular wall thickness. Right ventricular systolic function is normal. There is normal pulmonary artery systolic pressure. The tricuspid  regurgitant velocity is 1.76 m/s, and  with an assumed right atrial pressure of 3 mmHg, the estimated right ventricular systolic pressure is 15.4 mmHg.   Left Atrium: Left atrial size was normal in size.   Right Atrium: Right atrial size was normal in size.   Pericardium: There is no evidence of pericardial effusion.   Mitral Valve: The mitral valve is normal in structure. Trivial mitral  valve regurgitation. No evidence  of mitral valve stenosis.   Tricuspid Valve: The tricuspid valve is normal in structure. Tricuspid  valve regurgitation is not demonstrated. No evidence of tricuspid  stenosis.   Aortic Valve: The aortic valve is normal in structure. Aortic valve  regurgitation is not visualized. No aortic stenosis is present.   Pulmonic Valve: The pulmonic valve was normal in structure. Pulmonic valve  regurgitation is not visualized. No evidence of pulmonic stenosis.   Aorta: The aortic root is normal in size and structure.   Venous: The inferior vena cava is normal in size with greater than 50%  respiratory variability, suggesting right atrial pressure of 3 mmHg.   IAS/Shunts: No atrial level shunt detected by color flow Doppler.     ZIO monitor which was in December 2021 The basic rhythm is normal sinus with an average HR of 85 bpm, periods of sinus tachycardia noted No atrial fibrillation or flutter No high-grade heart block or pathologic pauses There are rare PVC's and rare supraventricular beats without sustained  arrhythmias  Labs/Other Tests and Data Reviewed:    EKG:  No ECG reviewed.  Recent Labs: 03/11/2021: Magnesium 1.6 06/27/2021: TSH 1.32 07/04/2021: ALT 17; BUN <5; Creatinine, Ser 0.67; Hemoglobin 10.0; Platelets 272; Potassium 3.6; Sodium 134   Recent Lipid Panel No results found for: CHOL, TRIG, HDL, CHOLHDL, LDLCALC, LDLDIRECT  Wt Readings from Last 3 Encounters:  07/11/21 168 lb (76.2 kg)  07/11/21 168 lb 1.9 oz (76.3 kg)  07/04/21 179 lb (81.2 kg)     Objective:    Vital Signs:  BP 131/83   Pulse 81   Ht 5\' 9"  (1.753 m)   Wt 168 lb (76.2 kg)   LMP  (LMP Unknown) Comment: reports irreg cycles, ? LMP sometime in Oct, spotted ~11/3  BMI 24.81 kg/m    No physical exam performed this was a virtual visit.  ASSESSMENT & PLAN:    Postpartum hypertension   She still is experiencing elevated blood pressure at home.  Going up systolics into the 140s 150s.  We will likely do is increase her nifedipine to 20 mg every 8 hours for now.   She will take her blood pressure daily and send this information  COVID-19 Education: The signs and symptoms of COVID-19 were discussed with the patient and how to seek care for testing (follow up with PCP or arrange E-visit).  The importance of social distancing was discussed today.  Time:   Today, I have spent 10 minutes with the patient with telehealth technology discussing the above problems.     Medication Adjustments/Labs and Tests Ordered: Current medicines are reviewed at length with the patient today.  Concerns regarding medicines are outlined above.   Tests Ordered: No orders of the defined types were placed in this encounter.   Medication Changes: Meds ordered this encounter  Medications   NIFEdipine (PROCARDIA) 20 MG capsule    Sig: Take 1 capsule (20 mg total) by mouth 3 (three) times daily.    Dispense:  180 capsule    Refill:  5    Follow Up:  Virtual Visit    06-03-1985, DO  07/12/2021 9:24 PM    [facility]

## 2021-07-11 NOTE — Progress Notes (Signed)
Subjective:  Carla [name] is a 30 y.o. female here for BP check.   Hypertension ROS: taking medications as instructed, no medication side effects noted, no TIA's, no chest pain on exertion, no dyspnea on exertion, and no swelling of ankles.    Objective:  LMP  (LMP Unknown) Comment: reports irreg cycles, ? LMP sometime in Oct, spotted ~11/3  Appearance alert, well appearing, and in no distress. General exam BP noted to be well controlled today in office.    Assessment:   Blood Pressure improved.   Plan:  Current treatment plan is effective, no change in therapy. Pt will f/u at [facility] appt.

## 2021-07-11 NOTE — Patient Instructions (Signed)
Medication Instructions:  Your physician has recommended you make the following change in your medication:  INCREASE:  Nifedipine 20 mg every 8 hours *If you need a refill on your cardiac medications before your next appointment, please call your pharmacy*   Lab Work: None If you have labs (blood work) drawn today and your tests are completely normal, you will receive your results only by: MyChart Message (if you have MyChart) OR A paper copy in the mail If you have any lab test that is abnormal or we need to change your treatment, we will call you to review the results.   Testing/Procedures: None   Follow-Up: At [facility], you and your health needs are our priority.  As part of our continuing mission to provide you with exceptional heart care, we have created designated Provider Care Teams.  These Care Teams include your primary Cardiologist (physician) and Advanced Practice Providers (APPs -  Physician Assistants and Nurse Practitioners) who all work together to provide you with the care you need, when you need it.  We recommend signing up for the patient portal called "MyChart".  Sign up information is provided on this After Visit Summary.  MyChart is used to connect with patients for Virtual Visits (Telemedicine).  Patients are able to view lab/test results, encounter notes, upcoming appointments, etc.  Non-urgent messages can be sent to your provider as well.   To learn more about what you can do with MyChart, go to [email or web address].    Your next appointment:   6 week(s)  The format for your next appointment:   Virtual Visit   Provider:   Elease Hashimoto - Thomasene Ripple, DO [address], [place], [place] [address]    Other Instructions Please take your blood pressure for one week and send the readings in a MyChart message.

## 2021-07-12 ENCOUNTER — Telehealth: Payer: Self-pay

## 2021-07-12 ENCOUNTER — Telehealth: Payer: Self-pay | Admitting: Cardiology

## 2021-07-12 NOTE — Telephone Encounter (Signed)
Called patient to go over her discharge instructions from yesterday. She verbalized understanding, no questions at this time.

## 2021-07-12 NOTE — Telephone Encounter (Signed)
Spoke with patient and made an appointment.  See chart.

## 2021-07-12 NOTE — Telephone Encounter (Signed)
Patient states she is returning a call for yesterday.

## 2021-07-16 ENCOUNTER — Telehealth (HOSPITAL_COMMUNITY): Payer: Self-pay

## 2021-07-16 NOTE — Telephone Encounter (Signed)
"  I'm doing good. Catching up on some sleep." Patient declines any questions or concerns about her healing.  "She's really good. She is eating and gaining weight. She sleeps in a crib next to my bed."RN reviewed ABC's of safe sleep with patient. Patient declines any questions or concerns about baby.  EPDS score is 0.   Marcelino Duster [facility] 07/16/2021,1220

## 2021-07-18 ENCOUNTER — Encounter: Payer: Medicaid Other | Admitting: Family Medicine

## 2021-07-24 ENCOUNTER — Encounter: Payer: Medicaid Other | Admitting: Family Medicine

## 2021-08-15 ENCOUNTER — Encounter: Payer: Self-pay | Admitting: Family Medicine

## 2021-08-15 ENCOUNTER — Other Ambulatory Visit: Payer: Self-pay

## 2021-08-15 ENCOUNTER — Ambulatory Visit (INDEPENDENT_AMBULATORY_CARE_PROVIDER_SITE_OTHER): Payer: Medicaid Other | Admitting: Family Medicine

## 2021-08-15 ENCOUNTER — Other Ambulatory Visit (HOSPITAL_BASED_OUTPATIENT_CLINIC_OR_DEPARTMENT_OTHER): Payer: Self-pay

## 2021-08-15 DIAGNOSIS — E0590 Thyrotoxicosis, unspecified without thyrotoxic crisis or storm: Secondary | ICD-10-CM

## 2021-08-15 DIAGNOSIS — R030 Elevated blood-pressure reading, without diagnosis of hypertension: Secondary | ICD-10-CM

## 2021-08-15 DIAGNOSIS — O99280 Endocrine, nutritional and metabolic diseases complicating pregnancy, unspecified trimester: Secondary | ICD-10-CM

## 2021-08-15 DIAGNOSIS — Z30013 Encounter for initial prescription of injectable contraceptive: Secondary | ICD-10-CM

## 2021-08-15 MED ORDER — MEDROXYPROGESTERONE ACETATE 150 MG/ML IM SUSP
150.0000 mg | Freq: Once | INTRAMUSCULAR | Status: AC
  Administered 2021-08-15: 150 mg via INTRAMUSCULAR

## 2021-08-15 MED ORDER — MEDROXYPROGESTERONE ACETATE 150 MG/ML IM SUSY
150.0000 mg | PREFILLED_SYRINGE | INTRAMUSCULAR | 0 refills | Status: DC
  Filled 2021-08-15: qty 1, 90d supply, fill #0

## 2021-08-15 NOTE — Addendum Note (Signed)
Addended by: Anell Barr on: 08/15/2021 02:20 PM   Modules accepted: Orders

## 2021-08-15 NOTE — Progress Notes (Signed)
Patient complaining of headaches and some occasional blurry vision.   Post Partum Visit Note  Sharde Gover is a 30 y.o. [phone] female who presents for a postpartum visit. She is 6 weeks postpartum following a normal spontaneous vaginal delivery.  I have fully reviewed the prenatal and intrapartum course. The delivery was at 37 gestational weeks.  Anesthesia: none. Postpartum course has been uneventful. Baby is has VSD and is followed by [facility]. Baby is feeding by breast. Bleeding no bleeding. Bowel function is normal. Bladder function is normal. Patient is not sexually active. Contraception method is debating between mini pill or Depo Provera Postpartum depression screening: negative.   The pregnancy intention screening data noted above was reviewed. Potential methods of contraception were discussed. The patient elected to proceed with No data recorded.   Edinburgh Postnatal Depression Scale - 08/15/21 1349       Edinburgh Postnatal Depression Scale:  In the Past 7 Days   I have been able to laugh and see the funny side of things. 0    I have looked forward with enjoyment to things. 0    I have blamed myself unnecessarily when things went wrong. 0    I have been anxious or worried for no good reason. 1    I have felt scared or panicky for no good reason. 1    Things have been getting on top of me. 0    I have been so unhappy that I have had difficulty sleeping. 0    I have felt sad or miserable. 0    I have been so unhappy that I have been crying. 0    The thought of harming myself has occurred to me. 0    Edinburgh Postnatal Depression Scale Total 2             Health Maintenance Due  Topic Date Due   COVID-19 Vaccine (1) Never done   INFLUENZA VACCINE  Never done    The following portions of the patient's history were reviewed and updated as appropriate: allergies, current medications, past family history, past medical history, past social history, past surgical  history, and problem list.  Review of Systems Pertinent items are noted in HPI.  Objective:  BP 139/78   Pulse 83   Ht 5' 9.75" (1.772 m)   Wt 170 lb (77.1 kg)   LMP  (LMP Unknown) Comment: reports irreg cycles, ? LMP sometime in Oct, spotted ~11/3  Breastfeeding Yes   BMI 24.57 kg/m    General:  alert, cooperative, and no distress   Breasts:  not indicated  Lungs: clear to auscultation bilaterally  Heart:  regular rate and rhythm, S1, S2 normal, no murmur, click, rub or gallop  Abdomen: soft, non-tender; bowel sounds normal; no masses,  no organomegaly   Wound N/a  GU exam:  not indicated       Assessment:   1. Postpartum exam   2. Elevated blood pressure reading   3. Hyperthyroidism affecting pregnancy, antepartum      Plan:   Essential components of care per ACOG recommendations:  1.  Mood and well being: Patient with negative depression screening today. Reviewed local resources for support.  - Patient tobacco use? No.   - hx of drug use? No.    2. Infant care and feeding:  -Patient currently breastmilk feeding? Yes. Reviewed importance of draining breast regularly to support lactation.  -Social determinants of health (SDOH) reviewed in EPIC. No concerns  3. Sexuality,  contraception and birth spacing - Patient does not want a pregnancy in the next year.    - Reviewed forms of contraception in tiered fashion. Patient desired Depo-Provera today.   - Discussed birth spacing of 18 months  4. Sleep and fatigue -Encouraged family/partner/community support of 4 hrs of uninterrupted sleep to help with mood and fatigue  5. Physical Recovery  - Discussed patients delivery and complications. She describes her labor as good. - Patient had a Vaginal, no problems at delivery.  - Patient has urinary incontinence? No. - Patient is safe to resume physical and sexual activity  6.  Health Maintenance - HM due items addressed Yes - Last pap smear  Diagnosis  Date Value Ref  Range Status  12/24/2020 - Low grade squamous intraepithelial lesion (LSIL) (A)  Final   Pap smear not done at today's visit. - Will repeat PAP in 4 months -Breast Cancer screening indicated? No.   7. Chronic Disease/Pregnancy Condition follow up: Hypertension and Hyperthyroidism Sees Dr Servando Salina - PCP follow up  Levie Heritage, DO Center for [facility], [facility]

## 2021-08-21 ENCOUNTER — Telehealth: Payer: Medicaid Other | Admitting: Cardiology

## 2021-08-26 ENCOUNTER — Other Ambulatory Visit (HOSPITAL_BASED_OUTPATIENT_CLINIC_OR_DEPARTMENT_OTHER): Payer: Self-pay

## 2021-08-27 ENCOUNTER — Ambulatory Visit: Payer: Medicaid Other | Admitting: Neurology

## 2021-08-27 ENCOUNTER — Encounter: Payer: Self-pay | Admitting: Neurology

## 2021-08-27 VITALS — BP 111/73 | HR 71 | Ht 69.0 in | Wt 171.0 lb

## 2021-08-27 DIAGNOSIS — M5442 Lumbago with sciatica, left side: Secondary | ICD-10-CM | POA: Diagnosis not present

## 2021-08-27 DIAGNOSIS — G8929 Other chronic pain: Secondary | ICD-10-CM | POA: Diagnosis not present

## 2021-08-27 DIAGNOSIS — M5441 Lumbago with sciatica, right side: Secondary | ICD-10-CM

## 2021-08-27 DIAGNOSIS — G43709 Chronic migraine without aura, not intractable, without status migrainosus: Secondary | ICD-10-CM | POA: Diagnosis not present

## 2021-08-27 MED ORDER — PROPRANOLOL HCL 80 MG PO TABS: 80.0000 mg | ORAL_TABLET | Freq: Two times a day (BID) | ORAL | 11 refills | Status: DC

## 2021-08-27 NOTE — Patient Instructions (Addendum)
Magnesium oxide 400mg + riboflavin 100mg  twice a day   Check your bp daily, call primary care physician if you have persistent hypertension >=140/90  Meds ordered this encounter  Medications   propranolol (INDERAL) 80 MG tablet to replace current nifedipine     Sig: Take 1 tablet (80 mg total) by mouth 2 (two) times daily.    Dispense:  60 tablet    Refill:  11     Fioricet prn for moderate to severe headaches, <=2/week.

## 2021-08-27 NOTE — Progress Notes (Signed)
Chief Complaint  Patient presents with   New Patient (Initial Visit)    New rm, with baby, here for sciatica treatment, no pcp     ASSESSMENT AND PLAN  Carla [name] is a 30 y.o. female   Chronic migraine headache  Delivery on July 04, 2021, she is still breast-feeding,  After extensive discussion with patient, decided to proceed with preventive medication magnesium oxide 400 mg p.o. riboflavin 100 mg twice a day as preventive medications,  She has elevated blood pressure, which is under well control with low-dose of nifedipine 20 mg 3 times a day, we decided to switch over to Inderal 80 mg twice a day as migraine preventive medications, also for blood pressure control, encouraged her to check blood pressure daily,  Fioricet as needed for moderate to severe headaches, she was given Fioricet by her current headache management, taking it daily, without significant improvement of her headache, limit the Fioricet use to less than twice each week  She does not want to try triptan concerning about the side effect with breast-feeding at this point,    DIAGNOSTIC DATA (LABS, IMAGING, TESTING) - I reviewed patient records, labs, notes, testing and imaging myself where available.  I personally reviewed MRI cervical and thoracic spine, that were normal.  Labs in August 2022: Normal CMP, CBC Hg 10. TSH  MEDICAL HISTORY:  Carla [name] is a 30 year old female, seen in request by her primary care physician Dr. Willodean Rosenthal, for evaluation of chronic migraine headaches, initial evaluation was August 27, 2021.  I reviewed and summarized the referring note. PMHx. HTN  She reported history of chronic migraines since 30 years old, gradually increased frequency, during her most recent pregnancy in 2022, she had almost daily headaches, also complains of blurry vision, despite a new pair of glasses in summer of 2021.  Pending ophthalmology evaluation in October  2022  She was seen by headache specialist a few month ago, was put on Fioricet, she is taking daily, but despite that, she complains daily holoacranial retro-orbital area mild to moderate pressure headaches, only temporarily relieved by Fioricet  She denies lateralized motor or sensory deficit, had a history of chronic neck, upper back, low back pain, during the pregnancy, complains of intermittent shooting pain to bilateral lower extremity, improved after delivery,  PHYSICAL EXAM:   Vitals:   08/27/21 0916  BP: 111/73  Pulse: 71  Weight: 171 lb (77.6 kg)  Height: 5\' 9"  (1.753 m)   Not recorded     Body mass index is 25.25 kg/m.  PHYSICAL EXAMNIATION:  Gen: NAD, conversant, well nourised, well groomed                     Cardiovascular: Regular rate rhythm, no peripheral edema, warm, nontender. Eyes: Conjunctivae clear without exudates or hemorrhage Neck: Supple, no carotid bruits. Pulmonary: Clear to auscultation bilaterally   NEUROLOGICAL EXAM:  MENTAL STATUS: Speech:    Speech is normal; fluent and spontaneous with normal comprehension.  Cognition:     Orientation to time, place and person     Normal recent and remote memory     Normal Attention span and concentration     Normal Language, naming, repeating,spontaneous speech     Fund of knowledge   CRANIAL NERVES: CN II: Visual fields are full to confrontation. Pupils are round equal and briskly reactive to light. CN III, IV, VI: extraocular movement are normal. No ptosis. CN V: Facial sensation is intact to light  touch CN VII: Face is symmetric with normal eye closure  CN VIII: Hearing is normal to causal conversation. CN IX, X: Phonation is normal. CN XI: Head turning and shoulder shrug are intact  MOTOR: There is no pronator drift of out-stretched arms. Muscle bulk and tone are normal. Muscle strength is normal.  REFLEXES: Reflexes are 2+ and symmetric at the biceps, triceps, knees, and ankles. Plantar  responses are flexor.  SENSORY: Intact to light touch, pinprick and vibratory sensation are intact in fingers and toes.  COORDINATION: There is no trunk or limb dysmetria noted.  GAIT/STANCE: Posture is normal. Gait is steady with normal steps, base, arm swing, and turning. Heel and toe walking are normal. Tandem gait is normal.  Romberg is absent.  REVIEW OF SYSTEMS:  Full 14 system review of systems performed and notable only for as above All other review of systems were negative.   ALLERGIES: Allergies  Allergen Reactions   Latex Hives    HOME MEDICATIONS: Current Outpatient Medications  Medication Sig Dispense Refill   albuterol (PROVENTIL HFA;VENTOLIN HFA) 108 (90 Base) MCG/ACT inhaler Inhale 2 puffs into the lungs every 6 (six) hours as needed for wheezing or shortness of breath.     Butalbital-APAP-Caffeine 50-325-40 MG capsule Take 1-2 capsules by mouth every 6 (six) hours as needed for headache. 30 capsule 3   cholecalciferol (VITAMIN D3) 25 MCG (1000 UNIT) tablet Take 1,000 Units by mouth daily.     ferrous sulfate (FERROUSUL) 325 (65 FE) MG tablet Take 1 tablet (325 mg total) by mouth every other day. 60 tablet 1   ibuprofen (ADVIL) 600 MG tablet Take 1 tablet (600 mg total) by mouth every 6 (six) hours. 30 tablet 0   medroxyPROGESTERone Acetate 150 MG/ML SUSY Inject 1 mL (150 mg total) into the muscle every 3 (three) months. 1 mL 0   NIFEdipine (PROCARDIA) 20 MG capsule Take 1 capsule (20 mg total) by mouth 3 (three) times daily. 180 capsule 5   Prenatal Vit-Fe Fumarate-FA (PRENATAL MULTIVITAMIN) TABS tablet Take 1 tablet by mouth daily at 12 noon.     No current facility-administered medications for this visit.    PAST MEDICAL HISTORY: Past Medical History:  Diagnosis Date   ADD (attention deficit disorder)    Anemia    Asthma    Bronchitis    Eczema    Heart palpitations    Hyperthyroidism    Insomnia    Migraine    Recurrent upper respiratory infection  (URI)    Urticaria     PAST SURGICAL HISTORY: Past Surgical History:  Procedure Laterality Date   ADENOIDECTOMY     NASAL SEPTOPLASTY W/ TURBINOPLASTY  2017   SINOSCOPY     TONSILLECTOMY     VAGINA RECONSTRUCTION SURGERY N/A     FAMILY HISTORY: Family History  Problem Relation Age of Onset   Hypertension Mother    Hypertension Father    Diabetes Father    Diabetes Brother    Diabetes Maternal Grandmother    Stroke Maternal Grandmother    Diabetes Paternal Grandfather    Heart disease Neg Hx     SOCIAL HISTORY: Social History   Socioeconomic History   Marital status: Single    Spouse name: Not on file   Number of children: 2   Years of education: Not on file   Highest education level: Some college, no degree  Occupational History   Not on file  Tobacco Use   Smoking status: Former  Packs/day: 0.25    Years: 4.00    Pack years: 1.00    Types: Cigarettes    Quit date: 08/18/2018    Years since quitting: 3.0   Smokeless tobacco: Never   Tobacco comments:    patient quit 2 weeks ago. 09-01-18  Vaping Use   Vaping Use: Never used  Substance and Sexual Activity   Alcohol use: Never   Drug use: Never   Sexual activity: Yes  Other Topics Concern   Not on file  Social History Narrative   Not on file   Social Determinants of Health   Financial Resource Strain: Not on file  Food Insecurity: Not on file  Transportation Needs: Not on file  Physical Activity: Not on file  Stress: Not on file  Social Connections: Not on file  Intimate Partner Violence: Not on file      Levert Feinstein, M.D. Ph.D.  [facility] [address] [place], [place] [address] Ph: [phone] Fax: [phone]  CC:  Willodean Rosenthal, MD [address] [place],  [place] [address]  Pcp, No

## 2021-08-29 NOTE — Progress Notes (Deleted)
Name: Carla [name]  MRN/ DOB: [id number], [birth year]    Age/ Sex: 30 y.o., female     PCP: Pcp, No   Reason for Endocrinology Evaluation: Hyperthyroidism     Initial Endocrinology Clinic Visit: 03/29/2019    PATIENT IDENTIFIER: [name] is a 30 y.o., female with a past medical history of asthma, migraine headaches and hyperthyroidism during pregnancy. She has followed with [facility] since 03/28/2021 for consultative assistance with management of her hyperthyroidism.   HISTORICAL SUMMARY:   Pt was diagnosed with hyperthyroidism in 11/2020 with a suppressed TSH at  0.012 uIU/Ml , shortly after testing positive for pregnancy, these results were confirmed  In 12/2020 with elevated FT3 at 4.7 pg/mL but with normal FT4 at 1.66 ng/dL       Per pt she has been diagnosed with hyperthyroidism for ~ 7 yrs. No prior treatment  On her initial visit to our clinic she was [redacted] weeks pregnant with her third pregnancy.  TFTs were normal and no treatment was offered at the time.  She has a daughter born in 2011 and a son born in 2012   TSI undetectable 05/2021  Has paternal and maternal grandmother history of thyroid disease   SUBJECTIVE:     Today (08/29/2021):  [name] (30 y.o.) is here for Hyperthyroidism.   She is S/P delivery 07/06/2021- baby girl Weight stable  Denies fever  Denies diarrhea  Has occasional tremors  No palpitations     HISTORY:  Past Medical History:  Past Medical History:  Diagnosis Date   ADD (attention deficit disorder)    Anemia    Asthma    Bronchitis    Eczema    Heart palpitations    Hyperthyroidism    Insomnia    Migraine    Recurrent upper respiratory infection (URI)    Urticaria    Past Surgical History:  Past Surgical History:  Procedure Laterality Date   ADENOIDECTOMY     NASAL SEPTOPLASTY W/ TURBINOPLASTY  2017   SINOSCOPY     TONSILLECTOMY     VAGINA RECONSTRUCTION SURGERY N/A    Social  History:  reports that she quit smoking about 3 years ago. Her smoking use included cigarettes. She has a 1.00 pack-year smoking history. She has never used smokeless tobacco. She reports that she does not drink alcohol and does not use drugs. Family History:  Family History  Problem Relation Age of Onset   Hypertension Mother    Hypertension Father    Diabetes Father    Diabetes Brother    Diabetes Maternal Grandmother    Stroke Maternal Grandmother    Diabetes Paternal Grandfather    Heart disease Neg Hx      HOME MEDICATIONS: Allergies as of 08/30/2021       Reactions   Latex Hives        Medication List        Accurate as of August 29, 2021  2:03 PM. If you have any questions, ask your nurse or doctor.          albuterol 108 (90 Base) MCG/ACT inhaler Commonly known as: VENTOLIN HFA Inhale 2 puffs into the lungs every 6 (six) hours as needed for wheezing or shortness of breath.   Butalbital-APAP-Caffeine 50-325-40 MG capsule Take 1-2 capsules by mouth every 6 (six) hours as needed for headache.   cholecalciferol 25 MCG (1000 UNIT) tablet Commonly known as: VITAMIN D3 Take 1,000 Units by mouth daily.  ferrous sulfate 325 (65 FE) MG tablet Commonly known as: FerrouSul Take 1 tablet (325 mg total) by mouth every other day.   ibuprofen 600 MG tablet Commonly known as: ADVIL Take 1 tablet (600 mg total) by mouth every 6 (six) hours.   medroxyPROGESTERone Acetate 150 MG/ML Susy Inject 1 mL (150 mg total) into the muscle every 3 (three) months.   NIFEdipine 20 MG capsule Commonly known as: PROCARDIA Take 1 capsule (20 mg total) by mouth 3 (three) times daily.   prenatal multivitamin Tabs tablet Take 1 tablet by mouth daily at 12 noon.   propranolol 80 MG tablet Commonly known as: INDERAL Take 1 tablet (80 mg total) by mouth 2 (two) times daily.          OBJECTIVE:   PHYSICAL EXAM: VS: There were no vitals taken for this visit.    EXAM: General: Pt appears well and is in NAD  Neck: General: Supple without adenopathy. Thyroid: Thyroid size normal.  No goiter or nodules appreciated.   Lungs: Clear with good BS bilat with no rales, rhonchi, or wheezes  Heart: Auscultation: RRR.  Abdomen: Gravid uterus , soft, nontender   Extremities: BL LE: No pretibial edema normal ROM and strength.     DATA REVIEWED:     TSI <140 % baseline <89    ASSESSMENT / PLAN / RECOMMENDATIONS:   Hyperthyroidism during pregnancy   - Pt is clinically euthyroid - No local neck symptoms  - TSh normal but T4 and T3 are elevated but trending down  -The goal of treatment is to maintain persistent but mild hyperthyroidism in the mother in an attempt to prevent fetal hypothyroidism since the fetal thyroid is more sensitive to the action of thionamide therapy. Overtreatment of maternal hyperthyroidism can cause fetal goiter and primary hypothyroidism.  - TSI pending    Medications   N/A   F/U in 3 months  Labs in 4 weeks     Signed electronically by: Lyndle Herrlich, MD  [name]  [name] [address] [place] [place], [place] [address] Phone: [phone] FAX: [phone]      CC: Pcp, No No address on file Phone: None  Fax: None   Return to Endocrinology clinic as below: Future Appointments  Date Time Provider Department [name]  08/30/2021 10:10 AM Kelson Queenan, Konrad Dolores, MD [facility] None  09/12/2021  2:20 PM Thomasene Ripple, DO [facility]  11/05/2021  8:30 AM [facility] NURSE [facility] None  12/05/2021  8:15 AM Glean Salvo, NP [facility] None

## 2021-08-30 ENCOUNTER — Ambulatory Visit: Payer: Medicaid Other | Admitting: Internal Medicine

## 2021-08-30 ENCOUNTER — Encounter: Payer: Self-pay | Admitting: Radiology

## 2021-09-12 ENCOUNTER — Encounter: Payer: Self-pay | Admitting: Cardiology

## 2021-09-12 ENCOUNTER — Telehealth (INDEPENDENT_AMBULATORY_CARE_PROVIDER_SITE_OTHER): Payer: Medicaid Other | Admitting: Cardiology

## 2021-09-12 VITALS — BP 111/72 | HR 74 | Ht 69.0 in | Wt 175.0 lb

## 2021-09-12 DIAGNOSIS — O165 Unspecified maternal hypertension, complicating the puerperium: Secondary | ICD-10-CM | POA: Diagnosis not present

## 2021-09-12 MED ORDER — PROPRANOLOL HCL 40 MG PO TABS: 40.0000 mg | ORAL_TABLET | Freq: Two times a day (BID) | ORAL | 3 refills | Status: DC

## 2021-09-12 NOTE — Progress Notes (Signed)
Cardio-Obstetrics Clinic  Follow Up Note   Date:  09/12/2021   ID:  Carla [name], DOB [birth year], MRN [id number]  PCP:  Pcp, No   [facility] Providers Cardiologist:  Thomasene Ripple, DO  Electrophysiologist:  None        The patient is at home. I am in the office.  Virtual Visit via Video  Note . I connected with the patient today by a   video enabled telemedicine application and verified that I am speaking with the correct person using two identifiers.  Referring MD: No ref. provider found   Chief Complaint: I am doing very well  History of Present Illness:    Carla [name] is a 30 y.o. female [[id number]] who returns for follow up postpartum/gestational hypertension.  She has a medical history of hypertension pregnancy/postpartum hypertension is here today for follow-up visit in the cardio obstetrics clinic via video visit.  I saw the patient on June 28, 2021 at that time her blood pressure was elevated therefore I started patient on Procardia 10 mg twice a day to make sure that we can keep her under control.  At that time she had schedule delivery.  Since I saw the patient she has been able to undergo delivery safely.  She is here today and tells me that postdelivery she had had some elevated blood pressure.  I saw the patient on July 12, 2019 during her postpartum visit her blood pressure is still elevated and therefore increase her nifedipine to 20 mg every 8 hours.  Since I saw the patient she has seen neurology and her nifedipine was stopped and she has been placed on Inderal 80 mg twice a day which is also for her migraine prevention.  Her blood pressure has been on the lower side on the propanolol though.  Prior CV Studies Reviewed: The following studies were reviewed today:    Zio Monitor  The patient wore the monitor for 14 days starting January 29, 2021.   Indication: Palpitations   The minimum heart rate was 57 bpm, maximum heart rate was 174  bpm, and average heart rate was 86 bpm. Predominant underlying rhythm was Sinus Rhythm.     Premature atrial complexes were rare less than 1%. Premature Ventricular complexes were rare less than 1%.   No ventricular tachycardia, no significant pauses, No AV block and no atrial fibrillation present.   A patient triggered events and 7 diary events are associated with sinus rhythm and sinus tachycardia.   Conclusion: Normal/unremarkable study.  We will have to monitor the patient closely as I do suspect that inappropriate sinus tachycardia may be playing a role here.     Transthoracic echocardiogram 02/27/2021 IMPRESSIONS   1. Left ventricular ejection fraction, by estimation, is 60 to 65%. The left ventricle has normal function. The left ventricle has no regional wall motion abnormalities. Left ventricular diastolic parameters were  normal.  2. Right ventricular systolic function is normal. The right ventricular size is normal. There is normal pulmonary artery systolic pressure.   3. The mitral valve is normal in structure. Trivial mitral valve regurgitation. No evidence of mitral stenosis.   4. The aortic valve is normal in structure. Aortic valve regurgitation is not visualized. No aortic stenosis is present.   5. The inferior vena cava is normal in size with greater than 50% respiratory variability, suggesting right atrial pressure of 3 mmHg.   FINDINGS   Left Ventricle: Left ventricular ejection fraction, by estimation, is 60 to  65%. The left ventricle has normal function. The left ventricle has no regional wall motion abnormalities. The left ventricular internal cavity  size was normal in size. There is  no left ventricular hypertrophy. Left ventricular diastolic parameters  were normal.   Right Ventricle: The right ventricular size is normal. No increase in right ventricular wall thickness. Right ventricular systolic function is normal. There is normal pulmonary artery systolic pressure.  The tricuspid  regurgitant velocity is 1.76 m/s, and  with an assumed right atrial pressure of 3 mmHg, the estimated right ventricular systolic pressure is 15.4 mmHg.   Left Atrium: Left atrial size was normal in size.   Right Atrium: Right atrial size was normal in size.   Pericardium: There is no evidence of pericardial effusion.   Mitral Valve: The mitral valve is normal in structure. Trivial mitral  valve regurgitation. No evidence of mitral valve stenosis.   Tricuspid Valve: The tricuspid valve is normal in structure. Tricuspid  valve regurgitation is not demonstrated. No evidence of tricuspid  stenosis.   Aortic Valve: The aortic valve is normal in structure. Aortic valve  regurgitation is not visualized. No aortic stenosis is present.   Pulmonic Valve: The pulmonic valve was normal in structure. Pulmonic valve  regurgitation is not visualized. No evidence of pulmonic stenosis.   Aorta: The aortic root is normal in size and structure.   Venous: The inferior vena cava is normal in size with greater than 50%  respiratory variability, suggesting right atrial pressure of 3 mmHg.   IAS/Shunts: No atrial level shunt detected by color flow Doppler.     ZIO monitor which was in December 2021 The basic rhythm is normal sinus with an average HR of 85 bpm, periods of sinus tachycardia noted No atrial fibrillation or flutter No high-grade heart block or pathologic pauses There are rare PVC's and rare supraventricular beats without sustained arrhythmias   Past Medical History:  Diagnosis Date   ADD (attention deficit disorder)    Anemia    Asthma    Bronchitis    Eczema    Heart palpitations    Hyperthyroidism    Insomnia    Migraine    Recurrent upper respiratory infection (URI)    Urticaria     Past Surgical History:  Procedure Laterality Date   ADENOIDECTOMY     NASAL SEPTOPLASTY W/ TURBINOPLASTY  2017   SINOSCOPY     TONSILLECTOMY     VAGINA RECONSTRUCTION  SURGERY N/A       OB History     Gravida  5   Para  3   Term  3   Preterm      AB  2   Living  3      SAB  1   IAB  1   Ectopic      Multiple  0   Live Births  3               Current Medications: Current Meds  Medication Sig   albuterol (PROVENTIL HFA;VENTOLIN HFA) 108 (90 Base) MCG/ACT inhaler Inhale 2 puffs into the lungs every 6 (six) hours as needed for wheezing or shortness of breath.   Butalbital-APAP-Caffeine 50-325-40 MG capsule Take 1-2 capsules by mouth every 6 (six) hours as needed for headache.   cholecalciferol (VITAMIN D3) 25 MCG (1000 UNIT) tablet Take 1,000 Units by mouth daily.   ferrous sulfate (FERROUSUL) 325 (65 FE) MG tablet Take 1 tablet (325 mg total) by mouth every  other day.   medroxyPROGESTERone Acetate 150 MG/ML SUSY Inject 1 mL (150 mg total) into the muscle every 3 (three) months.   Prenatal Vit-Fe Fumarate-FA (PRENATAL MULTIVITAMIN) TABS tablet Take 1 tablet by mouth daily at 12 noon.   [DISCONTINUED] propranolol (INDERAL) 80 MG tablet Take 1 tablet (80 mg total) by mouth 2 (two) times daily.     Allergies:   Latex   Social History   Socioeconomic History   Marital status: Single    Spouse name: Not on file   Number of children: 2   Years of education: Not on file   Highest education level: Some college, no degree  Occupational History   Not on file  Tobacco Use   Smoking status: Former    Packs/day: 0.25    Years: 4.00    Pack years: 1.00    Types: Cigarettes    Quit date: 08/18/2018    Years since quitting: 3.0   Smokeless tobacco: Never   Tobacco comments:    patient quit 2 weeks ago. 09-01-18  Vaping Use   Vaping Use: Never used  Substance and Sexual Activity   Alcohol use: Never   Drug use: Never   Sexual activity: Yes  Other Topics Concern   Not on file  Social History Narrative   Not on file   Social Determinants of Health   Financial Resource Strain: Not on file  Food Insecurity: Not on file   Transportation Needs: Not on file  Physical Activity: Not on file  Stress: Not on file  Social Connections: Not on file      Family History  Problem Relation Age of Onset   Hypertension Mother    Hypertension Father    Diabetes Father    Diabetes Brother    Diabetes Maternal Grandmother    Stroke Maternal Grandmother    Diabetes Paternal Grandfather    Heart disease Neg Hx       ROS:   Please see the history of present illness.     All other systems reviewed and are negative.   Labs/EKG Reviewed:    EKG:   EKG is was not ordered today.    Recent Labs: 03/11/2021: Magnesium 1.6 06/27/2021: TSH 1.32 07/04/2021: ALT 17; BUN <5; Creatinine, Ser 0.67; Hemoglobin 10.0; Platelets 272; Potassium 3.6; Sodium 134   Recent Lipid Panel No results found for: CHOL, TRIG, HDL, CHOLHDL, LDLCALC, LDLDIRECT  Physical Exam:    VS:  BP 111/72   Pulse 74   Ht 5\' 9"  (1.753 m)   Wt 175 lb (79.4 kg)   BMI 25.84 kg/m     Wt Readings from Last 3 Encounters:  09/12/21 175 lb (79.4 kg)  08/27/21 171 lb (77.6 kg)  08/15/21 170 lb (77.1 kg)     GEN:  Well nourished, well developed in no acute distress HEENT: Normal NECK: No JVD; No carotid bruits LYMPHATICS: No lymphadenopathy CARDIAC: RRR, no murmurs, rubs, gallops RESPIRATORY:  Clear to auscultation without rales, wheezing or rhonchi  ABDOMEN: Soft, non-tender, non-distended MUSCULOSKELETAL:  No edema; No deformity  SKIN: Warm and dry NEUROLOGIC:  Alert and oriented x 3 PSYCHIATRIC:  Normal affect    Risk Assessment/Risk Calculators:                 ASSESSMENT & PLAN:    Postpartum hypertension  Her blood pressure has improved but now in the level 100s.  Her nifedipine was stopped and has been placed on propanolol 80 mg twice daily which  also will help with her migraine prevention.  I am concerned with the high dose of propanolol her systolic blood pressure is dropping.  And with her history of orthostatic hypotension  the best thing to do is to cut back on her propanolol.  She needs to stay on her propanolol for her migraine prevention so we will cut this back to 40 mg twice a day.  Hopefully this will be enough to prevent any episodes of her migraines.  Total time spent on the visit: 10 minutes  Follow-up in 12 weeks.  Patient Instructions  Medication Instructions:  Your physician has recommended you make the following change in your medication:  DECREASE: Propranolol 40 mg twice daily *If you need a refill on your cardiac medications before your next appointment, please call your pharmacy*   Lab Work: None If you have labs (blood work) drawn today and your tests are completely normal, you will receive your results only by: MyChart Message (if you have MyChart) OR A paper copy in the mail If you have any lab test that is abnormal or we need to change your treatment, we will call you to review the results.   Testing/Procedures: None   Follow-Up: At [facility], you and your health needs are our priority.  As part of our continuing mission to provide you with exceptional heart care, we have created designated Provider Care Teams.  These Care Teams include your primary Cardiologist (physician) and Advanced Practice Providers (APPs -  Physician Assistants and Nurse Practitioners) who all work together to provide you with the care you need, when you need it.  We recommend signing up for the patient portal called "MyChart".  Sign up information is provided on this After Visit Summary.  MyChart is used to connect with patients for Virtual Visits (Telemedicine).  Patients are able to view lab/test results, encounter notes, upcoming appointments, etc.  Non-urgent messages can be sent to your provider as well.   To learn more about what you can do with MyChart, go to [email or web address].    Your next appointment:   12 week(s)  The format for your next appointment:   In Person  Provider:    Thomasene Ripple, DO [id number], [place], [place] [address]    Other Instructions     Dispo:  No follow-ups on file.   Medication Adjustments/Labs and Tests Ordered: Current medicines are reviewed at length with the patient today.  Concerns regarding medicines are outlined above.  Tests Ordered: No orders of the defined types were placed in this encounter.  Medication Changes: No orders of the defined types were placed in this encounter.

## 2021-09-12 NOTE — Patient Instructions (Signed)
Medication Instructions:  Your physician has recommended you make the following change in your medication:  DECREASE: Propranolol 40 mg twice daily *If you need a refill on your cardiac medications before your next appointment, please call your pharmacy*   Lab Work: None If you have labs (blood work) drawn today and your tests are completely normal, you will receive your results only by: MyChart Message (if you have MyChart) OR A paper copy in the mail If you have any lab test that is abnormal or we need to change your treatment, we will call you to review the results.   Testing/Procedures: None   Follow-Up: At [facility], you and your health needs are our priority.  As part of our continuing mission to provide you with exceptional heart care, we have created designated Provider Care Teams.  These Care Teams include your primary Cardiologist (physician) and Advanced Practice Providers (APPs -  Physician Assistants and Nurse Practitioners) who all work together to provide you with the care you need, when you need it.  We recommend signing up for the patient portal called "MyChart".  Sign up information is provided on this After Visit Summary.  MyChart is used to connect with patients for Virtual Visits (Telemedicine).  Patients are able to view lab/test results, encounter notes, upcoming appointments, etc.  Non-urgent messages can be sent to your provider as well.   To learn more about what you can do with MyChart, go to [email or web address].    Your next appointment:   12 week(s)  The format for your next appointment:   In Person  Provider:   Thomasene Ripple, DO [address], [place], [place] [address]    Other Instructions

## 2021-09-18 ENCOUNTER — Telehealth: Payer: Self-pay | Admitting: Cardiology

## 2021-09-18 DIAGNOSIS — Z79899 Other long term (current) drug therapy: Secondary | ICD-10-CM

## 2021-09-18 NOTE — Telephone Encounter (Signed)
Received stat call from patient with complaint of sharp chest pain that started all of a sudden while she was in the car. Patient states that the pain started in the center of her chest and radiated to her back, she also had pain down her left arm starting at her elbow that went to her to fingers and reports it was very tingly along with her feet. Patient states that she was also short of breath during this episode and states that she felt like it was hard to take a deep breath. Patient also states that this episode lasted for 30-40 seconds. Patient reports that now she feels like her chest is heavy and also the center of her back, but states that she does feel better than when the episode occurred. Patient not at home at the moment and unable to provide blood pressure readings or pulse reading at this time.   Spoke with Dr. Servando Salina (DOD) who recommends patient check her blood pressure once home, and if systolic blood pressure greater than 130 she can increase her Propanolol back up to 80mg . Dr. also would like patient to come in for blood work tomorrow as well to check BMET & Mag. Dr. Servando Salina would also like patient to check her BP in the morning and send in MyChart message with readings and update on how she's feeling.   Made patient aware of all instructions, and also made patient aware of ED precautions should new or worsening symptoms develop. Orders for blood work have been placed. Patient aware of all instructions and verbalized understanding.   Will forward to MD

## 2021-09-18 NOTE — Telephone Encounter (Signed)
Pt c/o of Chest Pain: STAT if CP now or developed within 24 hours  1. Are you having CP right now? Hurts when she breathes fully  2. Are you experiencing any other symptoms (ex. SOB, nausea, vomiting, sweating)? Breathing shallowly due to pain happening when she breathes  3. How long have you been experiencing CP? Started today right before she called  4. Is your CP continuous or coming and going? Sharpe pain came and went   5. Have you taken Nitroglycerin? no   Patient states she started having a sharpe chest pain for about 40 seconds right before she called. She says now it hurts when she takes a full breath so she is breathing shallowly. She says her hand also tingled for a few minutes during the episode.     ?

## 2021-09-20 LAB — BASIC METABOLIC PANEL
BUN/Creatinine Ratio: 22 (ref 9–23)
BUN: 16 mg/dL (ref 6–20)
CO2: 18 mmol/L — ABNORMAL LOW (ref 20–29)
Calcium: 9.5 mg/dL (ref 8.7–10.2)
Chloride: 105 mmol/L (ref 96–106)
Creatinine, Ser: 0.72 mg/dL (ref 0.57–1.00)
Glucose: 67 mg/dL — ABNORMAL LOW (ref 70–99)
Potassium: 4.6 mmol/L (ref 3.5–5.2)
Sodium: 141 mmol/L (ref 134–144)
eGFR: 115 mL/min/{1.73_m2} (ref >59)

## 2021-09-20 LAB — MAGNESIUM: Magnesium: 1.8 mg/dL (ref 1.6–2.3)

## 2021-09-24 ENCOUNTER — Other Ambulatory Visit: Payer: Self-pay

## 2021-09-24 MED ORDER — IBUPROFEN 800 MG PO TABS: 800.0000 mg | ORAL_TABLET | Freq: Two times a day (BID) | ORAL | 3 refills | Status: DC

## 2021-09-24 NOTE — Telephone Encounter (Signed)
Refill sent to pharmacy.   

## 2021-10-03 ENCOUNTER — Ambulatory Visit: Payer: Medicaid Other | Admitting: Neurology

## 2021-11-05 ENCOUNTER — Ambulatory Visit (INDEPENDENT_AMBULATORY_CARE_PROVIDER_SITE_OTHER): Payer: Medicaid Other

## 2021-11-05 ENCOUNTER — Other Ambulatory Visit (HOSPITAL_BASED_OUTPATIENT_CLINIC_OR_DEPARTMENT_OTHER): Payer: Self-pay

## 2021-11-05 VITALS — BP 112/68 | HR 99 | Ht 69.0 in | Wt 174.0 lb

## 2021-11-05 DIAGNOSIS — Z3042 Encounter for surveillance of injectable contraceptive: Secondary | ICD-10-CM

## 2021-11-05 MED ORDER — MEDROXYPROGESTERONE ACETATE 150 MG/ML IM SUSP
150.0000 mg | Freq: Once | INTRAMUSCULAR | Status: AC
  Administered 2021-11-05: 150 mg via INTRAMUSCULAR

## 2021-11-05 MED ORDER — MEDROXYPROGESTERONE ACETATE 150 MG/ML IM SUSY
150.0000 mg | PREFILLED_SYRINGE | INTRAMUSCULAR | 3 refills | Status: DC
Start: 2021-11-05 — End: 2022-01-21
  Filled 2021-11-05: qty 1, 90d supply, fill #0

## 2021-11-05 NOTE — Progress Notes (Signed)
Patient presents for Depo Provera injection

## 2021-11-21 IMAGING — US US OB < 14 WEEKS - US OB TV
1 series · 15 of 28 positions shown · non-contrast
Comparison: None.

CLINICAL DATA: First trimester of pregnancy, left lower quadrant
abdominal pain.

EXAM:
OBSTETRIC <14 WK US AND TRANSVAGINAL OB US
TECHNIQUE: Both transabdominal and transvaginal ultrasound examinations were
performed for complete evaluation of the gestation as well as the
maternal uterus, adnexal regions, and pelvic cul-de-sac.
Transvaginal technique was performed to assess early pregnancy.

[Series 1: us ob < 14 weeks - us ob tv · 70 acquisitions, 15 frames shown]
[im 1/70]
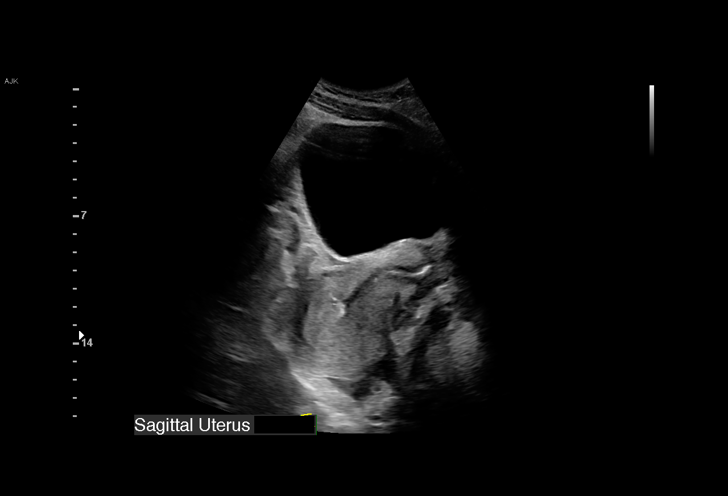
[im 6/70]
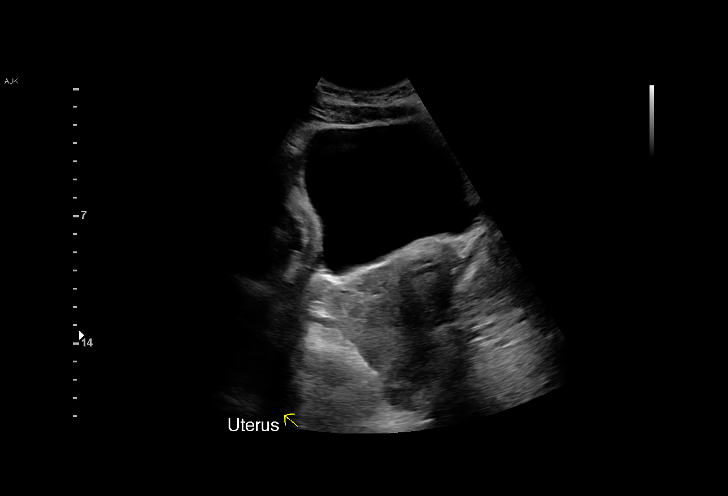
[im 11/70]
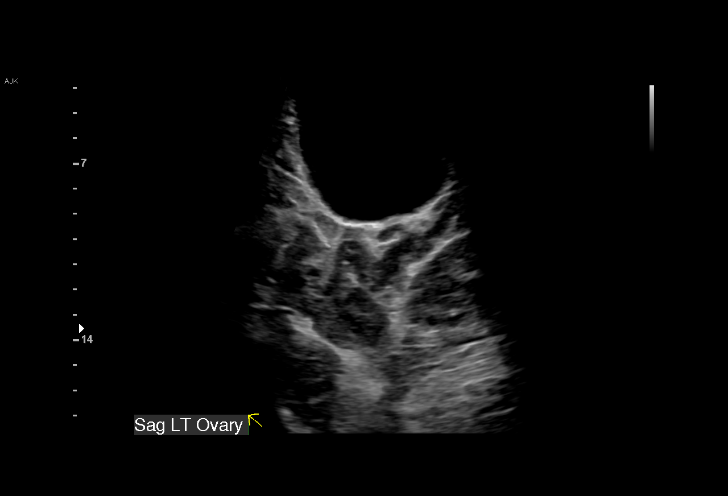
[im 16/70]
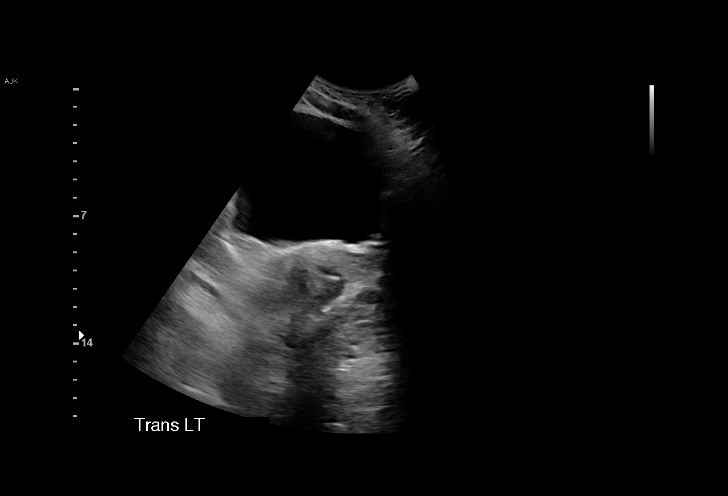
[im 21/70]
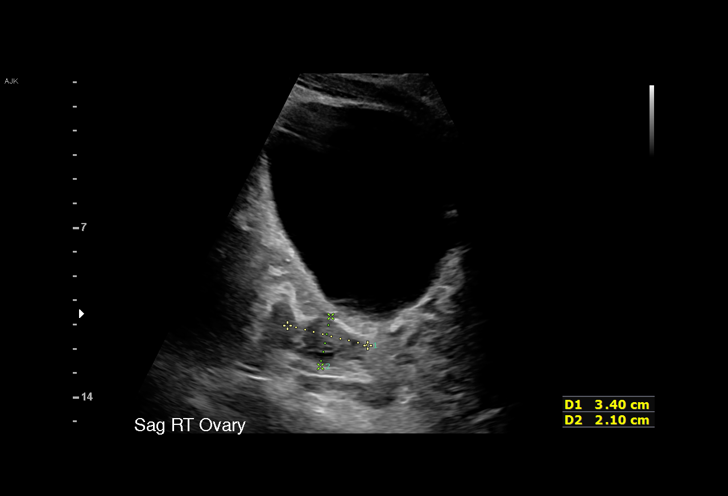
[im 26/70]
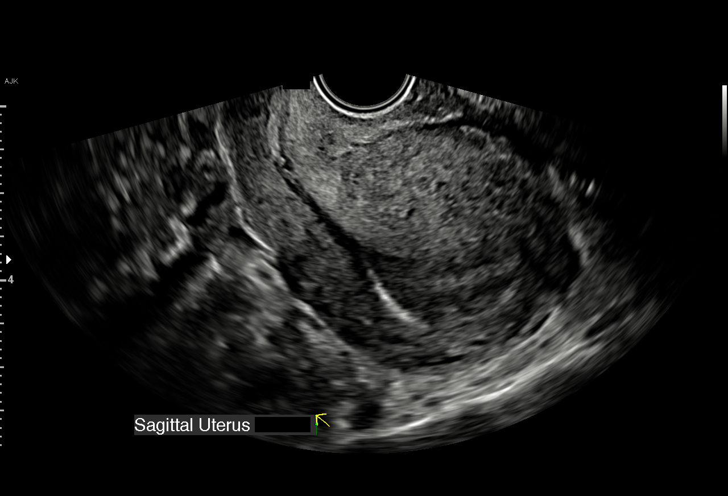
[im 31/70]
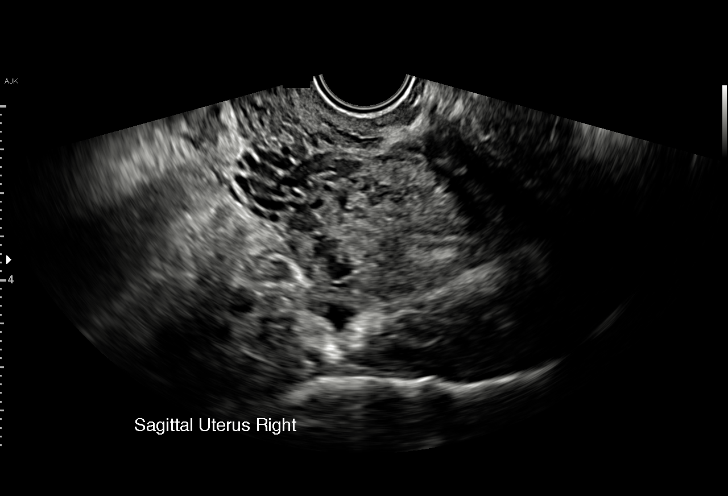
[im 36/70]
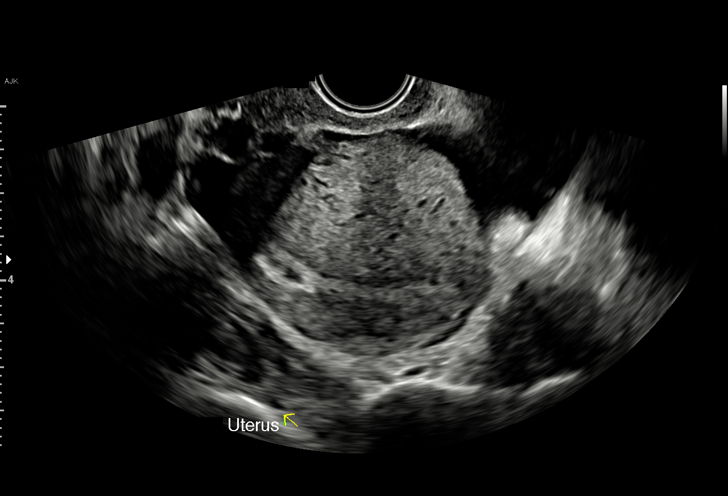
[im 39/70]
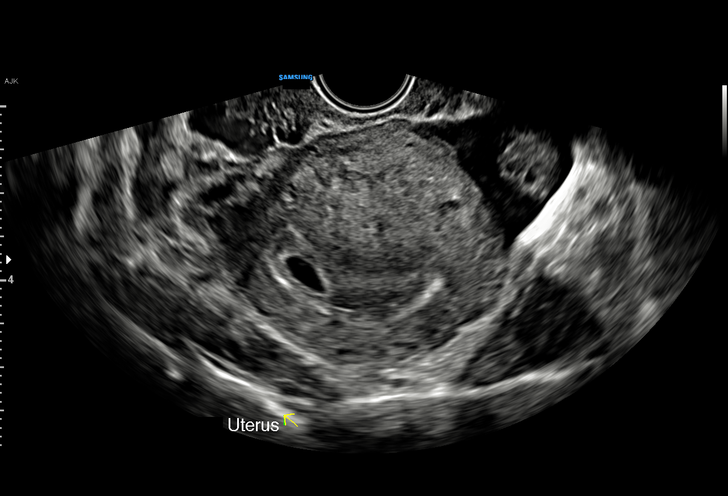
[im 44/70]
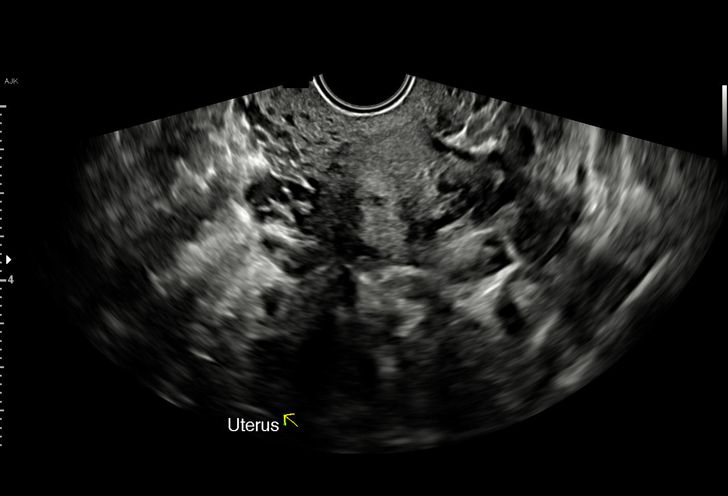
[im 49/70]
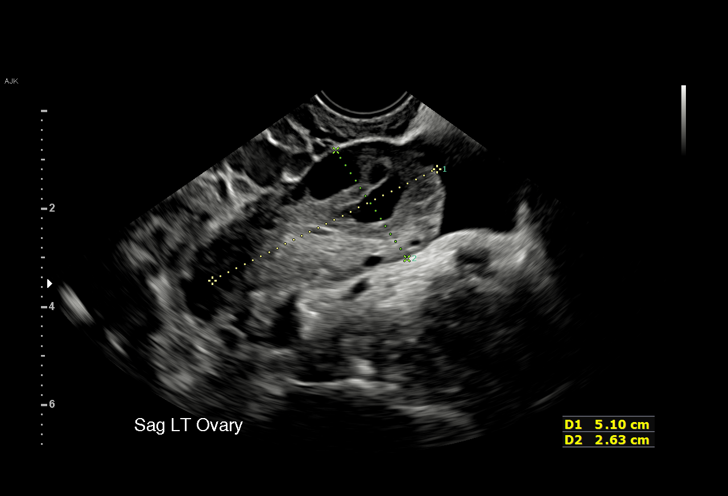
[im 54/70]
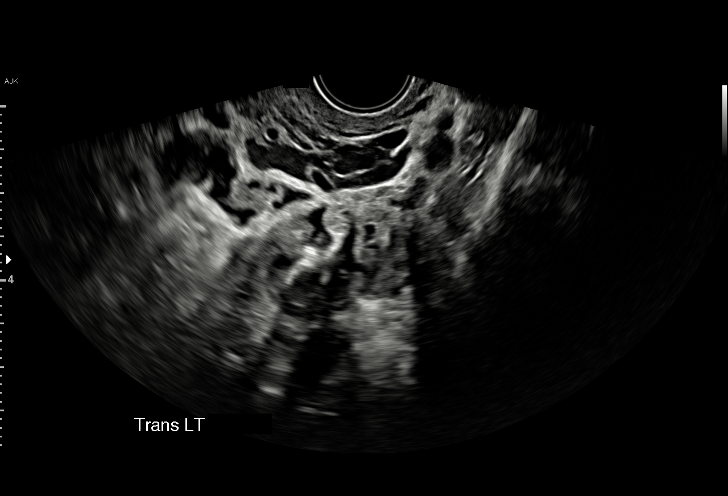
[im 59/70]
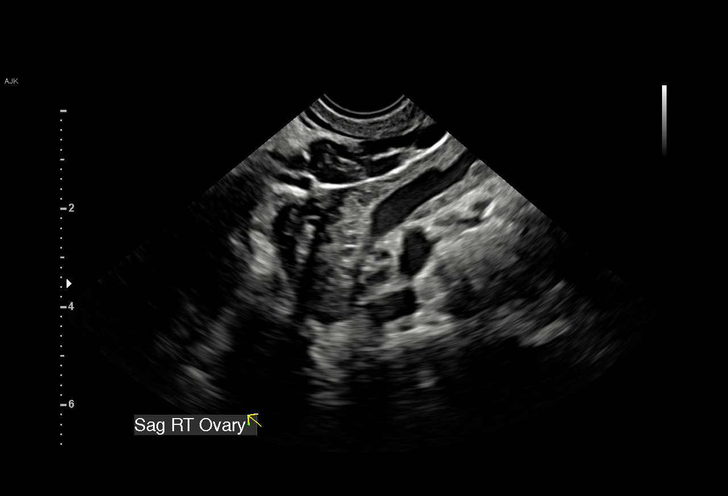
[im 64/70]
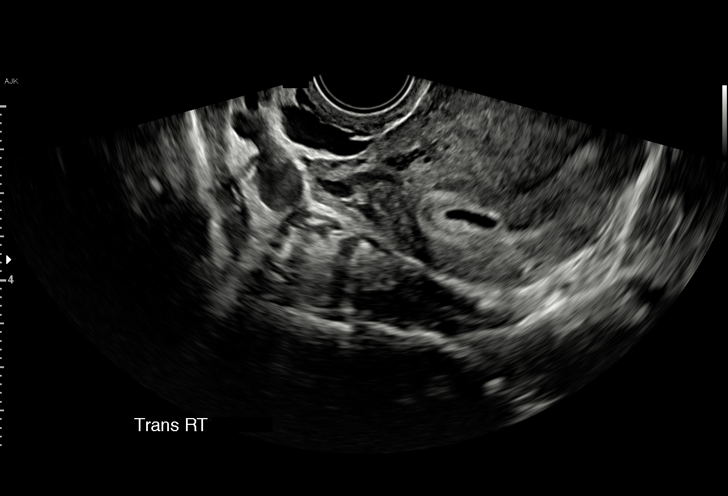
[im 70/70]
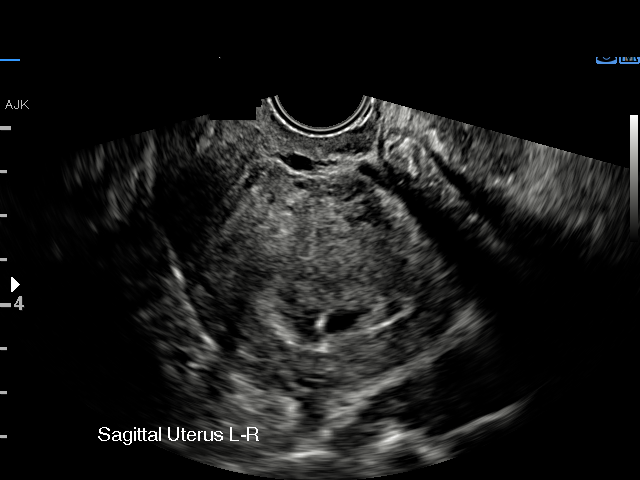

[15 of 28 positions shown; findings below may reference images not displayed]

FINDINGS: Intrauterine gestational sac: Single

Yolk sac:  Visualized.

Embryo:  Not Visualized.

Cardiac Activity: Not Visualized.

MSD: 8.8 mm   5 w   4 d

Subchorionic hemorrhage:  None visualized.

Maternal uterus/adnexae: Ovaries are unremarkable. Small amount of
free fluid is noted which most likely is physiologic.
IMPRESSION: Probable early intrauterine gestational sac with yolk sac, but no
fetal pole or cardiac activity yet visualized. Recommend follow-up
quantitative B-HCG levels and follow-up US in 14 days to assess
viability. This recommendation follows SRU consensus guidelines:
Diagnostic Criteria for Nonviable Pregnancy Early in the First
Trimester. N Engl J Med 0656; [DATE].

## 2021-12-04 NOTE — Progress Notes (Deleted)
° °  Virtual Visit via Video Note  I connected with Carla [name] on 12/04/21 at  8:15 AM EST by a video enabled telemedicine application and verified that I am speaking with the correct person using two identifiers.  Location: Patient: *** Provider: ***   I discussed the limitations of evaluation and management by telemedicine and the availability of in person appointments. The patient expressed understanding and agreed to proceed.  History of Present Illness: Carla [name] is a 31 year old female, seen in request by her primary care physician Dr. Willodean Rosenthal, for evaluation of chronic migraine headaches, initial evaluation was August 27, 2021.   I reviewed and summarized the referring note. PMHx. HTN   She reported history of chronic migraines since 31 years old, gradually increased frequency, during her most recent pregnancy in 2022, she had almost daily headaches, also complains of blurry vision, despite a new pair of glasses in summer of 2021.  Pending ophthalmology evaluation in October 2022   She was seen by headache specialist a few month ago, was put on Fioricet, she is taking daily, but despite that, she complains daily holoacranial retro-orbital area mild to moderate pressure headaches, only temporarily relieved by Fioricet  She denies lateralized motor or sensory deficit, had a history of chronic neck, upper back, low back pain, during the pregnancy, complains of intermittent shooting pain to bilateral lower extremity, improved after delivery,  Update December 05, 2021 SS:    Observations/Objective:   Assessment and Plan: 1.  Chronic migraine headache  Follow Up Instructions:    I discussed the assessment and treatment plan with the patient. The patient was provided an opportunity to ask questions and all were answered. The patient agreed with the plan and demonstrated an understanding of the instructions.   The patient was advised to call  back or seek an in-person evaluation if the symptoms worsen or if the condition fails to improve as anticipated.  I provided *** minutes of non-face-to-face time during this encounter.   Glean Salvo, NP

## 2021-12-05 ENCOUNTER — Encounter: Payer: Self-pay | Admitting: Neurology

## 2021-12-05 ENCOUNTER — Telehealth: Payer: Medicaid Other | Admitting: Neurology

## 2021-12-05 NOTE — Progress Notes (Signed)
Name: Carla [name]  MRN/ DOB: [id number], [birth year]    Age/ Sex: 31 y.o., female     PCP: Pcp, No   Reason for Endocrinology Evaluation: Hyperthyroidism     Initial Endocrinology Clinic Visit: 03/29/2019    PATIENT IDENTIFIER: Carla [name] is a 31 y.o., female with a past medical history of asthma, migraine headaches and hyperthyroidism during pregnancy. She has followed with [facility] since 03/28/2021 for consultative assistance with management of her hyperthyroidism.   HISTORICAL SUMMARY:   Pt was diagnosed with hyperthyroidism in 11/2020 with a suppressed TSH at  0.012 uIU/Ml , shortly after testing positive for pregnancy, these results were confirmed  In 12/2020 with elevated FT3 at 4.7 pg/mL but with normal FT4 at 1.66 ng/dL       Per pt she has been diagnosed with hyperthyroidism for ~ 7 yrs. No prior treatment  On her initial visit to our clinic she was [redacted] weeks pregnant with her third pregnancy.  TFTs were normal and no treatment was offered at the time.  She has a daughter born in 2011 and a son born in 2012 Has paternal and maternal grandmother history of thyroid disease    S/P NVD 06/2021. TSH was normal at the time of delivery with normal Free T4 but elevated T3 and total T4   SUBJECTIVE:     Today (12/06/2021):  Carla [name] is here for Hyperthyroidism.  She is S/P vaginal delivery 06/2021  She is nursing   Currently on Depo-provera   Weight has increased  Has occasional abdminal pain  Denies diarrhea  No palpitations  Denies local neck swelling , its is sore often      HISTORY:  Past Medical History:  Past Medical History:  Diagnosis Date   ADD (attention deficit disorder)    Anemia    Asthma    Bronchitis    Eczema    Heart palpitations    Hyperthyroidism    Insomnia    Migraine    Recurrent upper respiratory infection (URI)    Urticaria    Past Surgical History:  Past Surgical History:   Procedure Laterality Date   ADENOIDECTOMY     NASAL SEPTOPLASTY W/ TURBINOPLASTY  2017   SINOSCOPY     TONSILLECTOMY     VAGINA RECONSTRUCTION SURGERY N/A    Social History:  reports that she quit smoking about 3 years ago. Her smoking use included cigarettes. She has a 1.00 pack-year smoking history. She has never used smokeless tobacco. She reports that she does not drink alcohol and does not use drugs. Family History:  Family History  Problem Relation Age of Onset   Hypertension Mother    Hypertension Father    Diabetes Father    Diabetes Brother    Diabetes Maternal Grandmother    Stroke Maternal Grandmother    Diabetes Paternal Grandfather    Heart disease Neg Hx      HOME MEDICATIONS: Allergies as of 12/06/2021       Reactions   Latex Hives        Medication List        Accurate as of December 06, 2021  2:11 PM. If you have any questions, ask your nurse or doctor.          STOP taking these medications    cholecalciferol 25 MCG (1000 UNIT) tablet Commonly known as: VITAMIN D3 Stopped by: Scarlette Shorts, MD   ferrous sulfate 325 (65 FE) MG  tablet Commonly known as: FerrouSul Stopped by: Scarlette Shorts, MD   ibuprofen 800 MG tablet Commonly known as: ADVIL Stopped by: Scarlette Shorts, MD   NIFEdipine 20 MG capsule Commonly known as: PROCARDIA Stopped by: Scarlette Shorts, MD       TAKE these medications    albuterol 108 (90 Base) MCG/ACT inhaler Commonly known as: VENTOLIN HFA Inhale 2 puffs into the lungs every 6 (six) hours as needed for wheezing or shortness of breath.   Butalbital-APAP-Caffeine 50-325-40 MG capsule Take 1-2 capsules by mouth every 6 (six) hours as needed for headache.   medroxyPROGESTERone Acetate 150 MG/ML Susy Inject 1 mL (150 mg total) into the muscle every 3 (three) months.   medroxyPROGESTERone Acetate 150 MG/ML Susy Inject 1 mL (150 mg total) into the muscle every 3 (three) months.    prenatal multivitamin Tabs tablet Take 1 tablet by mouth daily at 12 noon.   propranolol 40 MG tablet Commonly known as: INDERAL Take 1 tablet (40 mg total) by mouth 2 (two) times daily.          OBJECTIVE:   PHYSICAL EXAM: VS: BP 120/76 (BP Location: Left Arm, Patient Position: Sitting, Cuff Size: Small)    Pulse 97    Ht 5\' 9"  (1.753 m)    Wt 177 lb (80.3 kg)    SpO2 98%    BMI 26.14 kg/m    EXAM: General: Pt appears well and is in NAD  Neck: General: Supple without adenopathy. Thyroid: Thyroid size normal.  Right thyroid lobe tender   Lungs: Clear with good BS bilat with no rales, rhonchi, or wheezes  Heart: Auscultation: RRR.  Abdomen: Gravid uterus , soft, nontender   Extremities: BL LE: No pretibial edema normal ROM and strength.     DATA REVIEWED:   Latest Reference Range & Units 12/06/21 09:56  TSH 0.35 - 5.50 uIU/mL 0.83  Triiodothyronine,Free,Serum 2.3 - 4.2 pg/mL 4.0  T4,Free(Direct) 0.60 - 1.60 ng/dL 12/08/21      ASSESSMENT / PLAN / RECOMMENDATIONS:   Hyperthyroidism during pregnancy   - Pt is clinically euthyroid - No local neck symptoms  - Repeat TFT's are normal     2. Thyroid tenderness :   - She has right lobe tenderness , will proceed with thyroid ultrasound  - Pt did have URI in the recent months but TFTs normal    F/U PRN    Signed electronically by: 8.84, MD  [name]  [facility] [address] [place] [place], [place] Phone: [phone] FAX: [phone]      CC: Pcp, No No address on file Phone: None  Fax: None   Return to Endocrinology clinic as below: Future Appointments  Date Time Provider Department [name]  12/18/2021  2:10 PM 12/20/2021, MD [facility] None  01/21/2022  8:30 AM [facility] NURSE [facility] None  02/19/2022  2:15 PM 02/21/2022, NP [facility] None

## 2021-12-06 ENCOUNTER — Ambulatory Visit: Payer: Medicaid Other | Admitting: Internal Medicine

## 2021-12-06 ENCOUNTER — Other Ambulatory Visit: Payer: Self-pay

## 2021-12-06 ENCOUNTER — Encounter: Payer: Self-pay | Admitting: Internal Medicine

## 2021-12-06 VITALS — BP 120/76 | HR 97 | Ht 69.0 in | Wt 177.0 lb

## 2021-12-06 DIAGNOSIS — E0590 Thyrotoxicosis, unspecified without thyrotoxic crisis or storm: Secondary | ICD-10-CM

## 2021-12-06 DIAGNOSIS — E0789 Other specified disorders of thyroid: Secondary | ICD-10-CM | POA: Diagnosis not present

## 2021-12-06 DIAGNOSIS — M542 Cervicalgia: Secondary | ICD-10-CM | POA: Insufficient documentation

## 2021-12-06 LAB — TSH: TSH: 0.83 u[IU]/mL (ref 0.35–5.50)

## 2021-12-06 LAB — T4, FREE: Free T4: 0.86 ng/dL (ref 0.60–1.60)

## 2021-12-06 LAB — T3, FREE: T3, Free: 4 pg/mL (ref 2.3–4.2)

## 2021-12-11 ENCOUNTER — Other Ambulatory Visit: Payer: Medicaid Other

## 2021-12-12 ENCOUNTER — Ambulatory Visit
Admission: RE | Admit: 2021-12-12 | Discharge: 2021-12-12 | Disposition: A | Discharge status: Home / Self Care | Payer: Medicaid Other | Source: Ambulatory Visit | Attending: Internal Medicine | Admitting: Internal Medicine

## 2021-12-12 DIAGNOSIS — E0789 Other specified disorders of thyroid: Secondary | ICD-10-CM

## 2021-12-17 NOTE — Progress Notes (Deleted)
° °  Virtual Visit via Video Note  I connected with Carla [name] on 12/17/21 at  3:00 PM EST by a video enabled telemedicine application and verified that I am speaking with the correct person using two identifiers.  Location: Patient: *** Provider: ***   I discussed the limitations of evaluation and management by telemedicine and the availability of in person appointments. The patient expressed understanding and agreed to proceed.  History of Present Illness: Carla [name] is a 31 year old female, seen in request by her primary care physician Dr. Willodean Rosenthal, for evaluation of chronic migraine headaches, initial evaluation was August 27, 2021.   I reviewed and summarized the referring note. PMHx. HTN   She reported history of chronic migraines since 31 years old, gradually increased frequency, during her most recent pregnancy in 2022, she had almost daily headaches, also complains of blurry vision, despite a new pair of glasses in summer of 2021.  Pending ophthalmology evaluation in October 2022   She was seen by headache specialist a few month ago, was put on Fioricet, she is taking daily, but despite that, she complains daily holoacranial retro-orbital area mild to moderate pressure headaches, only temporarily relieved by Fioricet  She denies lateralized motor or sensory deficit, had a history of chronic neck, upper back, low back pain, during the pregnancy, complains of intermittent shooting pain to bilateral lower extremity, improved after delivery,  Update December 18, 2021 SS:    Observations/Objective:   Assessment and Plan: 1.  Chronic migraine headache   Follow Up Instructions:    I discussed the assessment and treatment plan with the patient. The patient was provided an opportunity to ask questions and all were answered. The patient agreed with the plan and demonstrated an understanding of the instructions.   The patient was advised to call  back or seek an in-person evaluation if the symptoms worsen or if the condition fails to improve as anticipated.  I provided *** minutes of non-face-to-face time during this encounter.   Glean Salvo, NP

## 2021-12-18 ENCOUNTER — Ambulatory Visit: Payer: Medicaid Other | Admitting: Obstetrics & Gynecology

## 2021-12-18 ENCOUNTER — Encounter: Payer: Self-pay | Admitting: Obstetrics & Gynecology

## 2021-12-18 ENCOUNTER — Telehealth: Payer: Medicaid Other | Admitting: Neurology

## 2021-12-18 ENCOUNTER — Other Ambulatory Visit: Payer: Self-pay

## 2021-12-18 VITALS — BP 124/66 | HR 88 | Ht 69.0 in | Wt 182.0 lb

## 2021-12-18 DIAGNOSIS — R102 Pelvic and perineal pain: Secondary | ICD-10-CM

## 2021-12-18 NOTE — Progress Notes (Signed)
History:  31 y.o. [id number] here today for eval of occ pelvic pain. There are no sx currently. Pt denies dysuria. No known precipitating or alleviating factors  Pt is still nursing. Just wanted to make sure there was nothing serious going on.    The following portions of the patient's history were reviewed and updated as appropriate: allergies, current medications, past family history, past medical history, past social history, past surgical history and problem list.  Review of Systems:  Pertinent items are noted in HPI.    Objective:  Physical Exam Blood pressure 124/66, pulse 88, height 5\' 9"  (1.753 m), weight 182 lb (82.6 kg), currently breastfeeding.  CONSTITUTIONAL: Well-developed, well-nourished female in no acute distress.  HENT:  Normocephalic, atraumatic EYES: Conjunctivae and EOM are normal. No scleral icterus.  NECK: Normal range of motion SKIN: Skin is warm and dry. No rash noted. Not diaphoretic.No pallor. NEUROLGIC: Alert and oriented to person, place, and time. Normal coordination.  Abd: Soft, nontender and nondistended Pelvic: Normal appearing external genitalia; normal appearing vaginal mucosa and cervix.  Normal discharge.  Small uterus, no other palpable masses, no uterine or adnexal tenderness   Assessment & Plan:  Pelvic pain 6 mo PP. Normal exam.   Reassurance provided. Pt is to f/u if sx persist or worsen.   Total face-to-face time with patient, review of chart, discussion and coordination of care was .     Ande Therrell L. Harraway-Smith, M.D., 

## 2021-12-18 NOTE — Progress Notes (Signed)
Pt c/o occasional, sharp pelvic pain Pt denies UTI symptoms, vaginal discharge and vaginal odor Last pap- 12/24/20- LSIL

## 2022-01-08 ENCOUNTER — Telehealth: Payer: Self-pay | Admitting: Neurology

## 2022-01-08 NOTE — Telephone Encounter (Signed)
..   Pt understands that although there may be some limitations with this type of visit, we will take all precautions to reduce any security or privacy concerns.  Pt understands that this will be treated like an in office visit and we will file with pt's insurance, and there may be a patient responsible charge related to this service. ? ?

## 2022-01-21 ENCOUNTER — Other Ambulatory Visit: Payer: Self-pay

## 2022-01-21 ENCOUNTER — Ambulatory Visit: Payer: Medicaid Other

## 2022-01-21 ENCOUNTER — Ambulatory Visit (INDEPENDENT_AMBULATORY_CARE_PROVIDER_SITE_OTHER): Payer: Medicaid Other

## 2022-01-21 VITALS — BP 104/72 | HR 82 | Wt 181.0 lb

## 2022-01-21 DIAGNOSIS — Z3009 Encounter for other general counseling and advice on contraception: Secondary | ICD-10-CM

## 2022-01-21 DIAGNOSIS — Z30011 Encounter for initial prescription of contraceptive pills: Secondary | ICD-10-CM

## 2022-01-21 MED ORDER — NORETHINDRONE 0.35 MG PO TABS: 1.0000 | ORAL_TABLET | Freq: Every day | ORAL | 2 refills | Status: DC

## 2022-01-21 NOTE — Progress Notes (Signed)
GYNECOLOGY OFFICE VISIT NOTE: BIRTH CONTROL CONSULT  History:  Carla [name] is a 31 y.o. [phone] here today for birth control consult. She is currently on Depo-Provera and desires change due to weight gain.  She states she has gained more than 20 pounds since initiation in September.  She reports daily exercise in the form of jogging and toning with no improvement in weight loss.  She is also exclusively breast-feeding and pumping 1-2 times daily.  She expresses desire for minipill and states she has taken COC's in the past with no adverse effects.  She desires to have one more child in ~ 2 years.   Past Medical History:  Diagnosis Date   ADD (attention deficit disorder)    Anemia    Asthma    Bronchitis    Eczema    Heart palpitations    Hyperthyroidism    Insomnia    Migraine    Recurrent upper respiratory infection (URI)    Urticaria     Past Surgical History:  Procedure Laterality Date   ADENOIDECTOMY     NASAL SEPTOPLASTY W/ TURBINOPLASTY  2017   SINOSCOPY     TONSILLECTOMY     VAGINA RECONSTRUCTION SURGERY N/A     The following portions of the patient's history were reviewed and updated as appropriate: allergies, current medications, past family history, past medical history, past social history, past surgical history and problem list.   Health Maintenance:  LGSIL pap and negative HRHPV on Jan 2022.  No mammogram on file d/t age.   Review of Systems:  Genito-Urinary ROS: no dysuria, trouble voiding, or hematuria Gastrointestinal ROS: no abdominal pain, change in bowel habits, or black or bloody stools Objective:  Vitals: BP 104/72    Pulse 82    Wt 181 lb (82.1 kg)    BMI 26.73 kg/m   Physical Exam: Physical Exam Constitutional:      Appearance: Normal appearance.  HENT:     Head: Normocephalic and atraumatic.  Eyes:     Conjunctiva/sclera: Conjunctivae normal.  Cardiovascular:     Rate and Rhythm: Normal rate.  Pulmonary:     Effort: Pulmonary  effort is normal. No respiratory distress.  Musculoskeletal:        General: Normal range of motion.     Cervical back: Normal range of motion.  Neurological:     Mental Status: She is alert and oriented to person, place, and time.  Psychiatric:        Mood and Affect: Mood normal.        Behavior: Behavior normal.        Thought Content: Thought content normal.  Vitals reviewed.     Labs and Imaging: No results found.  Assessment & Plan:  31 year old Birth Control Modification Breastfeeding  -Reviewed potential side effects and administration of POPs. -Discussed differences between POPs and COCs.  Reviewed timing of dosing as well as potential for pregnancy with irregular usage. -Reassured that since milk supply sounds adequate could proceed with COC usage while breastfeeding.  Given option to change to POP if issues arise.  -Informed of non-hormonal IUD option including benefits and potential side effects. -Discussed removal for desired pregnancy in 1-2 years. -Encouraged to review bedsider.org for further information and will also give Paragard pamphlet. -Patient expresses strong consideration. -During interim, patient desiring to proceed with mini-pill usage.  -RTO as desired for IUD insertion.   Total face-to-face time with patient:  20  minutes   [name],  Steve Rattler 01/21/2022 8:48 AM

## 2022-01-22 ENCOUNTER — Ambulatory Visit: Payer: Medicaid Other | Admitting: Obstetrics & Gynecology

## 2022-01-22 ENCOUNTER — Other Ambulatory Visit (HOSPITAL_COMMUNITY)
Admission: RE | Admit: 2022-01-22 | Discharge: 2022-01-22 | Disposition: A | Discharge status: Home / Self Care | Payer: Medicaid Other | Source: Ambulatory Visit | Attending: Family Medicine | Admitting: Family Medicine

## 2022-01-22 ENCOUNTER — Encounter: Payer: Self-pay | Admitting: Obstetrics & Gynecology

## 2022-01-22 VITALS — BP 114/74 | HR 85 | Wt 181.0 lb

## 2022-01-22 DIAGNOSIS — R87612 Low grade squamous intraepithelial lesion on cytologic smear of cervix (LGSIL): Secondary | ICD-10-CM

## 2022-01-22 DIAGNOSIS — Z30430 Encounter for insertion of intrauterine contraceptive device: Secondary | ICD-10-CM | POA: Diagnosis not present

## 2022-01-22 MED ORDER — PARAGARD INTRAUTERINE COPPER IU IUD
1.0000 | INTRAUTERINE_SYSTEM | Freq: Once | INTRAUTERINE | Status: AC
  Administered 2022-01-22: 1 via INTRAUTERINE

## 2022-01-22 NOTE — Progress Notes (Signed)
Patient switching from depo provera  to an paraguard IUD. Kathrene Alu RN  ?

## 2022-01-22 NOTE — Addendum Note (Signed)
Addended by: Anell Barr on: 01/22/2022 02:09 PM ? ? Modules accepted: Orders ? ?

## 2022-01-22 NOTE — Patient Instructions (Signed)
Intrauterine Device Insertion, Care After This sheet gives you information about how to care for yourself after your procedure. Your health care provider may also give you more specific instructions. If you have problems or questions, contact your health care provider. What can I expect after the procedure? After the procedure, it is common to have: Cramps and pain in the abdomen. Bleeding. It may be light or heavy. This may last for a few days. Lower back pain. Dizziness. Headaches. Nausea. Follow these instructions at home:  Before resuming sexual activity, check to make sure that you can feel the IUD string or strings. You should be able to feel the end of the string below the opening of your cervix. If your IUD string is in place, you may resume sexual activity. If you had a hormonal IUD inserted more than 7 days after your most recent period started, you will need to use a backup method of birth control for 7 days after IUD insertion. Ask your health care provider whether this applies to you. Continue to check that the IUD is still in place by feeling for the strings after every menstrual period, or once a month. An IUD will not protect you from sexually transmitted infections (STIs). Use methods to prevent the exchange of body fluids between partners (barrier protection) every time you have sex. Barrier protection can be used during oral, vaginal, or anal sex. Commonly used barrier methods include: Female condom. Female condom. Dental dam. Take over-the-counter and prescription medicines only as told by your health care provider. Keep all follow-up visits as told by your health care provider. This is important. Contact a health care provider if: You feel light-headed or weak. You have any of the following problems with your IUD string or strings: The string bothers or hurts you or your sexual partner. You cannot feel the string. The string has gotten longer. You can feel the IUD in  your vagina. You think you may be pregnant, or you miss your menstrual period. You think you may have a sexually transmitted infection (STI). Get help right away if: You have flu-like symptoms, such as tiredness (fatigue) and muscle aches. You have a fever and chills. You have bleeding that is heavier or lasts longer than a normal menstrual cycle. You have abnormal or bad-smelling discharge from your vagina. You develop abdominal pain that is new, is getting worse, or is not in the same area of earlier cramping and pain. You have pain during sexual activity. Summary After the procedure, it is common to have cramps and pain in the abdomen. It is also common to have light bleeding or heavier bleeding that is like your menstrual period. Continue to check that the IUD is still in place by feeling for the strings after every menstrual period, or once a month. Keep all follow-up visits as told by your health care provider. This is important. Contact your health care provider if you have problems with your IUD strings, such as the string getting longer or bothering you or your sexual partner. This information is not intended to replace advice given to you by your health care provider. Make sure you discuss any questions you have with your health care provider. Document Revised: 11/01/2019 Document Reviewed: 11/01/2019 Elsevier Patient Education  2022 Elsevier Inc.  

## 2022-01-22 NOTE — Progress Notes (Signed)
GYNECOLOGY CLINIC PROCEDURE NOTE ? ?Carla [name] is a 31 y.o. [phone] here for Mirena IUD insertion. No GYN concerns.  Last pap smear was on 12/24/2020 and showed LGSIL. Needs repeat PAP today. ? ?Needs a repeat PAP.  ?  ?12/24/2020 Negative    ?Neisseria Gonorrhea Negative   ?High risk HPV Negative   ?Adequacy Satisfactory for evaluation; transformation zone component PRESENT.   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 ?IUD Insertion Procedure Note ?Patient identified, informed consent performed.  Discussed risks of irregular bleeding, cramping, infection, malpositioning or misplacement of the IUD outside the uterus which may require further procedures. Time out was performed.  Urine pregnancy test negative. ? ?Speculum placed in the vagina.  Cervix visualized.  Cleaned with Betadine x 2.  Grasped anteriorly with a single tooth tenaculum.  Uterus sounded to 3 cm.  Liletta IUD placed per manufacturer's recommendations.  Strings trimmed to 3 cm. Tenaculum was removed, good hemostasis noted.  Patient tolerated procedure well.  ? ?Patient was given post-procedure instructions.  Patient was asked to follow up in 4 weeks for IUD check. ? ?Cattie Tineo L. Harraway-Smith, M.D., FACOG ? ?  ?

## 2022-01-27 LAB — CYTOLOGY - PAP
Comment: NEGATIVE
Diagnosis: UNDETERMINED — AB
High risk HPV: NEGATIVE

## 2022-01-28 ENCOUNTER — Telehealth: Payer: Self-pay

## 2022-01-28 NOTE — Telephone Encounter (Signed)
Called pt to discuss abnormal pap smear. Pt made aware that her PAP  is negative for HPV and positive ASCUS. Pt made aware that she will need to repeat her PAP in 1 year. Understanding was voiced. ?Tamara Monteith l Amilah Greenspan, CMA  ?

## 2022-01-30 ENCOUNTER — Telehealth: Payer: Self-pay

## 2022-01-30 NOTE — Telephone Encounter (Signed)
-----   Message from Willodean Rosenthal, MD sent at 01/29/2022  4:50 PM EST ----- ?Please call pt. She needs a repeat PAP in 1 year.  +ASCUS neg hrHPV.  ? ?Thanks  ? ?Clh-s ?

## 2022-01-30 NOTE — Telephone Encounter (Signed)
Left message for patient to return call to office about pap smear results. Armandina Stammer RN  ?

## 2022-02-06 ENCOUNTER — Ambulatory Visit: Payer: Medicaid Other | Admitting: Family Medicine

## 2022-02-17 ENCOUNTER — Ambulatory Visit: Payer: Medicaid Other | Admitting: Family Medicine

## 2022-02-19 ENCOUNTER — Telehealth: Payer: Medicaid Other | Admitting: Neurology

## 2022-02-19 ENCOUNTER — Ambulatory Visit: Payer: Medicaid Other | Admitting: Obstetrics & Gynecology

## 2022-02-19 ENCOUNTER — Encounter: Payer: Self-pay | Admitting: Obstetrics & Gynecology

## 2022-02-19 VITALS — BP 119/72 | HR 88 | Wt 183.0 lb

## 2022-02-19 DIAGNOSIS — Z789 Other specified health status: Secondary | ICD-10-CM | POA: Diagnosis not present

## 2022-02-19 DIAGNOSIS — Z30431 Encounter for routine checking of intrauterine contraceptive device: Secondary | ICD-10-CM | POA: Diagnosis not present

## 2022-02-19 NOTE — Progress Notes (Signed)
Patient complaining of weight gain. Armandina Stammer RN  ?

## 2022-02-19 NOTE — Progress Notes (Signed)
?  GYNECOLOGY OFFICE ENCOUNTER NOTE ? ?History:  ?31 y.o. [id number] here today for today for IUD string check; Liletta  IUD was placed  01/22/2022. No complaints about the IUD, no concerning side effects.  Pt reports weight gain PP. Weighs more than she did in any other time not pregnant.  ? ?The following portions of the patient's history were reviewed and updated as appropriate: allergies, current medications, past family history, past medical history, past social history, past surgical history and problem list. Last pap smear on 01/22/2022 was ASCUS neg hrHPV normal, negative HRHPV. ? ?Review of Systems:  ?Pertinent items are noted in HPI. ?  ?Objective:  ?Physical Exam ?Blood pressure 119/72, pulse 88, weight 183 lb (83 kg), last menstrual period 02/15/2022, currently breastfeeding. ?CONSTITUTIONAL: Well-developed, well-nourished female in no acute distress.  ?HENT:  Normocephalic, atraumatic. External right and left ear normal. Oropharynx is clear and moist ?EYES: Conjunctivae and EOM are normal. Pupils are equal, round, and reactive to light. No scleral icterus.  ?NECK: Normal range of motion, supple, no masses ?CARDIOVASCULAR: Normal heart rate noted ?RESPIRATORY: Effort and breath sounds normal, no problems with respiration noted ?ABDOMEN: Soft, no distention noted.   ?PELVIC: Normal appearing external genitalia; normal appearing vaginal mucosa and cervix.  IUD strings visualized, about 3 cm in length outside cervix.  ? ?Assessment & Plan:  ?Patient to keep IUD in place for up to seven years; can come in for removal if she desires pregnancy earlier or for any concerning side effects. ?F/u in 1 year or sooner prn  ? ? ?Jendaya Gossett L. Erin Fulling, MD, FACOG ?Obstetrician Heritage manager, Faculty Practice ?[facility] for [employer], [facility] ?  ? ?

## 2022-05-06 NOTE — Progress Notes (Unsigned)
   Virtual Visit via Video Note  I connected with Carla [name] on 05/06/22 at  8:45 AM EDT by a video enabled telemedicine application and verified that I am speaking with the correct person using two identifiers.  Location: Patient: at her home Provider: in the office    I discussed the limitations of evaluation and management by telemedicine and the availability of in person appointments. The patient expressed understanding and agreed to proceed.  History of Present Illness: Carla [name] is a 31 year old female, seen in request by her primary care physician Dr. Willodean Rosenthal, for evaluation of chronic migraine headaches, initial evaluation was August 27, 2021.   I reviewed and summarized the referring note. PMHx. HTN   She reported history of chronic migraines since 31 years old, gradually increased frequency, during her most recent pregnancy in 2022, she had almost daily headaches, also complains of blurry vision, despite a new pair of glasses in summer of 2021.  Pending ophthalmology evaluation in October 2022   She was seen by headache specialist a few month ago, was put on Fioricet, she is taking daily, but despite that, she complains daily holoacranial retro-orbital area mild to moderate pressure headaches, only temporarily relieved by Fioricet  She denies lateralized motor or sensory deficit, had a history of chronic neck, upper back, low back pain, during the pregnancy, complains of intermittent shooting pain to bilateral lower extremity, improved after delivery  Update 05/07/2022 SS: here for VV, still headaches daily, are improved in severity. Are temporally, occipitally, feels pressure pain/squeezing pain, has been for several years. Had baby 07/04/21, little girl, still breast feeding. Dr. Terrace Arabia had started Inderal 80 mg BID, cardiology reduced to 40 mg BID due to low BP. Recent BP 137/65. Did see eye doctor, records not available to me. Headaches are  more the back of head, radiating down the neck. Rarely muffled hearing. Takes ibuprofen 800 mg BID for headaches, also for inflammation around heart. Takes Flexeril for neck pain.   Observations/Objective: Via virtual visit, is alert and oriented, speech is clear and concise, facial symmetry noted, moves upper extremities well, excellent historian  Assessment and Plan: 1.  Chronic migraine headache 2.  Postpartum hypertension  -Continued daily headache -I am interested in recent eye exam, will obtain records from [facility], any report of papilledema? -Still breast feeding so limited in medication options, right now isn't interested in making any changes, continue Inderal 40 mg BID, for BP/migraines, we could consider nortriptyline for further management of migraines  -Consider component of rebound headaches with daily ibuprofen use  Follow Up Instructions: Will update once eye exam is obtained    I discussed the assessment and treatment plan with the patient. The patient was provided an opportunity to ask questions and all were answered. The patient agreed with the plan and demonstrated an understanding of the instructions.   The patient was advised to call back or seek an in-person evaluation if the symptoms worsen or if the condition fails to improve as anticipated.  Otila Kluver, DNP  [facility] [address], [address] [place], [place] [address] [phone]

## 2022-05-07 ENCOUNTER — Telehealth (INDEPENDENT_AMBULATORY_CARE_PROVIDER_SITE_OTHER): Payer: Medicaid Other | Admitting: Neurology

## 2022-05-07 ENCOUNTER — Telehealth: Payer: Self-pay | Admitting: Neurology

## 2022-05-07 ENCOUNTER — Encounter: Payer: Self-pay | Admitting: Neurology

## 2022-05-07 DIAGNOSIS — G43709 Chronic migraine without aura, not intractable, without status migrainosus: Secondary | ICD-10-CM | POA: Diagnosis not present

## 2022-05-07 NOTE — Telephone Encounter (Signed)
I have called [facility]  [phone] and requested most recent eye exam notes to be sent to our office. Fax [phone] has been provided.

## 2022-05-07 NOTE — Telephone Encounter (Signed)
Carla [name], could you please try to obtain most recent records from eye exam for my review:  [facility] [place]  [phone]  Thank you!

## 2022-05-08 ENCOUNTER — Encounter: Payer: Self-pay | Admitting: Neurology

## 2022-05-08 DIAGNOSIS — G43709 Chronic migraine without aura, not intractable, without status migrainosus: Secondary | ICD-10-CM

## 2022-05-08 NOTE — Telephone Encounter (Signed)
Referral placed to ophthalmology, Dr. Dione Booze, report of vision changes, constant headache, r/o IIH.  Orders Placed This Encounter  Procedures   Ambulatory referral to Ophthalmology    Referral Priority:   Routine    Referral Type:   Consultation    Referral Reason:   Specialty Services Required    Requested Specialty:   Ophthalmology    Number of Visits Requested:   1

## 2022-05-08 NOTE — Telephone Encounter (Signed)
I received records from [facility] 09/23/21, reports normal ocular health, the posterior segment of the eye is reported as pink, healthy, distinct bilaterally. Given the chronic nature of her headaches, I think it would be a good idea to see an ophthalmologist, Dr. Dione Booze for full evaluation of her eyes, to rule out papilledema. I sent her a my chart message.

## 2022-05-08 NOTE — Telephone Encounter (Signed)
Noted  

## 2022-05-08 NOTE — Telephone Encounter (Signed)
Information from eye doctor received and placed on NP's desk for review.

## 2022-05-12 ENCOUNTER — Telehealth: Payer: Self-pay | Admitting: Neurology

## 2022-05-12 NOTE — Telephone Encounter (Signed)
Referral for Ophthalmology sent to [facility] [phone]. 

## 2022-06-04 ENCOUNTER — Encounter: Payer: Self-pay | Admitting: General Practice

## 2022-06-04 ENCOUNTER — Ambulatory Visit: Payer: Medicaid Other | Admitting: Obstetrics & Gynecology

## 2022-06-04 ENCOUNTER — Encounter: Payer: Self-pay | Admitting: Obstetrics & Gynecology

## 2022-06-04 VITALS — BP 120/63 | HR 81 | Wt 177.0 lb

## 2022-06-04 DIAGNOSIS — Z30432 Encounter for removal of intrauterine contraceptive device: Secondary | ICD-10-CM

## 2022-06-04 DIAGNOSIS — Z3009 Encounter for other general counseling and advice on contraception: Secondary | ICD-10-CM

## 2022-06-04 MED ORDER — NORETHINDRONE 0.35 MG PO TABS: 1.0000 | ORAL_TABLET | Freq: Every day | ORAL | 4 refills | Status: DC

## 2022-06-04 NOTE — Progress Notes (Signed)
History:  31 y.o. [id number] here today for removal of Copper T IUD due to heavy bleeding. Still nursing. She reports that her bleeding is heavy and she just wants it removed.   Still wants contraception management.    The following portions of the patient's history were reviewed and updated as appropriate: allergies, current medications, past family history, past medical history, past social history, past surgical history and problem list.  Review of Systems:  Pertinent items are noted in HPI.    Objective:  Physical Exam Blood pressure 120/63, pulse 81, weight 177 lb (80.3 kg), currently breastfeeding.  CONSTITUTIONAL: Well-developed, well-nourished female in no acute distress.  HENT:  Normocephalic, atraumatic EYES: Conjunctivae and EOM are normal. No scleral icterus.  NECK: Normal range of motion SKIN: Skin is warm and dry. No rash noted. Not diaphoretic.No pallor. NEUROLGIC: Alert and oriented to person, place, and time. Normal coordination.    GYN procedure: Patient was in the dorsal lithotomy position, normal external genitalia was noted.  A speculum was placed in the patient's vagina, normal discharge was noted, no lesions. The multiparous cervix was visualized, no lesions, no abnormal discharge;  and the cervix was swabbed with Betadine using scopettes. The strings of the IUD were grasped and pulled using ring forceps.  The IUD was successfully removed in its entirety. Patient tolerated the procedure well.     Assessment & Plan:  Diagnoses and all orders for this visit:  Encounter for counseling regarding contraception -     norethindrone (MICRONOR) 0.35 MG tablet; Take 1 tablet (0.35 mg total) by mouth daily.  Encounter for IUD removal -     norethindrone (MICRONOR) 0.35 MG tablet; Take 1 tablet (0.35 mg total) by mouth daily.   Million Maharaj L. Harraway-Smith, M.D., [place] [place]

## 2022-06-17 ENCOUNTER — Telehealth: Payer: Self-pay | Admitting: Neurology

## 2022-06-17 NOTE — Telephone Encounter (Addendum)
I called patient to discuss. No answer, left a message asking her to call us back. Please route to POD 2 if patient calls back.

## 2022-06-17 NOTE — Telephone Encounter (Signed)
I called patient. I discussed this with her. She is agreeable to coming in for an appointment on 06/19/22 at 1:45pm to discuss this further with Maralyn Sago, NP.

## 2022-06-17 NOTE — Telephone Encounter (Signed)
Pt returned phone call, would like a call back.  

## 2022-06-17 NOTE — Telephone Encounter (Signed)
I received records from Dr. Dione Booze from office visit 06/10/2022, visual field full, no enlarged blind spot.  OCT normal, no disc edema.  Does report transient obscurations of vision if she changes positions or hears swishing with heartbeat.  With exercise she gets lightheaded and feels faint.  Has gained around 52 pounds.  Recently on steroids for inflammation around her heart and Lasix for fluid retention.  Nasal disc margins are slightly blurry, there are no spontaneous venous pulsations.  Could be early signs of low-grade papilledema.  She has been started on birth control pills, plan to repeat testing in 6 months.  Please call the patient, I would recommend have her come in for office visit, I would like to discuss pursuing lumbar puncture for evaluation of potential IIH.

## 2022-06-18 ENCOUNTER — Encounter: Payer: Self-pay | Admitting: Obstetrics & Gynecology

## 2022-06-19 ENCOUNTER — Encounter: Payer: Self-pay | Admitting: Cardiology

## 2022-06-19 ENCOUNTER — Ambulatory Visit: Payer: Medicaid Other | Admitting: Cardiology

## 2022-06-19 ENCOUNTER — Ambulatory Visit (INDEPENDENT_AMBULATORY_CARE_PROVIDER_SITE_OTHER): Payer: Medicaid Other

## 2022-06-19 ENCOUNTER — Ambulatory Visit: Payer: Medicaid Other | Admitting: Neurology

## 2022-06-19 ENCOUNTER — Encounter: Payer: Self-pay | Admitting: Neurology

## 2022-06-19 VITALS — BP 112/80 | HR 87 | Ht 69.0 in | Wt 176.0 lb

## 2022-06-19 VITALS — BP 114/70 | HR 84 | Ht 69.0 in | Wt 176.0 lb

## 2022-06-19 DIAGNOSIS — G43709 Chronic migraine without aura, not intractable, without status migrainosus: Secondary | ICD-10-CM

## 2022-06-19 DIAGNOSIS — R0602 Shortness of breath: Secondary | ICD-10-CM | POA: Insufficient documentation

## 2022-06-19 DIAGNOSIS — M7989 Other specified soft tissue disorders: Secondary | ICD-10-CM | POA: Diagnosis not present

## 2022-06-19 DIAGNOSIS — R002 Palpitations: Secondary | ICD-10-CM

## 2022-06-19 NOTE — Progress Notes (Signed)
Patient: Carla [name] Date of Birth: [birth year]  Reason for Visit: Follow up History from: Patient, children Primary Neurologist: Dr. Terrace Arabia   ASSESSMENT AND PLAN 31 y.o. 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, ophthalmology evaluation showed nasal disc margins slightly blurry, no spontaneous venous pulsations  -Check MRI of the brain with and without contrast, if unremarkable consider proceeding with LP to determine opening pressure -Continues breast-feeding, limited on medication options, she is on propranolol from cardiology; with upcoming MRI, we discussed discarding breast milk for the 1st 24 hours, but will discuss when MRI is performed see below:  From [employer]: However, guidelines developed by several [employer] state that breastfeeding need not be disrupted after a nursing mother receives a gadolinium-containing contrast medium.[1-3] Other agents may be preferred, especially while nursing a newborn or preterm infant.  HISTORY Carla [name] is a 31 year old female, seen in request by her primary care physician Dr. Willodean Rosenthal, for evaluation of chronic migraine headaches, initial evaluation was August 27, 2021.   I reviewed and summarized the referring note. PMHx. HTN   She reported history of chronic migraines since 31 years old, gradually increased frequency, during her most recent pregnancy in 2022, she had almost daily headaches, also complains of blurry vision, despite a new pair of glasses in summer of 2021.  Pending ophthalmology evaluation in October 2022   She was seen by headache specialist a few month ago, was put on Fioricet, she is taking daily, but despite that, she complains daily holoacranial retro-orbital area mild to moderate pressure headaches, only temporarily relieved by Fioricet  She denies lateralized motor or sensory deficit,  had a history of chronic neck, upper back, low back pain, during the pregnancy, complains of intermittent shooting pain to bilateral lower extremity, improved after delivery   Update 05/07/2022 SS: here for VV, still headaches daily, are improved in severity. Are temporally, occipitally, feels pressure pain/squeezing pain, has been for several years. Had baby 07/04/21, little girl, still breast feeding. Dr. Terrace Arabia had started Inderal 80 mg BID, cardiology reduced to 40 mg BID due to low BP. Recent BP 137/65. Did see eye doctor, records not available to me. Headaches are more the back of head, radiating down the neck. Rarely muffled hearing. Takes ibuprofen 800 mg BID for headaches, also for inflammation around heart. Takes Flexeril for neck pain.  Update 06/19/2022 SS: Saw Dr. 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 VF were full.  Continues with daily headache, frontal, retro-orbital, occipital.  Worsens with exercise, has ringing in the ears, swooshing.  Sensitive to light, sees sparkles.  Difficulty with nighttime driving.  Is almost 1 year postpartum.  Her headache presentation worsened about 2 years ago.  Recently started on progesterone birth control.  Here today with her 3 kids.  She is breast-feeding. Wearing Zio patch today.   REVIEW OF SYSTEMS: Out of a complete 14 system review of symptoms, the patient complains only of the following symptoms, and all other reviewed systems are negative.  See HPI  ALLERGIES: Allergies  Allergen Reactions   Latex Hives    HOME MEDICATIONS: Outpatient Medications Prior to Visit  Medication Sig Dispense Refill   albuterol (PROVENTIL HFA;VENTOLIN HFA) 108 (90 Base) MCG/ACT inhaler Inhale 2 puffs into the lungs every 6 (six) hours as needed for wheezing or shortness of  breath.     Butalbital-APAP-Caffeine 50-325-40 MG capsule Take 1-2 capsules by  mouth every 6 (six) hours as needed for headache. 30 capsule 3   citalopram (CELEXA) 20 MG tablet Take 20 mg by mouth at bedtime.     escitalopram (LEXAPRO) 20 MG tablet Take 20 mg by mouth daily.     norethindrone (MICRONOR) 0.35 MG tablet Take 1 tablet (0.35 mg total) by mouth daily. 90 tablet 2   Prenatal Vit-Fe Fumarate-FA (PRENATAL MULTIVITAMIN) TABS tablet Take 1 tablet by mouth daily at 12 noon.     propranolol (INDERAL) 40 MG tablet Take 1 tablet (40 mg total) by mouth 2 (two) times daily. 180 tablet 3   ibuprofen (ADVIL) 800 MG tablet Take 800 mg by mouth every 8 (eight) hours as needed.     No facility-administered medications prior to visit.    PAST MEDICAL HISTORY: Past Medical History:  Diagnosis Date   ADD (attention deficit disorder)    Anemia    Asthma    Bronchitis    Eczema    Heart palpitations    Hyperthyroidism    Insomnia    Migraine    Recurrent upper respiratory infection (URI)    Urticaria     PAST SURGICAL HISTORY: Past Surgical History:  Procedure Laterality Date   ADENOIDECTOMY     NASAL SEPTOPLASTY W/ TURBINOPLASTY  2017   SINOSCOPY     TONSILLECTOMY     VAGINA RECONSTRUCTION SURGERY N/A     FAMILY HISTORY: Family History  Problem Relation Age of Onset   Hypertension Mother    Migraines Mother    Hypertension Father    Diabetes Father    Migraines Father    Diabetes Brother    Migraines Brother    Diabetes Maternal Grandmother    Stroke Maternal Grandmother    Migraines Maternal Grandmother    Diabetes Paternal Grandfather    Heart disease Neg Hx     SOCIAL HISTORY: Social History   Socioeconomic History   Marital status: Single    Spouse name: Not on file   Number of children: 2   Years of education: Not on file   Highest education level: Some college, no degree  Occupational History   Not on file  Tobacco Use   Smoking status: Former    Packs/day: 0.25    Years: 4.00    Total pack years: 1.00    Types: Cigarettes     Quit date: 08/18/2018    Years since quitting: 3.8   Smokeless tobacco: Never   Tobacco comments:    patient quit 2 weeks ago. 09-01-18  Vaping Use   Vaping Use: Never used  Substance and Sexual Activity   Alcohol use: Never   Drug use: Never   Sexual activity: Yes    Birth control/protection: Injection  Other Topics Concern   Not on file  Social History Narrative   Not on file   Social Determinants of Health   Financial Resource Strain: Not on file  Food Insecurity: No Food Insecurity (06/19/2022)   Hunger Vital Sign    Worried About Running Out of Food in the Last Year: Never true    Ran Out of Food in the Last Year: Never true  Transportation Needs: No Transportation Needs (06/19/2022)   PRAPARE - Hydrologist (Medical): No    Lack of Transportation (Non-Medical): No  Physical Activity: Not on file  Stress: Not on file  Social Connections: Not on  file  Intimate Partner Violence: Not on file    PHYSICAL EXAM  Vitals:   06/19/22 1335  BP: 114/70  Pulse: 84  Weight: 176 lb (79.8 kg)  Height: 5\' 9"  (1.753 m)   Body mass index is 25.99 kg/m.  Generalized: Well developed, in no acute distress  Neurological examination  Mentation: Alert oriented to time, place, history taking. Follows all commands speech and language fluent Cranial nerve II-XII: Pupils were equal round reactive to light. Extraocular movements were full, visual field were full on confrontational test. Facial sensation and strength were normal.  Head turning and shoulder shrug  were normal and symmetric. Motor: The motor testing reveals 5 over 5 strength of all 4 extremities. Good symmetric motor tone is noted throughout.  Sensory: Sensory testing is intact to soft touch on all 4 extremities. No evidence of extinction is noted.  Coordination: Cerebellar testing reveals good finger-nose-finger and heel-to-shin bilaterally.  Gait and station: Gait is normal. Tandem gait is normal.    Reflexes: Deep tendon reflexes are symmetric and normal bilaterally.   DIAGNOSTIC DATA (LABS, IMAGING, TESTING) - I reviewed patient records, labs, notes, testing and imaging myself where available.  Lab Results  Component Value Date   WBC 7.5 07/04/2021   HGB 10.0 (L) 07/04/2021   HCT 31.9 (L) 07/04/2021   MCV 86.2 07/04/2021   PLT 272 07/04/2021      Component Value Date/Time   NA 141 09/19/2021 0903   K 4.6 09/19/2021 0903   CL 105 09/19/2021 0903   CO2 18 (L) 09/19/2021 0903   GLUCOSE 67 (L) 09/19/2021 0903   GLUCOSE 77 07/04/2021 1021   BUN 16 09/19/2021 0903   CREATININE 0.72 09/19/2021 0903   CALCIUM 9.5 09/19/2021 0903   PROT 6.0 (L) 07/04/2021 1021   PROT 6.0 01/22/2021 0926   ALBUMIN 2.7 (L) 07/04/2021 1021   ALBUMIN 3.9 01/22/2021 0926   AST 22 07/04/2021 1021   ALT 17 07/04/2021 1021   ALKPHOS 129 (H) 07/04/2021 1021   BILITOT 0.2 (L) 07/04/2021 1021   BILITOT <0.2 01/22/2021 0926   GFRNONAA >60 07/04/2021 1021   No results found for: "CHOL", "HDL", "LDLCALC", "LDLDIRECT", "TRIG", "CHOLHDL" No results found for: "HGBA1C" Lab Results  Component Value Date   VITAMINB12 567 03/15/2021   Lab Results  Component Value Date   TSH 0.83 12/06/2021    12/08/2021, AGNP-C, DNP 06/19/2022, 2:12 PM [facility] [address] [place], [place] [phone]

## 2022-06-19 NOTE — Patient Instructions (Addendum)
Medication Instructions:  Your physician has recommended you make the following change in your medication STOP: Ibuprofen  *If you need a refill on your cardiac medications before your next appointment, please call your pharmacy*   Lab Work: NONE If you have labs (blood work) drawn today and your tests are completely normal, you will receive your results only by: [place] (if you have MyChart) OR A paper copy in the mail If you have any lab test that is abnormal or we need to change your treatment, we will call you to review the results.   Testing/Procedures: Your physician has requested that you have an echocardiogram. Echocardiography is a painless test that uses sound waves to create images of your heart. It provides your doctor with information about the size and shape of your heart and how well your heart's chambers and valves are working. This procedure takes approximately one hour. There are no restrictions for this procedure.   ZIO AT Long term monitor-Live Telemetry  Your physician has requested you wear a ZIO patch monitor for 14 days.  This is a single patch monitor. [employer] supplies one patch monitor per enrollment. Additional  stickers are not available.  Please do not apply patch if you will be having a Nuclear Stress Test, Echocardiogram, Cardiac CT, MRI,  or Chest Xray during the period you would be wearing the monitor. The patch cannot be worn during  these tests. You cannot remove and re-apply the ZIO AT patch monitor.  Your ZIO patch monitor will be mailed 3 day USPS to your address on file. It may take 3-5 days to  receive your monitor after you have been enrolled.  Once you have received your monitor, please review the enclosed instructions. Your monitor has  already been registered assigning a specific monitor serial # to you.   Billing and Patient Assistance Program information  Carla [name] has been supplied with any insurance information on record for  billing. [employer] offers a sliding scale Patient Assistance Program for patients without insurance, or whose  insurance does not completely cover the cost of the ZIO patch monitor. You must apply for the  Patient Assistance Program to qualify for the discounted rate. To apply, call [employer] at [phone],  select option 4, select option 2 , ask to apply for the Patient Assistance Program, (you can request an  interpreter if needed). [employer] will ask your household income and how many people are in your  household. [employer] will quote your out-of-pocket cost based on this information. They will also be able  to set up a 12 month interest free payment plan if needed.  Applying the monitor   Shave hair from upper left chest.  Hold the abrader disc by orange tab. Rub the abrader in 40 strokes over left upper chest as indicated in  your monitor instructions.  Clean area with 4 enclosed alcohol pads. Use all pads to ensure the area is cleaned thoroughly. Let  dry.  Apply patch as indicated in monitor instructions. Patch will be placed under collarbone on left side of  chest with arrow pointing upward.  Rub patch adhesive wings for 2 minutes. Remove the white label marked "1". Remove the white label  marked "2". Rub patch adhesive wings for 2 additional minutes.  While looking in a mirror, press and release button in center of patch. A small green light will flash 3-4  times. This will be your only indicator that the monitor has been turned on.  Do not  shower for the first 24 hours. You may shower after the first 24 hours.  Press the button if you feel a symptom. You will hear a small click. Record Date, Time and Symptom in  the Patient Log.   Starting the Gateway  In your kit there is a Hydrographic surveyor box the size of a cellphone. This is Airline pilot. It transmits all your  recorded data to [facility]. This box must always stay within 10 feet of you. Open the box and push the *  button. There will  be a light that blinks orange and then green a few times. When the light stops  blinking, the Gateway is connected to the ZIO patch. Call [employer] at [phone] to confirm your monitor is transmitting.  Returning your monitor  Remove your patch and place it inside the [place]. In the lower half of the Gateway there is a white  bag with prepaid postage on it. Place Gateway in bag and seal. Mail package back to [place] as soon as  possible. Your physician should have your final report approximately 7 days after you have mailed back  your monitor. Call [place] at [phone] if you have questions regarding your ZIO AT  patch monitor. Call them immediately if you see an orange light blinking on your monitor.  If your monitor falls off in less than 4 days, contact our Monitor department at [phone]. If your  monitor becomes loose or falls off after 4 days call [employer] at [phone] for suggestions on  securing your monitor   Follow-Up: At [facility], you and your health needs are our priority.  As part of our continuing mission to provide you with exceptional heart care, we have created designated Provider Care Teams.  These Care Teams include your primary Cardiologist (physician) and Advanced Practice Providers (APPs -  Physician Assistants and Nurse Practitioners) who all work together to provide you with the care you need, when you need it.  We recommend signing up for the patient portal called "MyChart".  Sign up information is provided on this After Visit Summary.  MyChart is used to connect with patients for Virtual Visits (Telemedicine).  Patients are able to view lab/test results, encounter notes, upcoming appointments, etc.  Non-urgent messages can be sent to your provider as well.   To learn more about what you can do with MyChart, go to [email or web address].    Your next appointment:   1 year(s)  The format for your next appointment:   In  Person  Provider:   Berniece Salines, DO

## 2022-06-19 NOTE — Progress Notes (Unsigned)
Applied a 14 day Zio AT monitor to patient in the office 

## 2022-06-19 NOTE — Patient Instructions (Addendum)
Orders Placed This Encounter  Procedures   MR BRAIN W WO CONTRAST   Basic Metabolic Panel   Check MRI of the brain with and without contrast

## 2022-06-19 NOTE — Progress Notes (Signed)
Cardio-Obstetrics Clinic  Follow Up Note  Date:  06/19/2022   ID:  Carla [name], DOB June 28, 1991, MRN [id number]  PCP:  Pcp, No   [facility] Providers Cardiologist:  Berniece Salines, DO  Electrophysiologist:  None        Referring MD: No ref. provider found   Chief Complaint: " I am doing well"  History of Present Illness:    Carla [name] is a 31 y.o. female [[id number]] who returns for follow up of  gestational hypertension.   I saw the patient on June 28, 2021 at that time her blood pressure was elevated therefore I started patient on Procardia 10 mg twice a day to make sure that we can keep her under control. At that time she had schedule delivery.  Since I saw the patient she has been able to undergo delivery safely.  She is here today and tells me that postdelivery she had had some elevated blood pressure.   I saw the patient on July 12, 2019 during her postpartum visit her blood pressure is still elevated and therefore increase her nifedipine to 20 mg every 8 hours.  I saw the patient on September 12, 2021 at that time we had stopped her nifedipine and she was placed on Inderal which has helped her migraine prevention.  We kept her on Inderal.  Since her last visit she tells that she has had increasing leg swelling as well as shortness of breath.  She notes that the palpitations are also worsening despite the propanolol.  She is unable to exercise without getting significant increasing heart rate with palpitation which leads her to feeling dizzy.  She has not passed out yet.  She is here today with her daughter.  Prior CV Studies Reviewed: The following studies were reviewed today:    Zio Monitor  The patient wore the monitor for 14 days starting January 29, 2021.   Indication: Palpitations   The minimum heart rate was 57 bpm, maximum heart rate was 174 bpm, and average heart rate was 86 bpm. Predominant underlying rhythm was Sinus Rhythm.      Premature atrial complexes were rare less than 1%. Premature Ventricular complexes were rare less than 1%.   No ventricular tachycardia, no significant pauses, No AV block and no atrial fibrillation present.   A patient triggered events and 7 diary events are associated with sinus rhythm and sinus tachycardia.   Conclusion: Normal/unremarkable study.  We will have to monitor the patient closely as I do suspect that inappropriate sinus tachycardia may be playing a role here.     Transthoracic echocardiogram 02/27/2021 IMPRESSIONS   1. Left ventricular ejection fraction, by estimation, is 60 to 65%. The left ventricle has normal function. The left ventricle has no regional wall motion abnormalities. Left ventricular diastolic parameters were  normal.  2. Right ventricular systolic function is normal. The right ventricular size is normal. There is normal pulmonary artery systolic pressure.   3. The mitral valve is normal in structure. Trivial mitral valve regurgitation. No evidence of mitral stenosis.   4. The aortic valve is normal in structure. Aortic valve regurgitation is not visualized. No aortic stenosis is present.   5. The inferior vena cava is normal in size with greater than 50% respiratory variability, suggesting right atrial pressure of 3 mmHg.   FINDINGS   Left Ventricle: Left ventricular ejection fraction, by estimation, is 60 to 65%. The left ventricle has normal function. The left ventricle has no regional wall  motion abnormalities. The left ventricular internal cavity  size was normal in size. There is  no left ventricular hypertrophy. Left ventricular diastolic parameters  were normal.   Right Ventricle: The right ventricular size is normal. No increase in right ventricular wall thickness. Right ventricular systolic function is normal. There is normal pulmonary artery systolic pressure. The tricuspid  regurgitant velocity is 1.76 m/s, and  with an assumed right atrial pressure  of 3 mmHg, the estimated right ventricular systolic pressure is 27.0 mmHg.   Left Atrium: Left atrial size was normal in size.   Right Atrium: Right atrial size was normal in size.   Pericardium: There is no evidence of pericardial effusion.   Mitral Valve: The mitral valve is normal in structure. Trivial mitral  valve regurgitation. No evidence of mitral valve stenosis.   Tricuspid Valve: The tricuspid valve is normal in structure. Tricuspid  valve regurgitation is not demonstrated. No evidence of tricuspid  stenosis.   Aortic Valve: The aortic valve is normal in structure. Aortic valve  regurgitation is not visualized. No aortic stenosis is present.   Pulmonic Valve: The pulmonic valve was normal in structure. Pulmonic valve  regurgitation is not visualized. No evidence of pulmonic stenosis.   Aorta: The aortic root is normal in size and structure.   Venous: The inferior vena cava is normal in size with greater than 50%  respiratory variability, suggesting right atrial pressure of 3 mmHg.   IAS/Shunts: No atrial level shunt detected by color flow Doppler.     ZIO monitor which was in December 2021 The basic rhythm is normal sinus with an average HR of 85 bpm, periods of sinus tachycardia noted No atrial fibrillation or flutter No high-grade heart block or pathologic pauses There are rare PVC's and rare supraventricular beats without sustained arrhythmias     Past Medical History:  Diagnosis Date   ADD (attention deficit disorder)    Anemia    Asthma    Bronchitis    Eczema    Heart palpitations    Hyperthyroidism    Insomnia    Migraine    Recurrent upper respiratory infection (URI)    Urticaria     Past Surgical History:  Procedure Laterality Date   ADENOIDECTOMY     NASAL SEPTOPLASTY W/ TURBINOPLASTY  2017   SINOSCOPY     TONSILLECTOMY     VAGINA RECONSTRUCTION SURGERY N/A       OB History     Gravida  5   Para  3   Term  3   Preterm      AB   2   Living  3      SAB  1   IAB  1   Ectopic      Multiple  0   Live Births  3               Current Medications: Current Meds  Medication Sig   albuterol (PROVENTIL HFA;VENTOLIN HFA) 108 (90 Base) MCG/ACT inhaler Inhale 2 puffs into the lungs every 6 (six) hours as needed for wheezing or shortness of breath.   Butalbital-APAP-Caffeine 50-325-40 MG capsule Take 1-2 capsules by mouth every 6 (six) hours as needed for headache.   escitalopram (LEXAPRO) 20 MG tablet Take 20 mg by mouth daily.   ibuprofen (ADVIL) 800 MG tablet Take 800 mg by mouth every 8 (eight) hours as needed.   norethindrone (MICRONOR) 0.35 MG tablet Take 1 tablet (0.35 mg total) by mouth daily.  Prenatal Vit-Fe Fumarate-FA (PRENATAL MULTIVITAMIN) TABS tablet Take 1 tablet by mouth daily at 12 noon.   propranolol (INDERAL) 40 MG tablet Take 1 tablet (40 mg total) by mouth 2 (two) times daily.   [DISCONTINUED] norethindrone (MICRONOR) 0.35 MG tablet Take 1 tablet (0.35 mg total) by mouth daily.     Allergies:   Latex   Social History   Socioeconomic History   Marital status: Single    Spouse name: Not on file   Number of children: 2   Years of education: Not on file   Highest education level: Some college, no degree  Occupational History   Not on file  Tobacco Use   Smoking status: Former    Packs/day: 0.25    Years: 4.00    Total pack years: 1.00    Types: Cigarettes    Quit date: 08/18/2018    Years since quitting: 3.8   Smokeless tobacco: Never   Tobacco comments:    patient quit 2 weeks ago. 09-01-18  Vaping Use   Vaping Use: Never used  Substance and Sexual Activity   Alcohol use: Never   Drug use: Never   Sexual activity: Yes    Birth control/protection: Injection  Other Topics Concern   Not on file  Social History Narrative   Not on file   Social Determinants of Health   Financial Resource Strain: Not on file  Food Insecurity: No Food Insecurity (06/19/2022)   Hunger  Vital Sign    Worried About Running Out of Food in the Last Year: Never true    Ran Out of Food in the Last Year: Never true  Transportation Needs: No Transportation Needs (06/19/2022)   PRAPARE - Hydrologist (Medical): No    Lack of Transportation (Non-Medical): No  Physical Activity: Not on file  Stress: Not on file  Social Connections: Not on file      Family History  Problem Relation Age of Onset   Hypertension Mother    Hypertension Father    Diabetes Father    Diabetes Brother    Diabetes Maternal Grandmother    Stroke Maternal Grandmother    Diabetes Paternal Grandfather    Heart disease Neg Hx       ROS:   Review of Systems  Constitution: Negative for decreased appetite, fever and weight gain.  HENT: Negative for congestion, ear discharge, hoarse voice and sore throat.   Eyes: Negative for discharge, redness, vision loss in right eye and visual halos.  Cardiovascular: Negative for chest pain, dyspnea on exertion, leg swelling, orthopnea and palpitations.  Respiratory: Negative for cough, hemoptysis, shortness of breath and snoring.   Endocrine: Negative for heat intolerance and polyphagia.  Hematologic/Lymphatic: Negative for bleeding problem. Does not bruise/bleed easily.  Skin: Negative for flushing, nail changes, rash and suspicious lesions.  Musculoskeletal: Negative for arthritis, joint pain, muscle cramps, myalgias, neck pain and stiffness.  Gastrointestinal: Negative for abdominal pain, bowel incontinence, diarrhea and excessive appetite.  Genitourinary: Negative for decreased libido, genital sores and incomplete emptying.  Neurological: Negative for brief paralysis, focal weakness, headaches and loss of balance.  Psychiatric/Behavioral: Negative for altered mental status, depression and suicidal ideas.  Allergic/Immunologic: Negative for HIV exposure and persistent infections.    Labs/EKG Reviewed:    EKG:   EKG is was ordered  today.  The ekg ordered today demonstrates sinus rhythm, heart rate 87 beats a minute  Recent Labs: 07/04/2021: ALT 17; Hemoglobin 10.0; Platelets 272 09/19/2021: BUN  16; Creatinine, Ser 0.72; Magnesium 1.8; Potassium 4.6; Sodium 141 12/06/2021: TSH 0.83   Recent Lipid Panel No results found for: "CHOL", "TRIG", "HDL", "CHOLHDL", "LDLCALC", "LDLDIRECT"  Physical Exam:    VS:  BP 112/80 (BP Location: Right Arm, Patient Position: Sitting, Cuff Size: Normal)   Pulse 87   Ht 5' 9"  (1.753 m)   Wt 176 lb (79.8 kg)   BMI 25.99 kg/m     Wt Readings from Last 3 Encounters:  06/19/22 176 lb (79.8 kg)  06/04/22 177 lb (80.3 kg)  02/19/22 183 lb (83 kg)     GEN:  Well nourished, well developed in no acute distress HEENT: Normal NECK: No JVD; No carotid bruits LYMPHATICS: No lymphadenopathy CARDIAC: RRR, no murmurs, rubs, gallops RESPIRATORY:  Clear to auscultation without rales, wheezing or rhonchi  ABDOMEN: Soft, non-tender, non-distended MUSCULOSKELETAL:  No edema; No deformity  SKIN: Warm and dry NEUROLOGIC:  Alert and oriented x 3 PSYCHIATRIC:  Normal affect    Risk Assessment/Risk Calculators:                 ASSESSMENT & PLAN:    Shortness of breath Palpitation Lower extremity edema  Her blood pressure is within normal limits today.  She is on the propanolol 20 mg twice daily will also is helping with her migraine headaches. The big issue is the fact that she is having worsening palpitations as well as worsening shortness of breath on exertion .  I would like for [place] to investigate this in more detail I will place a monitor on the patient to rule out any arrhythmia I have had some suspicion that inappropriate sinus tachycardia may be affecting the patient we will be able to understand more. Echocardiogram will also be done to assess LV/RV function and any other structure abnormalities.  Patient Instructions  Medication Instructions:  Your physician has recommended  you make the following change in your medication STOP: Ibuprofen  *If you need a refill on your cardiac medications before your next appointment, please call your pharmacy*   Lab Work: NONE If you have labs (blood work) drawn today and your tests are completely normal, you will receive your results only by: [facility] (if you have MyChart) OR A paper copy in the mail If you have any lab test that is abnormal or we need to change your treatment, we will call you to review the results.   Testing/Procedures: Your physician has requested that you have an echocardiogram. Echocardiography is a painless test that uses sound waves to create images of your heart. It provides your doctor with information about the size and shape of your heart and how well your heart's chambers and valves are working. This procedure takes approximately one hour. There are no restrictions for this procedure.   ZIO AT Long term monitor-Live Telemetry  Your physician has requested you wear a ZIO patch monitor for 14 days.  This is a single patch monitor. [employer] supplies one patch monitor per enrollment. Additional  stickers are not available.  Please do not apply patch if you will be having a Nuclear Stress Test, Echocardiogram, Cardiac CT, MRI,  or Chest Xray during the period you would be wearing the monitor. The patch cannot be worn during  these tests. You cannot remove and re-apply the ZIO AT patch monitor.  Your ZIO patch monitor will be mailed 3 day USPS to your address on file. It may take 3-5 days to  receive your monitor after you have  been enrolled.  Once you have received your monitor, please review the enclosed instructions. Your monitor has  already been registered assigning a specific monitor serial # to you.   Billing and Patient Assistance Program information  Theodore Demark has been supplied with any insurance information on record for billing. [employer] offers a sliding scale Patient Assistance  Program for patients without insurance, or whose  insurance does not completely cover the cost of the ZIO patch monitor. You must apply for the  Patient Assistance Program to qualify for the discounted rate. To apply, call [employer] at [phone],  select option 4, select option 2 , ask to apply for the Patient Assistance Program, (you can request an  interpreter if needed). [employer] will ask your household income and how many people are in your  household. [employer] will quote your out-of-pocket cost based on this information. They will also be able  to set up a 12 month interest free payment plan if needed.  Applying the monitor   Shave hair from upper left chest.  Hold the abrader disc by orange tab. Rub the abrader in 40 strokes over left upper chest as indicated in  your monitor instructions.  Clean area with 4 enclosed alcohol pads. Use all pads to ensure the area is cleaned thoroughly. Let  dry.  Apply patch as indicated in monitor instructions. Patch will be placed under collarbone on left side of  chest with arrow pointing upward.  Rub patch adhesive wings for 2 minutes. Remove the white label marked "1". Remove the white label  marked "2". Rub patch adhesive wings for 2 additional minutes.  While looking in a mirror, press and release button in center of patch. A small green light will flash 3-4  times. This will be your only indicator that the monitor has been turned on.  Do not shower for the first 24 hours. You may shower after the first 24 hours.  Press the button if you feel a symptom. You will hear a small click. Record Date, Time and Symptom in  the Patient Log.   Starting the Gateway  In your kit there is a Hydrographic surveyor box the size of a cellphone. This is Airline pilot. It transmits all your  recorded data to [facility]. This box must always stay within 10 feet of you. Open the box and push the *  button. There will be a light that blinks orange and then green a few times.  When the light stops  blinking, the Gateway is connected to the ZIO patch. Call [employer] at [phone] to confirm your monitor is transmitting.  Returning your monitor  Remove your patch and place it inside the [place]. In the lower half of the Gateway there is a white  bag with prepaid postage on it. Place Gateway in bag and seal. Mail package back to [place] as soon as  possible. Your physician should have your final report approximately 7 days after you have mailed back  your monitor. Call [facility] at [phone] if you have questions regarding your ZIO AT  patch monitor. Call them immediately if you see an orange light blinking on your monitor.  If your monitor falls off in less than 4 days, contact our Monitor department at [phone]. If your  monitor becomes loose or falls off after 4 days call [employer] at [phone] for suggestions on  securing your monitor   Follow-Up: At [facility], you and your health needs are our priority.  As part  of our continuing mission to provide you with exceptional heart care, we have created designated Provider Care Teams.  These Care Teams include your primary Cardiologist (physician) and Advanced Practice Providers (APPs -  Physician Assistants and Nurse Practitioners) who all work together to provide you with the care you need, when you need it.  We recommend signing up for the patient portal called "MyChart".  Sign up information is provided on this After Visit Summary.  MyChart is used to connect with patients for Virtual Visits (Telemedicine).  Patients are able to view lab/test results, encounter notes, upcoming appointments, etc.  Non-urgent messages can be sent to your provider as well.   To learn more about what you can do with MyChart, go to [email or web address].    Your next appointment:   1 year(s)  The format for your next appointment:   In Person  Provider:   Berniece Salines, DO    Dispo:  Return  in about 1 year (around 06/20/2023).   Medication Adjustments/Labs and Tests Ordered: Current medicines are reviewed at length with the patient today.  Concerns regarding medicines are outlined above.  Tests Ordered: Orders Placed This Encounter  Procedures   LONG TERM MONITOR-LIVE TELEMETRY (3-14 DAYS)   EKG 12-Lead   ECHOCARDIOGRAM COMPLETE   Medication Changes: No orders of the defined types were placed in this encounter.

## 2022-06-20 LAB — BASIC METABOLIC PANEL
BUN/Creatinine Ratio: 10 (ref 9–23)
BUN: 7 mg/dL (ref 6–20)
CO2: 25 mmol/L (ref 20–29)
Calcium: 9.2 mg/dL (ref 8.7–10.2)
Chloride: 102 mmol/L (ref 96–106)
Creatinine, Ser: 0.72 mg/dL (ref 0.57–1.00)
Glucose: 77 mg/dL (ref 70–99)
Potassium: 4 mmol/L (ref 3.5–5.2)
Sodium: 141 mmol/L (ref 134–144)
eGFR: 114 mL/min/{1.73_m2} (ref >59)

## 2022-06-23 ENCOUNTER — Telehealth: Payer: Self-pay | Admitting: Neurology

## 2022-06-23 NOTE — Telephone Encounter (Signed)
Medicaid healthy blue Berkley Harvey: [id number] exp. 06/23/22-08/21/22 sent to [facility]

## 2022-06-26 IMAGING — US US MFM FETAL BPP W/ NON-STRESS
1 series · 15 of 28 positions shown · non-contrast
Comparison: none

[Series 1: us mfm fetal bpp w/ non-stress · 42 acquisitions, 15 frames shown]
[im 1/42]
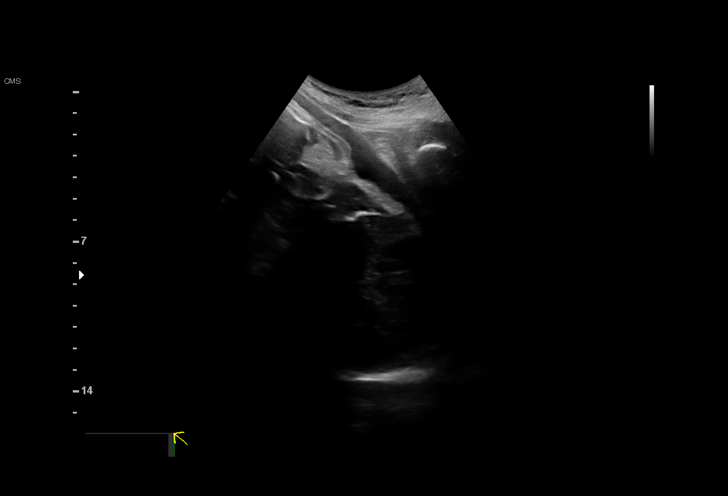
[im 4/42]
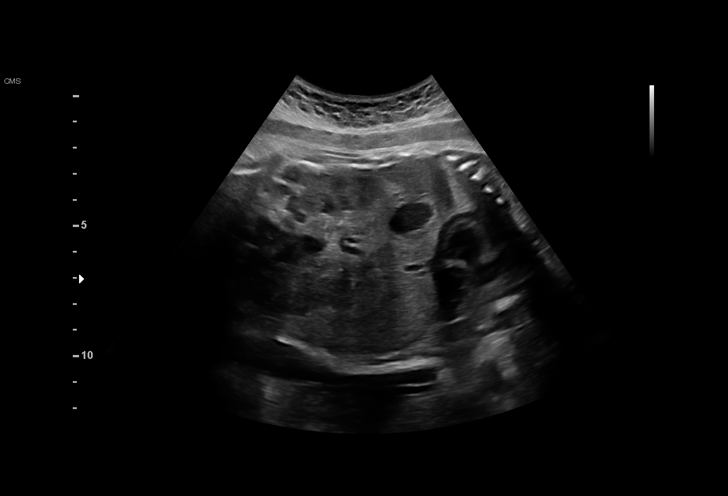
[im 7/42]
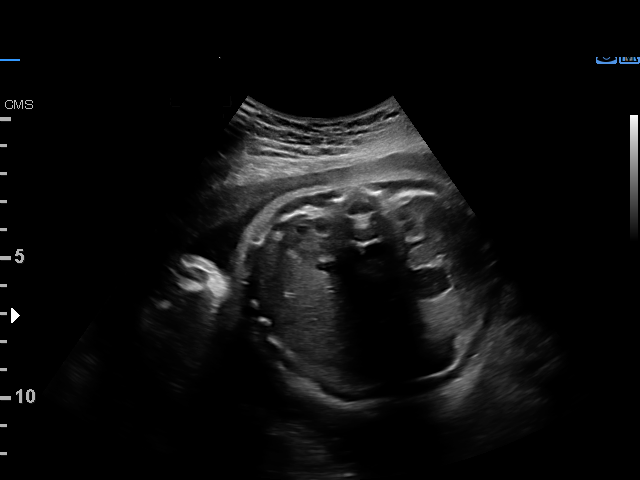
[im 10/42]
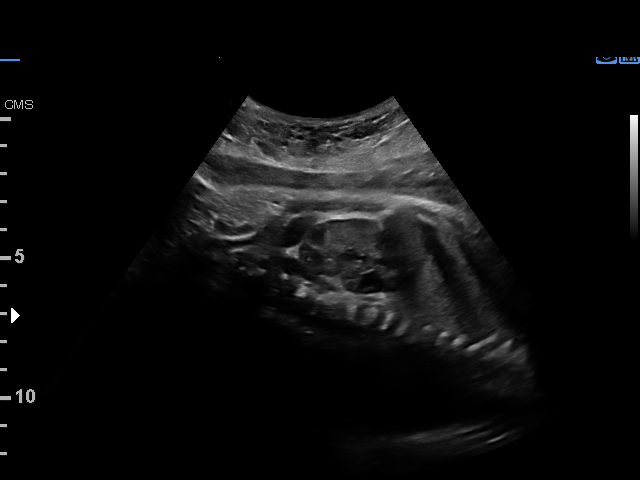
[im 13/42]
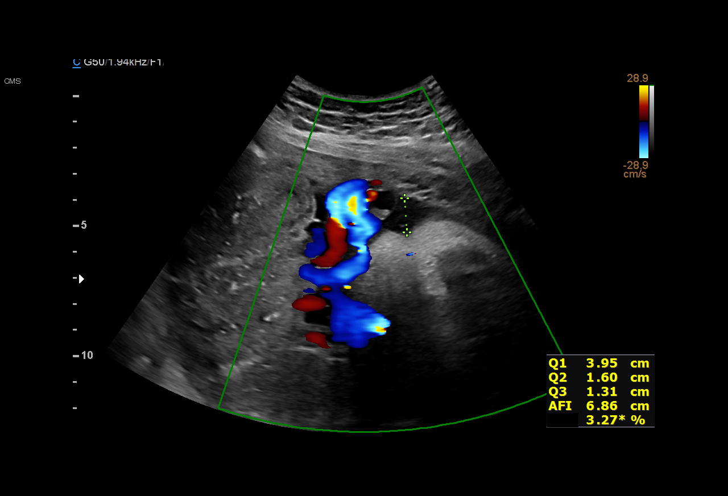
[im 16/42]
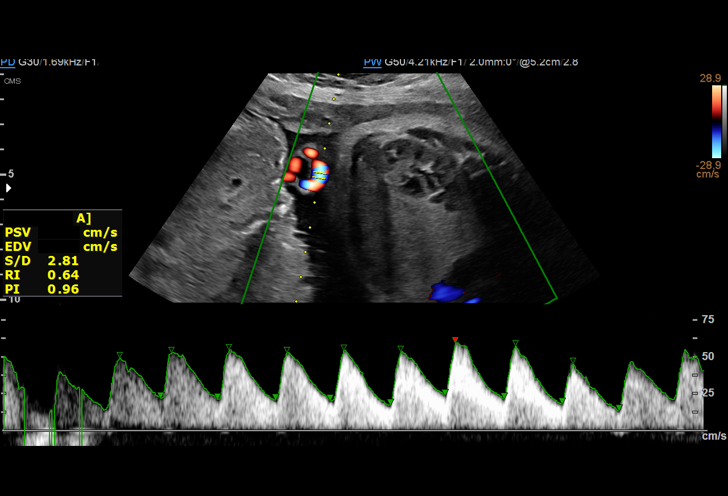
[im 19/42]
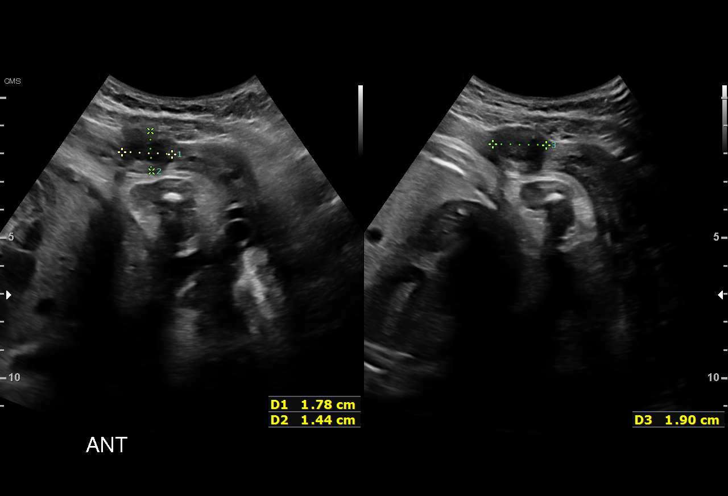
[im 22/42]
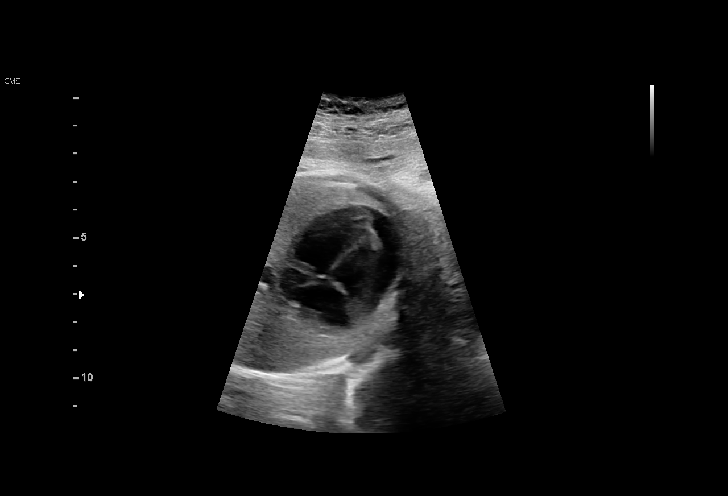
[im 23/42]
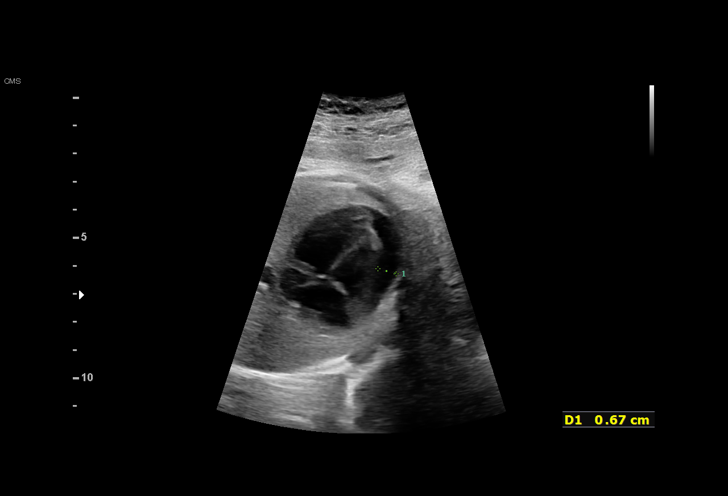
[im 26/42]
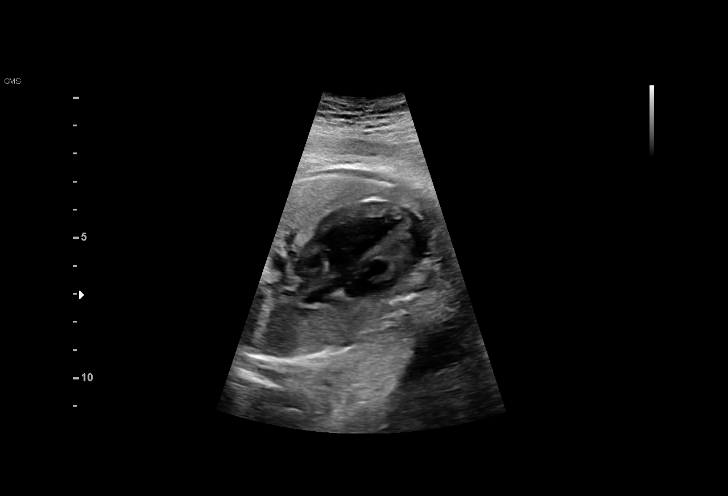
[im 29/42]
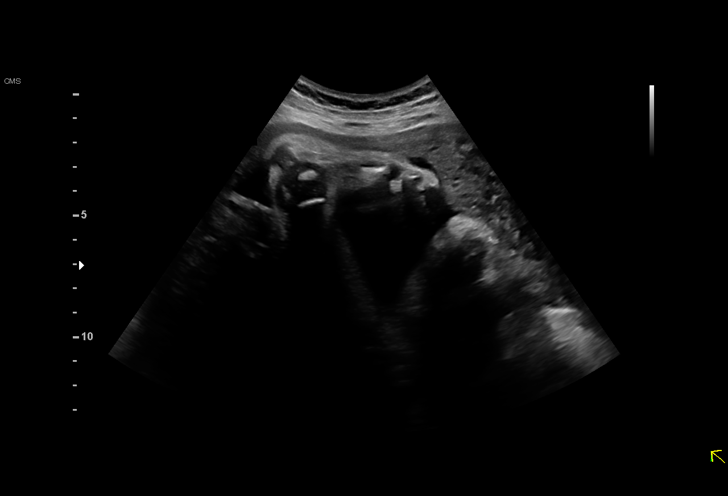
[im 32/42]
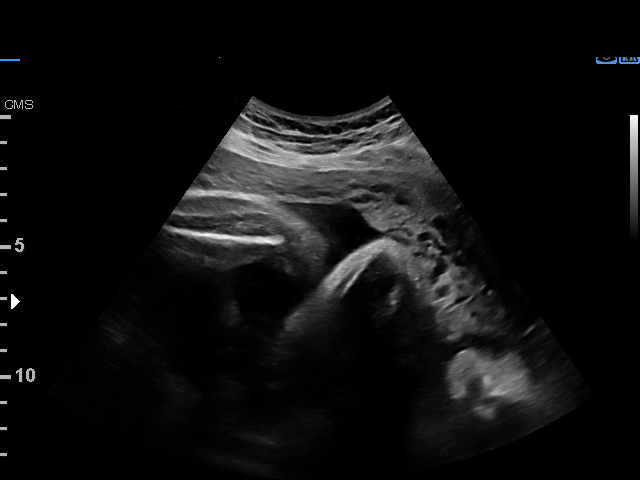
[im 35/42]
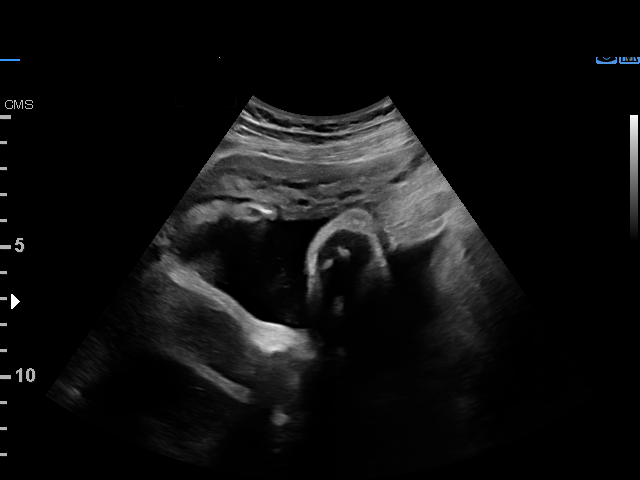
[im 38/42]
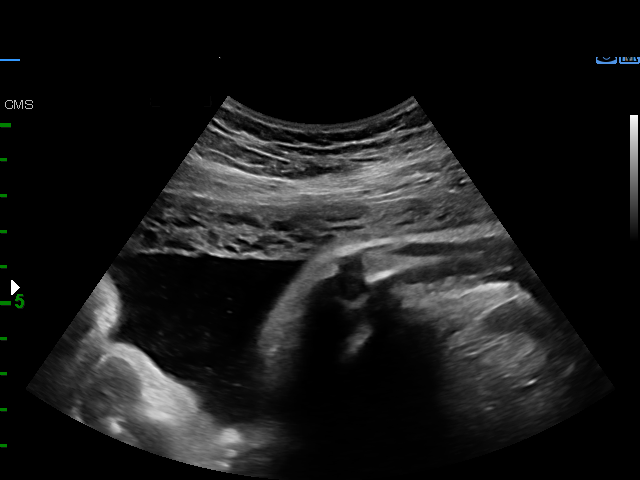
[im 42/42]
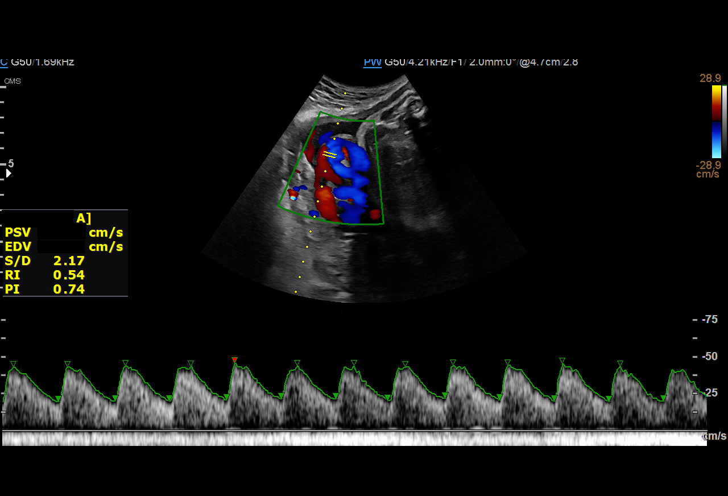

[15 of 28 positions shown; findings below may reference images not displayed]

DAZA

 Ref. Address:     Faculty

    W/NONSTRESS

Indications

 36 weeks gestation of pregnancy
 Decreased fetal movement
 Maternal care for known or suspected poor
 fetal growth, third trimester, not applicable or
 unspecified IUGR
 Hyperthyroid
 Negative AFP/ Negative Horizon/ LR NIPS
 Asthma                                         N66.W6 [id number]
 Poor obstetric history: Previous fetal growth
 restriction (FGR)
 Anemia during pregnancy in third trimester
Fetal Evaluation

 Num Of Fetuses:         1
 Fetal Heart Rate(bpm):  173
 Cardiac Activity:       Observed
 Presentation:           Cephalic
 Placenta:               Right lateral
 P. Cord Insertion:      Visualized

 Amniotic Fluid
 AFI FV:      Within normal limits

 AFI Sum(cm)     %Tile       Largest Pocket(cm)
 11              30
 RUQ(cm)       RLQ(cm)       LUQ(cm)        LLQ(cm)
 4
Biophysical Evaluation

 Amniotic F.V:   Pocket => 2 cm             F. Tone:        Observed
 F. Movement:    Observed                   N.S.T:          Reactive
 F. Breathing:   Observed                   Score:          [DATE]
OB History

 Blood Type:   O+
 Gravidity:    5         Term:   2        Prem:   0        SAB:   1
 TOP:          1       Ectopic:  0        Living: 2
Gestational Age

 LMP:           36w 4d        Date:  10/18/20                 EDD:   07/25/21
 Best:          36w 4d     Det. By:  Early Ultrasound         EDD:   07/25/21
                                     (12/24/20)
Doppler - Fetal Vessels

 Umbilical Artery
  S/D     %tile      RI    %tile      PI    %tile            ADFV    RDFV
  2.61       65    0.62       73     0.9       68               No      No

Impression

 Fetal growth restriction.  On previous growth assessment
 performed 2 weeks ago, the estimated fetal weight was at the
 8 percentile.  Patient complains of intermittent decreased
 fetal movements.

 Amniotic fluid is normal and good fetal activity seen.
 Umbilical artery Doppler showed normal forward diastolic
 flow.  Cephalic presentation.  Antenatal testing is reassuring.
 NST is reactive.  BPP [DATE].

 We discussed timing of delivery.  Although there is no severe
 fetal growth restriction, decreased fetal movements increases
 the risk of perinatal mortality and morbidity.
 I recommend delivery at 37 to 38 weeks gestation.  Patient
 agrees with my recommendation.

 She has an OB appointment on 07/04/2021.
Recommendations

 - Delivery next week
                 [place], Ainsley

## 2022-07-08 ENCOUNTER — Ambulatory Visit (HOSPITAL_COMMUNITY): Payer: Medicaid Other | Attending: Cardiovascular Disease

## 2022-07-08 DIAGNOSIS — R0602 Shortness of breath: Secondary | ICD-10-CM | POA: Insufficient documentation

## 2022-07-08 LAB — ECHOCARDIOGRAM COMPLETE
Area-P 1/2: 4.94 cm2
MV M vel: 5.05 m/s
MV Peak grad: 102 mmHg
Radius: 0.5 cm
S' Lateral: 2.6 cm

## 2022-07-24 ENCOUNTER — Ambulatory Visit (HOSPITAL_COMMUNITY): Payer: Medicaid Other

## 2022-08-02 ENCOUNTER — Ambulatory Visit (HOSPITAL_BASED_OUTPATIENT_CLINIC_OR_DEPARTMENT_OTHER)
Admission: RE | Admit: 2022-08-02 | Discharge: 2022-08-02 | Disposition: A | Discharge status: Home / Self Care | Payer: Medicaid Other | Source: Ambulatory Visit | Attending: Neurology | Admitting: Neurology

## 2022-08-02 DIAGNOSIS — G43709 Chronic migraine without aura, not intractable, without status migrainosus: Secondary | ICD-10-CM

## 2022-08-05 ENCOUNTER — Emergency Department (HOSPITAL_BASED_OUTPATIENT_CLINIC_OR_DEPARTMENT_OTHER)
Admission: EM | Admit: 2022-08-05 | Discharge: 2022-08-05 | Disposition: A | Discharge status: Home / Self Care | Payer: Medicaid Other | Attending: Emergency Medicine | Admitting: Emergency Medicine

## 2022-08-05 ENCOUNTER — Emergency Department (HOSPITAL_BASED_OUTPATIENT_CLINIC_OR_DEPARTMENT_OTHER): Payer: Medicaid Other

## 2022-08-05 ENCOUNTER — Other Ambulatory Visit: Payer: Self-pay

## 2022-08-05 ENCOUNTER — Encounter (HOSPITAL_BASED_OUTPATIENT_CLINIC_OR_DEPARTMENT_OTHER): Payer: Self-pay

## 2022-08-05 DIAGNOSIS — M25562 Pain in left knee: Secondary | ICD-10-CM | POA: Insufficient documentation

## 2022-08-05 DIAGNOSIS — Z91040 Latex allergy status: Secondary | ICD-10-CM | POA: Diagnosis not present

## 2022-08-05 DIAGNOSIS — Y92410 Unspecified street and highway as the place of occurrence of the external cause: Secondary | ICD-10-CM | POA: Insufficient documentation

## 2022-08-05 DIAGNOSIS — M542 Cervicalgia: Secondary | ICD-10-CM | POA: Diagnosis present

## 2022-08-05 DIAGNOSIS — G8911 Acute pain due to trauma: Secondary | ICD-10-CM | POA: Insufficient documentation

## 2022-08-05 DIAGNOSIS — R519 Headache, unspecified: Secondary | ICD-10-CM | POA: Insufficient documentation

## 2022-08-05 LAB — PREGNANCY, URINE: Preg Test, Ur: NEGATIVE
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
## 2022-08-05 NOTE — Discharge Instructions (Addendum)
Please refer to the attached instructions. Wear soft collar as discussed for comfort. You may safely use ibuprofen or tylenol for discomfort while breast feeding.
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
## 2022-08-09 ENCOUNTER — Ambulatory Visit (HOSPITAL_BASED_OUTPATIENT_CLINIC_OR_DEPARTMENT_OTHER): Payer: Medicaid Other

## 2022-08-09 ENCOUNTER — Encounter (HOSPITAL_BASED_OUTPATIENT_CLINIC_OR_DEPARTMENT_OTHER): Payer: Self-pay

## 2022-08-15 ENCOUNTER — Ambulatory Visit
Admission: RE | Admit: 2022-08-15 | Discharge: 2022-08-15 | Disposition: A | Discharge status: Home / Self Care | Payer: Medicaid Other | Source: Ambulatory Visit | Attending: Family Medicine | Admitting: Family Medicine

## 2022-08-15 ENCOUNTER — Other Ambulatory Visit: Payer: Self-pay | Admitting: Family Medicine

## 2022-08-15 DIAGNOSIS — M79671 Pain in right foot: Secondary | ICD-10-CM

## 2022-08-15 DIAGNOSIS — M542 Cervicalgia: Secondary | ICD-10-CM

## 2022-08-15 DIAGNOSIS — M25511 Pain in right shoulder: Secondary | ICD-10-CM
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
## 2022-08-28 ENCOUNTER — Ambulatory Visit: Payer: Medicaid Other | Attending: Internal Medicine

## 2022-08-28 ENCOUNTER — Other Ambulatory Visit: Payer: Self-pay

## 2022-08-28 DIAGNOSIS — R262 Difficulty in walking, not elsewhere classified: Secondary | ICD-10-CM | POA: Diagnosis present

## 2022-08-28 DIAGNOSIS — M542 Cervicalgia: Secondary | ICD-10-CM | POA: Insufficient documentation

## 2022-08-28 DIAGNOSIS — M5441 Lumbago with sciatica, right side: Secondary | ICD-10-CM | POA: Diagnosis present

## 2022-08-28 DIAGNOSIS — M6281 Muscle weakness (generalized): Secondary | ICD-10-CM | POA: Diagnosis present

## 2022-08-28 DIAGNOSIS — M5442 Lumbago with sciatica, left side: Secondary | ICD-10-CM | POA: Insufficient documentation

## 2022-09-10 ENCOUNTER — Ambulatory Visit: Payer: Medicaid Other

## 2022-09-10 DIAGNOSIS — M6281 Muscle weakness (generalized): Secondary | ICD-10-CM

## 2022-09-10 DIAGNOSIS — M542 Cervicalgia: Secondary | ICD-10-CM
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
## 2022-09-12 ENCOUNTER — Ambulatory Visit: Payer: Medicaid Other

## 2022-09-17 ENCOUNTER — Ambulatory Visit: Payer: Medicaid Other

## 2022-09-17 DIAGNOSIS — M542 Cervicalgia: Secondary | ICD-10-CM

## 2022-09-17 DIAGNOSIS — M6281 Muscle weakness (generalized): Secondary | ICD-10-CM
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
## 2022-09-19 ENCOUNTER — Ambulatory Visit: Payer: Medicaid Other

## 2022-09-19 DIAGNOSIS — M542 Cervicalgia: Secondary | ICD-10-CM

## 2022-09-19 DIAGNOSIS — R262 Difficulty in walking, not elsewhere classified: Secondary | ICD-10-CM

## 2022-09-19 DIAGNOSIS — M5441 Lumbago with sciatica, right side: Secondary | ICD-10-CM

## 2022-09-19 DIAGNOSIS — M6281 Muscle weakness (generalized): Secondary | ICD-10-CM
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
## 2022-09-24 ENCOUNTER — Ambulatory Visit: Payer: Medicaid Other | Attending: Internal Medicine

## 2022-09-24 DIAGNOSIS — M6281 Muscle weakness (generalized): Secondary | ICD-10-CM | POA: Diagnosis present

## 2022-09-24 DIAGNOSIS — M5441 Lumbago with sciatica, right side: Secondary | ICD-10-CM | POA: Insufficient documentation

## 2022-09-24 DIAGNOSIS — M5442 Lumbago with sciatica, left side: Secondary | ICD-10-CM | POA: Insufficient documentation

## 2022-09-24 DIAGNOSIS — R262 Difficulty in walking, not elsewhere classified: Secondary | ICD-10-CM | POA: Diagnosis present

## 2022-09-24 DIAGNOSIS — M542 Cervicalgia: Secondary | ICD-10-CM | POA: Insufficient documentation
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
## 2022-09-26 ENCOUNTER — Ambulatory Visit: Payer: Medicaid Other

## 2022-09-26 DIAGNOSIS — M542 Cervicalgia: Secondary | ICD-10-CM | POA: Diagnosis not present
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
## 2022-10-01 ENCOUNTER — Ambulatory Visit: Payer: Medicaid Other

## 2022-10-01 DIAGNOSIS — M542 Cervicalgia: Secondary | ICD-10-CM

## 2022-10-01 DIAGNOSIS — M6281 Muscle weakness (generalized): Secondary | ICD-10-CM

## 2022-10-01 DIAGNOSIS — M5441 Lumbago with sciatica, right side: Secondary | ICD-10-CM

## 2022-10-01 DIAGNOSIS — R262 Difficulty in walking, not elsewhere classified: Secondary | ICD-10-CM
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
## 2022-10-03 ENCOUNTER — Ambulatory Visit: Payer: Medicaid Other

## 2022-10-09 ENCOUNTER — Ambulatory Visit: Payer: Medicaid Other

## 2022-10-09 DIAGNOSIS — M6281 Muscle weakness (generalized): Secondary | ICD-10-CM

## 2022-10-09 DIAGNOSIS — R262 Difficulty in walking, not elsewhere classified: Secondary | ICD-10-CM

## 2022-10-09 DIAGNOSIS — M542 Cervicalgia: Secondary | ICD-10-CM

## 2022-10-09 DIAGNOSIS — M5441 Lumbago with sciatica, right side: Secondary | ICD-10-CM
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
## 2022-10-23 ENCOUNTER — Ambulatory Visit: Payer: Medicaid Other | Admitting: Neurology

## 2022-11-18 ENCOUNTER — Ambulatory Visit: Payer: Medicaid Other | Admitting: Neurology

## 2023-01-06 ENCOUNTER — Ambulatory Visit: Payer: Medicaid Other | Admitting: Advanced Practice Midwife

## 2023-01-06 ENCOUNTER — Other Ambulatory Visit (HOSPITAL_COMMUNITY)
Admission: RE | Admit: 2023-01-06 | Discharge: 2023-01-06 | Disposition: A | Discharge status: Home / Self Care | Payer: Medicaid Other | Source: Ambulatory Visit | Attending: Advanced Practice Midwife | Admitting: Advanced Practice Midwife

## 2023-01-06 ENCOUNTER — Encounter: Payer: Self-pay | Admitting: Advanced Practice Midwife

## 2023-01-06 VITALS — BP 118/67 | HR 83 | Ht 69.75 in | Wt 168.0 lb

## 2023-01-06 DIAGNOSIS — R87612 Low grade squamous intraepithelial lesion on cytologic smear of cervix (LGSIL): Secondary | ICD-10-CM | POA: Diagnosis not present

## 2023-01-06 DIAGNOSIS — N898 Other specified noninflammatory disorders of vagina: Secondary | ICD-10-CM

## 2023-01-06 DIAGNOSIS — E0590 Thyrotoxicosis, unspecified without thyrotoxic crisis or storm: Secondary | ICD-10-CM

## 2023-01-06 MED ORDER — FLUCONAZOLE 150 MG PO TABS: 150.0000 mg | ORAL_TABLET | Freq: Once | ORAL | 1 refills | Status: DC | PRN

## 2023-01-06 NOTE — Progress Notes (Signed)
GYNECOLOGY CARE ENCOUNTER NOTE  Subjective:   Carla [name] is a 32 y.o. [phone] female here for a routine annual gynecologic exam.  However, it has not been a full year since her last pap, so will need to defer annual exam until after March 1.    Current complaints: Fatigue and recent yeast vaginitis.  Treated a week ago but wants testing "for everything".   Denies abnormal vaginal bleeding, discharge, pelvic pain, problems with intercourse or other gynecologic concerns.    Obstetric History OB History  Gravida Para Term Preterm AB Living  5 3 3   2 3  $ SAB IAB Ectopic Multiple Live Births  1 1   0 3    # Outcome Date GA Lbr Len/2nd Weight Sex Delivery Anes PTL Lv  5 Term 07/04/21 [redacted]w[redacted]d/ 00:11 5 lb 1.3 oz (2.305 kg) F Vag-Spont None  LIV  4 SAB 07/2020          3 IAB 04/2020          2 Term 06/18/11    M Vag-Spont   LIV  1 Term 11/27/09    F Vag-Spont   LIV    Past Medical History:  Diagnosis Date   ADD (attention deficit disorder)    Anemia    Asthma    Bronchitis    Eczema    Heart palpitations    Hyperthyroidism    Insomnia    Migraine    Recurrent upper respiratory infection (URI)    Urticaria     Past Surgical History:  Procedure Laterality Date   ADENOIDECTOMY     NASAL SEPTOPLASTY W/ TURBINOPLASTY  2017   SINOSCOPY     TONSILLECTOMY     VAGINA RECONSTRUCTION SURGERY N/A     Current Outpatient Medications on File Prior to Visit  Medication Sig Dispense Refill   albuterol (PROVENTIL HFA;VENTOLIN HFA) 108 (90 Base) MCG/ACT inhaler Inhale 2 puffs into the lungs every 6 (six) hours as needed for wheezing or shortness of breath. (Patient not taking: Reported on 01/06/2023)     Butalbital-APAP-Caffeine 50-325-40 MG capsule Take 1-2 capsules by mouth every 6 (six) hours as needed for headache. (Patient not taking: Reported on 01/06/2023) 30 capsule 3   citalopram (CELEXA) 20 MG tablet Take 20 mg by mouth at bedtime. (Patient not taking: Reported on 01/06/2023)      escitalopram (LEXAPRO) 20 MG tablet Take 20 mg by mouth daily. (Patient not taking: Reported on 01/06/2023)     ibuprofen (ADVIL) 800 MG tablet Take 800 mg by mouth every 8 (eight) hours as needed. (Patient not taking: Reported on 01/06/2023)     norethindrone (MICRONOR) 0.35 MG tablet Take 1 tablet (0.35 mg total) by mouth daily. (Patient not taking: Reported on 01/06/2023) 90 tablet 2   Prenatal Vit-Fe Fumarate-FA (PRENATAL MULTIVITAMIN) TABS tablet Take 1 tablet by mouth daily at 12 noon. (Patient not taking: Reported on 01/06/2023)     propranolol (INDERAL) 40 MG tablet Take 1 tablet (40 mg total) by mouth 2 (two) times daily. (Patient not taking: Reported on 01/06/2023) 180 tablet 3   No current facility-administered medications on file prior to visit.    Allergies  Allergen Reactions   Latex Hives    Social History   Socioeconomic History   Marital status: Single    Spouse name: Not on file   Number of children: 2   Years of education: Not on file   Highest education level: Some college, no degree  Occupational History   Not on file  Tobacco Use   Smoking status: Former    Packs/day: 0.25    Years: 4.00    Total pack years: 1.00    Types: Cigarettes    Quit date: 08/18/2018    Years since quitting: 4.3   Smokeless tobacco: Never   Tobacco comments:    patient quit 2 weeks ago. 09-01-18  Vaping Use   Vaping Use: Never used  Substance and Sexual Activity   Alcohol use: Never   Drug use: Never   Sexual activity: Yes    Birth control/protection: Injection  Other Topics Concern   Not on file  Social History Narrative   Not on file   Social Determinants of Health   Financial Resource Strain: Not on file  Food Insecurity: No Food Insecurity (06/19/2022)   Hunger Vital Sign    Worried About Running Out of Food in the Last Year: Never true    Ran Out of Food in the Last Year: Never true  Transportation Needs: No Transportation Needs (06/19/2022)   PRAPARE -  Hydrologist (Medical): No    Lack of Transportation (Non-Medical): No  Physical Activity: Not on file  Stress: Not on file  Social Connections: Not on file  Intimate Partner Violence: Not on file    Family History  Problem Relation Age of Onset   Hypertension Mother    Migraines Mother    Hypertension Father    Diabetes Father    Migraines Father    Diabetes Brother    Migraines Brother    Diabetes Maternal Grandmother    Stroke Maternal Grandmother    Migraines Maternal Grandmother    Diabetes Paternal Grandfather    Heart disease Neg Hx     The following portions of the patient's history were reviewed and updated as appropriate: allergies, current medications, past family history, past medical history, past social history, past surgical history and problem list.  Review of Systems Pertinent items noted in HPI and remainder of comprehensive ROS otherwise negative.   Objective:  BP 118/67   Pulse 83   Ht 5' 9.75" (1.772 m)   Wt 168 lb (76.2 kg)   Breastfeeding No   BMI 24.28 kg/m  CONSTITUTIONAL: Well-developed, well-nourished female in no acute distress.  SKIN: Skin is warm and dry. No rash noted. Not diaphoretic. No erythema. No pallor. PSYCHIATRIC: Normal mood and affect. Normal behavior. Normal judgment and thought content. PELVIC: Normal appearing external genitalia; normal appearing vaginal mucosa and cervix.  No abnormal discharge noted.  Swabs obtained  Assessment:  Yeast vaginitis Fatigue,  rule out Hypothyroidism History of Hyperthyroidism (last testing was normal) LGSI/ASCUS pap, needs retest after March 1st   Plan:  Thyroid panel ordered Plans to see primary doctor soon to review results Culture swabs obtained Rx Diflucan prn Return after march 1 for pap/annual Please refer to After Visit Summary for other counseling recommendations.

## 2023-01-06 NOTE — Progress Notes (Signed)
Vaginal cultures. Kathrene Alu RN

## 2023-01-07 LAB — CERVICOVAGINAL ANCILLARY ONLY
Bacterial Vaginitis (gardnerella): NEGATIVE
Candida Glabrata: NEGATIVE
Candida Vaginitis: NEGATIVE
Chlamydia: NEGATIVE
Comment: NEGATIVE
Comment: NEGATIVE
Comment: NEGATIVE
Comment: NEGATIVE
Comment: NEGATIVE
Comment: NORMAL
Neisseria Gonorrhea: NEGATIVE
Trichomonas: NEGATIVE

## 2023-01-07 LAB — THYROID PANEL WITH TSH
Free Thyroxine Index: 2.4 (ref 1.2–4.9)
T3 Uptake Ratio: 26 % (ref 24–39)
T4, Total: 9.1 ug/dL (ref 4.5–12.0)
TSH: 0.54 u[IU]/mL (ref 0.450–4.500)

## 2023-01-15 ENCOUNTER — Emergency Department (HOSPITAL_BASED_OUTPATIENT_CLINIC_OR_DEPARTMENT_OTHER)
Admission: EM | Admit: 2023-01-15 | Discharge: 2023-01-15 | Disposition: A | Discharge status: Home / Self Care | Payer: Medicaid Other | Attending: Emergency Medicine | Admitting: Emergency Medicine

## 2023-01-15 ENCOUNTER — Encounter (HOSPITAL_BASED_OUTPATIENT_CLINIC_OR_DEPARTMENT_OTHER): Payer: Self-pay | Admitting: Urology

## 2023-01-15 ENCOUNTER — Other Ambulatory Visit: Payer: Self-pay

## 2023-01-15 ENCOUNTER — Ambulatory Visit: Payer: Self-pay | Admitting: *Deleted

## 2023-01-15 DIAGNOSIS — Z7951 Long term (current) use of inhaled steroids: Secondary | ICD-10-CM | POA: Insufficient documentation

## 2023-01-15 DIAGNOSIS — E039 Hypothyroidism, unspecified: Secondary | ICD-10-CM | POA: Insufficient documentation

## 2023-01-15 DIAGNOSIS — Z91040 Latex allergy status: Secondary | ICD-10-CM | POA: Insufficient documentation

## 2023-01-15 DIAGNOSIS — R519 Headache, unspecified: Secondary | ICD-10-CM | POA: Diagnosis present

## 2023-01-15 DIAGNOSIS — Z79899 Other long term (current) drug therapy: Secondary | ICD-10-CM | POA: Insufficient documentation

## 2023-01-15 DIAGNOSIS — G43809 Other migraine, not intractable, without status migrainosus: Secondary | ICD-10-CM

## 2023-01-15 DIAGNOSIS — J45909 Unspecified asthma, uncomplicated: Secondary | ICD-10-CM | POA: Diagnosis not present

## 2023-01-15 MED ORDER — KETOROLAC TROMETHAMINE 15 MG/ML IJ SOLN
15.0000 mg | Freq: Once | INTRAMUSCULAR | Status: AC
  Administered 2023-01-15: 15 mg via INTRAVENOUS
  Filled 2023-01-15: qty 1

## 2023-01-15 MED ORDER — PROCHLORPERAZINE MALEATE 10 MG PO TABS: 10.0000 mg | ORAL_TABLET | Freq: Two times a day (BID) | ORAL | 0 refills | Status: DC | PRN

## 2023-01-15 MED ORDER — SODIUM CHLORIDE 0.9 % IV BOLUS
1000.0000 mL | Freq: Once | INTRAVENOUS | Status: AC
  Administered 2023-01-15: 1000 mL via INTRAVENOUS

## 2023-01-15 MED ORDER — DIPHENHYDRAMINE HCL 50 MG/ML IJ SOLN
25.0000 mg | Freq: Once | INTRAMUSCULAR | Status: AC
  Administered 2023-01-15: 25 mg via INTRAVENOUS
  Filled 2023-01-15: qty 1

## 2023-01-15 MED ORDER — METOCLOPRAMIDE HCL 5 MG/ML IJ SOLN
10.0000 mg | Freq: Once | INTRAMUSCULAR | Status: AC
  Administered 2023-01-15: 10 mg via INTRAVENOUS
  Filled 2023-01-15: qty 2

## 2023-01-15 NOTE — ED Provider Notes (Signed)
[facility] Provider Note   CSN: [id number] Arrival date & time: 01/15/23  1832     History  Chief Complaint  Patient presents with   Migraine    Carla [name] is a 32 y.o. female history of ADD, migraine headache, hypothyroidism presenting today for evaluation of headache.  Patient states she started to have headache since last night.  States she has pain in her forehead, in the temporal area, top of her head and the back of her head.  She has tried her migraine medications at home including propranolol, Fioricet with no relief.  Reports photophobia, nausea and vomiting.  Denies fever, extremity weakness or numbness.   Migraine    Past Medical History:  Diagnosis Date   ADD (attention deficit disorder)    Anemia    Asthma    Bronchitis    Eczema    Heart palpitations    Hyperthyroidism    Insomnia    Migraine    Recurrent upper respiratory infection (URI)    Urticaria    Past Surgical History:  Procedure Laterality Date   ADENOIDECTOMY     NASAL SEPTOPLASTY W/ TURBINOPLASTY  2017   SINOSCOPY     TONSILLECTOMY     VAGINA RECONSTRUCTION SURGERY N/A      Home Medications Prior to Admission medications   Medication Sig Start Date End Date Taking? Authorizing Provider  prochlorperazine (COMPAZINE) 10 MG tablet Take 1 tablet (10 mg total) by mouth 2 (two) times daily as needed for nausea or vomiting. 01/15/23  Yes Rex Kras, PA  albuterol (PROVENTIL HFA;VENTOLIN HFA) 108 (90 Base) MCG/ACT inhaler Inhale 2 puffs into the lungs every 6 (six) hours as needed for wheezing or shortness of breath. Patient not taking: Reported on 01/06/2023    [provider]  Butalbital-APAP-Caffeine 50-325-40 MG capsule Take 1-2 capsules by mouth every 6 (six) hours as needed for headache. Patient not taking: Reported on 01/06/2023 05/03/21   Jaclyn Prime, Collene Leyden, PA-C  citalopram (CELEXA) 20 MG tablet Take 20 mg by mouth at  bedtime. Patient not taking: Reported on 01/06/2023 04/26/22   [provider]  escitalopram (LEXAPRO) 20 MG tablet Take 20 mg by mouth daily. Patient not taking: Reported on 01/06/2023    [provider]  fluconazole (DIFLUCAN) 150 MG tablet Take 1 tablet (150 mg total) by mouth once as needed for up to 1 dose (vaginal itching). 01/06/23   Seabron Spates, CNM  ibuprofen (ADVIL) 800 MG tablet Take 800 mg by mouth every 8 (eight) hours as needed. Patient not taking: Reported on 01/06/2023    [provider]  norethindrone (MICRONOR) 0.35 MG tablet Take 1 tablet (0.35 mg total) by mouth daily. Patient not taking: Reported on 01/06/2023 01/21/22   Gavin Pound, CNM  Prenatal Vit-Fe Fumarate-FA (PRENATAL MULTIVITAMIN) TABS tablet Take 1 tablet by mouth daily at 12 noon. Patient not taking: Reported on 01/06/2023    [provider]  propranolol (INDERAL) 40 MG tablet Take 1 tablet (40 mg total) by mouth 2 (two) times daily. Patient not taking: Reported on 01/06/2023 09/12/21   Berniece Salines, DO      Allergies    Latex    Review of Systems   Review of Systems Negative except as per HPI.  Physical Exam Updated Vital Signs BP 112/75   Pulse 70   Temp 98.4 F (36.9 C) (Oral)   Resp 18   Ht '5\' 10"'$  (1.778 m)  Wt 76.2 kg   LMP 01/13/2023 (Exact Date)   SpO2 95%   BMI 24.10 kg/m  Physical Exam Vitals and nursing note reviewed.  Constitutional:      Appearance: Normal appearance.  HENT:     Head: Normocephalic and atraumatic.     Mouth/Throat:     Mouth: Mucous membranes are moist.  Eyes:     General: No scleral icterus. Cardiovascular:     Rate and Rhythm: Normal rate and regular rhythm.     Pulses: Normal pulses.     Heart sounds: Normal heart sounds.  Pulmonary:     Effort: Pulmonary effort is normal.     Breath sounds: Normal breath sounds.  Abdominal:     General: Abdomen is flat.     Palpations: Abdomen is soft.     Tenderness: There is no  abdominal tenderness.  Musculoskeletal:        General: No deformity.  Skin:    General: Skin is warm.     Findings: No rash.  Neurological:     General: No focal deficit present.     Mental Status: She is alert.     Comments: Cranial nerves II through XII intact. Intact sensation to light touch in all 4 extremities. 5/5 strength in all 4 extremities. Intact finger-to-nose and heel-to-shin of all 4 extremities. No visual field cuts. No neglect noted. No aphasia noted.   Psychiatric:        Mood and Affect: Mood normal.     ED Results / Procedures / Treatments   Labs (all labs ordered are listed, but only abnormal results are displayed) Labs Reviewed - No data to display  EKG None  Radiology No results found.  Procedures Procedures    Medications Ordered in ED Medications  ketorolac (TORADOL) 15 MG/ML injection 15 mg (15 mg Intravenous Given 01/15/23 2202)  metoCLOPramide (REGLAN) injection 10 mg (10 mg Intravenous Given 01/15/23 2201)  diphenhydrAMINE (BENADRYL) injection 25 mg (25 mg Intravenous Given 01/15/23 2159)  sodium chloride 0.9 % bolus 1,000 mL (1,000 mLs Intravenous New Bag/Given 01/15/23 2203)    ED Course/ Medical Decision Making/ A&P                             Medical Decision Making Risk Prescription drug management.   This patient presents to the ED for headache, this involves an extensive number of treatment options, and is a complaint that carries with a high risk of complications and morbidity.  The differential diagnosis includes cluster headache, migraine, sinusitis, tension headache, encephalitis, encephalopathy, meningitis, mass, pseudotumor, subarachnoid hemorrhage, temporal arteritis/giant cell arteritis, traumatic intracranial hemorrhage.  This is not an exhaustive list.  Problem list/ ED course/ Critical interventions/ Medical management: HPI: See above Vital signs within normal range and stable throughout visit. Laboratory/imaging studies  significant for: See above. On physical examination, patient is afebrile and appears in no acute distress. This patient presents with a headache most consistent with benign headache from either tension type headache vs migraine. No headache red flags. Neurologic exam without evidence of meningismus, AMS, focal neurologic findings so doubt meningitis, encephalitis, stroke. Presentation not consistent with acute intracranial bleed to include SAH (lack of risk factors, headache history). No history of trauma so doubt ICH. Given history and physical temporal arteritis unlikely, as is acute angle closure glaucoma. Doubt carotid artery dissection given no focal neuro deficits, no neck trauma or recent neck strain. Patient with no signs of  increased intracranial pressure or weight loss and history and physical suggest more benign headache so less likely mass effect in brain from tumor or abscess or idiopathic intracranial hypertension. Pain was controlled with headache cocktail and patient discharged home with PCP follow up. I have reviewed the patient home medicines and have made adjustments as needed.  Cardiac monitoring/EKG: The patient was maintained on a cardiac monitor.  I personally reviewed and interpreted the cardiac monitor which showed an underlying rhythm of: sinus rhythm.  Additional history obtained: External records from outside source obtained and reviewed including: Chart review including previous notes, labs, imaging.  Disposition Continued outpatient therapy. Follow-up with PCP recommended for reevaluation of symptoms. Treatment plan discussed with patient.  Pt acknowledged understanding was agreeable to the plan. Worrisome signs and symptoms were discussed with patient, and patient acknowledged understanding to return to the ED if they noticed these signs and symptoms. Patient was stable upon discharge.   This chart was dictated using voice recognition software.  Despite best efforts to  proofread,  errors can occur which can change the documentation meaning.          Final Clinical Impression(s) / ED Diagnoses Final diagnoses:  Other migraine without status migrainosus, not intractable    Rx / DC Orders ED Discharge Orders          Ordered    prochlorperazine (COMPAZINE) 10 MG tablet  2 times daily PRN        01/15/23 2327              Rex Kras, PA 01/15/23 2354    Lajean Saver, MD 01/16/23 2145

## 2023-01-15 NOTE — ED Notes (Signed)
Pt c/o HA since last night.  Has hx of migraines and her meds are not working

## 2023-01-15 NOTE — Discharge Instructions (Signed)
Please take your medications as prescribed. Take tylenol/ibuprofen for pain. I recommend close follow-up with neurology for reevaluation.  Please do not hesitate to return to emergency department if worrisome signs symptoms we discussed become apparent.

## 2023-01-15 NOTE — ED Triage Notes (Signed)
[facility] pt has had a Headache throughout the day, has not eaten or drank today Pt states also did not take migraine meds today  Reports N/V associated with headache   H/o migraines

## 2023-01-15 NOTE — Telephone Encounter (Signed)
  Chief Complaint: Headache Symptoms: 9/10 headache, dizziness, spinning, nausea Frequency: Today Pertinent Negatives: Patient denies  Disposition: [x]$ ED /[]$ Urgent Care (no appt availability in office) / []$ Appointment(In office/virtual)/ []$  [facility]/ []$ Home Care/ []$ Refused Recommended Disposition /[]$ [facility]/ []$  Follow-up with PCP Additional Notes: States had called "[facility], had a gas leak today."  States was advised what to do with home. Advised ED for pt, sounds distressed, states will follow disposition. Advised all those in household should leave home for now. Others have no symptoms presently.  Reason for Disposition  [1] Other family members (or people in same household) with headaches AND [2] possibility of carbon monoxide exposure  Answer Assessment - Initial Assessment Questions 1. LOCATION: "Where does it hurt?"      NA 2. ONSET: "When did the headache start?" (Minutes, hours or days)      Last night 3. PATTERN: "Does the pain come and go, or has it been constant since it started?"     Constant 4. SEVERITY: "How bad is the pain?" and "What does it keep you from doing?"  (e.g., Scale 1-10; mild, moderate, or severe)   - MILD (1-3): doesn't interfere with normal activities    - MODERATE (4-7): interferes with normal activities or awakens from sleep    - SEVERE (8-10): excruciating pain, unable to do any normal activities        9/10 5. RECURRENT SYMPTOM: "Have you ever had headaches before?" If Yes, ask: "When was the last time?" and "What happened that time?"       6. CAUSE: "What do you think is causing the headache?"      7. MIGRAINE: "Have you been diagnosed with migraine headaches?" If Yes, ask: "Is this headache similar?"       8. HEAD INJURY: "Has there been any recent injury to the head?"       9. OTHER SYMPTOMS: "Do you have any other symptoms?" (fever, stiff neck, eye pain, sore throat, cold symptoms)     Dizzy, spinning, nausea. Eyes,  vision blurry  Protocols used: Headache-A-AH

## 2023-02-12 ENCOUNTER — Other Ambulatory Visit: Payer: Self-pay | Admitting: Family Medicine

## 2023-02-12 MED ORDER — CYCLOBENZAPRINE HCL 10 MG PO TABS: 10.0000 mg | ORAL_TABLET | Freq: Three times a day (TID) | ORAL | 0 refills | Status: DC | PRN

## 2023-02-12 MED ORDER — BUTALBITAL-APAP-CAFFEINE 50-325-40 MG PO CAPS: 1.0000 | ORAL_CAPSULE | Freq: Four times a day (QID) | ORAL | 0 refills | Status: DC | PRN

## 2023-02-13 ENCOUNTER — Other Ambulatory Visit: Payer: Self-pay | Admitting: *Deleted

## 2023-02-13 MED ORDER — PROPRANOLOL HCL 40 MG PO TABS: 40.0000 mg | ORAL_TABLET | Freq: Two times a day (BID) | ORAL | 1 refills | Status: DC

## 2023-03-11 ENCOUNTER — Ambulatory Visit (INDEPENDENT_AMBULATORY_CARE_PROVIDER_SITE_OTHER): Payer: Medicaid Other | Admitting: Obstetrics & Gynecology

## 2023-03-11 ENCOUNTER — Other Ambulatory Visit (HOSPITAL_COMMUNITY)
Admission: RE | Admit: 2023-03-11 | Discharge: 2023-03-11 | Disposition: A | Discharge status: Home / Self Care | Payer: Medicaid Other | Source: Ambulatory Visit | Attending: Obstetrics & Gynecology | Admitting: Obstetrics & Gynecology

## 2023-03-11 VITALS — BP 116/80 | HR 75 | Ht 69.0 in | Wt 169.0 lb

## 2023-03-11 DIAGNOSIS — Z01419 Encounter for gynecological examination (general) (routine) without abnormal findings: Secondary | ICD-10-CM | POA: Insufficient documentation

## 2023-03-11 DIAGNOSIS — R102 Pelvic and perineal pain: Secondary | ICD-10-CM

## 2023-03-11 NOTE — Progress Notes (Signed)
Subjective:     Carla [name] is a 32 y.o. female here for a routine exam.  Current complaints: pt reports some new onset pelvic pain. No abnormal bleeding. No new sexual partners.  NO abnormal discharge.       Gynecologic History No LMP recorded. Contraception: abstinence; has POPs at home no ttaking currently.  Last Pap: 01/22/2022. Results were: ASCUS neg hrHPV Last mammogram: n/a.   Obstetric History OB History  Gravida Para Term Preterm AB Living  5 3 3   2 3   SAB IAB Ectopic Multiple Live Births  1 1   0 3    # Outcome Date GA Lbr Len/2nd Weight Sex Delivery Anes PTL Lv  5 Term 07/04/21 [redacted]w[redacted]d / 00:11 5 lb 1.3 oz (2.305 kg) F Vag-Spont None  LIV  4 SAB 07/2020          3 IAB 04/2020          2 Term 06/18/11    M Vag-Spont   LIV  1 Term 11/27/09    F Vag-Spont   LIV    The following portions of the patient's history were reviewed and updated as appropriate: allergies, current medications, past family history, past medical history, past social history, past surgical history, and problem list.  Review of Systems Pertinent items are noted in HPI.    Objective:  BP 116/80   Pulse 75   Ht 5\' 9"  (1.753 m)   Wt 169 lb (76.7 kg)   BMI 24.96 kg/m   General Appearance:    Alert, cooperative, no distress, appears stated age  Head:    Normocephalic, without obvious abnormality, atraumatic  Eyes:    conjunctiva/corneas clear, EOM's intact, both eyes  Ears:    Normal external ear canals, both ears  Nose:   Nares normal, septum midline, mucosa normal, no drainage    or sinus tenderness  Throat:   Lips, mucosa, and tongue normal; teeth and gums normal  Neck:   Supple, symmetrical, trachea midline, no adenopathy;    thyroid:  no enlargement/tenderness/nodules  Back:     Symmetric, no curvature, ROM normal, no CVA tenderness  Lungs:     respirations unlabored  Chest Wall:    No tenderness or deformity   Heart:    Regular rate and rhythm  Breast Exam:    No tenderness,  masses, or nipple abnormality  Abdomen:     Soft, non-tender, bowel sounds active all four quadrants,    no masses, no organomegaly  Genitalia:    Normal female without lesion, discharge or tenderness   Mild CMT; no adnexal tenderness or masses  Extremities:   Extremities normal, atraumatic, no cyanosis or edema  Pulses:   2+ and symmetric all extremities  Skin:   Skin color, texture, turgor normal, no rashes or lesions    Assessment:    Healthy female exam.  Pelvic tenderness- cx sent.    Plan:  Danita was seen today for gynecologic exam.  Diagnoses and all orders for this visit:  Well female exam with routine gynecological exam -     Cytology - PAP( [facility])  Pelvic pain in female   F/u in 1 year or sooner prn   Hashim Eichhorst L. Harraway-Smith, M.D., [facility]

## 2023-03-18 LAB — CYTOLOGY - PAP
Chlamydia: NEGATIVE
Comment: NEGATIVE
Comment: NEGATIVE
Comment: NORMAL
Diagnosis: UNDETERMINED — AB
High risk HPV: NEGATIVE
Neisseria Gonorrhea: NEGATIVE

## 2023-03-25 ENCOUNTER — Telehealth: Payer: Self-pay

## 2023-03-25 ENCOUNTER — Other Ambulatory Visit: Payer: Self-pay | Admitting: Obstetrics & Gynecology

## 2023-03-25 DIAGNOSIS — Z308 Encounter for other contraceptive management: Secondary | ICD-10-CM

## 2023-03-25 MED ORDER — NORETHINDRONE ACET-ETHINYL EST 1-20 MG-MCG PO TABS: 1.0000 | ORAL_TABLET | Freq: Every day | ORAL | 11 refills | Status: DC

## 2023-03-25 NOTE — Telephone Encounter (Signed)
-----   Message from Willodean Rosenthal, MD sent at 03/25/2023  3:28 PM EDT ----- Regarding: RE: Birth control pills Done. Please let her know to start the Sunday AFTER her next menses.   Clh-S  ----- Message ----- From: Mikey Bussing, CMA Sent: 03/24/2023   8:32 AM EDT To: Willodean Rosenthal, MD Subject: Birth control pills                            Good morning, this patient would like to switch to birth control pills with Estrogen because she is no longer breast feeding.

## 2023-03-25 NOTE — Telephone Encounter (Signed)
Called patient to advise her to start her birth control pills the Sunday after her period. Understanding was voiced. Carla [name] l Carla [name], CMA

## 2023-04-22 ENCOUNTER — Ambulatory Visit (INDEPENDENT_AMBULATORY_CARE_PROVIDER_SITE_OTHER): Payer: Medicaid Other | Admitting: Obstetrics & Gynecology

## 2023-04-22 ENCOUNTER — Other Ambulatory Visit (HOSPITAL_COMMUNITY)
Admission: RE | Admit: 2023-04-22 | Discharge: 2023-04-22 | Disposition: A | Discharge status: Home / Self Care | Payer: Medicaid Other | Source: Ambulatory Visit | Attending: Obstetrics & Gynecology | Admitting: Obstetrics & Gynecology

## 2023-04-22 ENCOUNTER — Encounter: Payer: Self-pay | Admitting: Obstetrics & Gynecology

## 2023-04-22 VITALS — BP 115/62 | HR 86 | Wt 165.0 lb

## 2023-04-22 DIAGNOSIS — Z113 Encounter for screening for infections with a predominantly sexual mode of transmission: Secondary | ICD-10-CM

## 2023-04-22 DIAGNOSIS — Z30430 Encounter for insertion of intrauterine contraceptive device: Secondary | ICD-10-CM | POA: Diagnosis not present

## 2023-04-22 DIAGNOSIS — N898 Other specified noninflammatory disorders of vagina: Secondary | ICD-10-CM

## 2023-04-22 DIAGNOSIS — Z01812 Encounter for preprocedural laboratory examination: Secondary | ICD-10-CM

## 2023-04-22 LAB — POCT URINE PREGNANCY: Preg Test, Ur: NEGATIVE

## 2023-04-22 MED ORDER — LEVONORGESTREL 20 MCG/DAY IU IUD
1.0000 | INTRAUTERINE_SYSTEM | Freq: Once | INTRAUTERINE | Status: AC
  Administered 2023-04-22: 1 via INTRAUTERINE

## 2023-04-22 NOTE — Progress Notes (Signed)
Patient desire STD testing, BV, and Yeast. Armandina Stammer RN

## 2023-04-22 NOTE — Progress Notes (Signed)
GYNECOLOGY CLINIC PROCEDURE NOTE  Carla [name] is a 32 y.o. [phone] here for Mirena IUD insertion. No GYN concerns.  Last pap smear was on 03/11/2023 and was ASCUS with neg hrHPV.  IUD Insertion Procedure Note Patient identified, informed consent performed.  Discussed risks of irregular bleeding, cramping, infection, malpositioning or misplacement of the IUD outside the uterus which may require further procedures. Time out was performed.  Urine pregnancy test negative.  Speculum placed in the vagina.  Cervix visualized.  Cleaned with Betadine x 2.  Grasped anteriorly with a single tooth tenaculum.  Uterus sounded to 10 cm.  Mirena IUD placed per manufacturer's recommendations.  Strings trimmed to 3 cm. Tenaculum was removed, good hemostasis noted.  Patient tolerated procedure well.   Patient was given post-procedure instructions.  Patient was asked to follow up in 4 weeks for IUD check. All questions answered.   Gil Ingwersen L. Harraway-Smith, M.D., [facility]

## 2023-04-23 LAB — CERVICOVAGINAL ANCILLARY ONLY
Bacterial Vaginitis (gardnerella): NEGATIVE
Candida Glabrata: NEGATIVE
Candida Vaginitis: NEGATIVE
Chlamydia: NEGATIVE
Comment: NEGATIVE
Comment: NEGATIVE
Comment: NEGATIVE
Comment: NEGATIVE
Comment: NEGATIVE
Comment: NORMAL
Neisseria Gonorrhea: NEGATIVE
Trichomonas: NEGATIVE

## 2023-05-05 NOTE — Progress Notes (Deleted)
Patient: Carla [name] Date of Birth: Jan 02, 1991  Reason for Visit: Follow up History from: Patient, children Primary Neurologist: Dr. Terrace Arabia   ASSESSMENT AND PLAN 32 y.o. 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, ophthalmology evaluation showed nasal disc margins slightly blurry, no spontaneous venous pulsations  -Check MRI of the brain with and without contrast, if unremarkable consider proceeding with LP to determine opening pressure -Continues breast-feeding, limited on medication options, she is on propranolol from cardiology; with upcoming MRI, we discussed discarding breast milk for the 1st 24 hours, but will discuss when MRI is performed see below:  From [employer]: However, guidelines developed by several [employer] state that breastfeeding need not be disrupted after a nursing mother receives a gadolinium-containing contrast medium.[1-3] Other agents may be preferred, especially while nursing a newborn or preterm infant.  HISTORY Carla [name] is a 32 year old female, seen in request by her primary care physician Dr. Willodean Rosenthal, for evaluation of chronic migraine headaches, initial evaluation was August 27, 2021.   I reviewed and summarized the referring note. PMHx. HTN   She reported history of chronic migraines since 32 years old, gradually increased frequency, during her most recent pregnancy in 2022, she had almost daily headaches, also complains of blurry vision, despite a new pair of glasses in summer of 2021.  Pending ophthalmology evaluation in October 2022   She was seen by headache specialist a few month ago, was put on Fioricet, she is taking daily, but despite that, she complains daily holoacranial retro-orbital area mild to moderate pressure headaches, only temporarily relieved by Fioricet  She denies lateralized motor or sensory deficit,  had a history of chronic neck, upper back, low back pain, during the pregnancy, complains of intermittent shooting pain to bilateral lower extremity, improved after delivery   Update 05/07/2022 SS: here for VV, still headaches daily, are improved in severity. Are temporally, occipitally, feels pressure pain/squeezing pain, has been for several years. Had baby 07/04/21, little girl, still breast feeding. Dr. Terrace Arabia had started Inderal 80 mg BID, cardiology reduced to 40 mg BID due to low BP. Recent BP 137/65. Did see eye doctor, records not available to me. Headaches are more the back of head, radiating down the neck. Rarely muffled hearing. Takes ibuprofen 800 mg BID for headaches, also for inflammation around heart. Takes Flexeril for neck pain.  Update 06/19/2022 SS: Saw Dr. 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 VF were full.  Continues with daily headache, frontal, retro-orbital, occipital.  Worsens with exercise, has ringing in the ears, swooshing.  Sensitive to light, sees sparkles.  Difficulty with nighttime driving.  Is almost 1 year postpartum.  Her headache presentation worsened about 2 years ago.  Recently started on progesterone birth control.  Here today with her 3 kids.  She is breast-feeding. Wearing Zio patch today.   Update May 06, 2023 SS:   REVIEW OF SYSTEMS: Out of a complete 14 system review of symptoms, the patient complains only of the following symptoms, and all other reviewed systems are negative.  See HPI  ALLERGIES: Allergies  Allergen Reactions   Latex Hives    HOME MEDICATIONS: Outpatient Medications Prior to Visit  Medication Sig Dispense Refill   Butalbital-APAP-Caffeine 50-325-40 MG capsule Take 1-2 capsules by mouth every 6 (six) hours as needed for headache. 30 capsule  0   citalopram (CELEXA) 20 MG tablet Take 20 mg by mouth at bedtime. (Patient not  taking: Reported on 01/06/2023)     cyclobenzaprine (FLEXERIL) 10 MG tablet Take 1 tablet (10 mg total) by mouth 3 (three) times daily as needed (headache). 30 tablet 0   escitalopram (LEXAPRO) 20 MG tablet Take 20 mg by mouth daily.     ibuprofen (ADVIL) 800 MG tablet Take 800 mg by mouth every 8 (eight) hours as needed.     norethindrone-ethinyl estradiol (LOESTRIN 1/20, 21,) 1-20 MG-MCG tablet Take 1 tablet by mouth daily. 28 tablet 11   propranolol (INDERAL) 40 MG tablet Take 1 tablet (40 mg total) by mouth 2 (two) times daily. 180 tablet 1   No facility-administered medications prior to visit.    PAST MEDICAL HISTORY: Past Medical History:  Diagnosis Date   ADD (attention deficit disorder)    Anemia    Asthma    Bronchitis    Eczema    Heart palpitations    Hyperthyroidism    Insomnia    Migraine    Recurrent upper respiratory infection (URI)    Urticaria     PAST SURGICAL HISTORY: Past Surgical History:  Procedure Laterality Date   ADENOIDECTOMY     NASAL SEPTOPLASTY W/ TURBINOPLASTY  2017   SINOSCOPY     TONSILLECTOMY     VAGINA RECONSTRUCTION SURGERY N/A     FAMILY HISTORY: Family History  Problem Relation Age of Onset   Hypertension Mother    Migraines Mother    Hypertension Father    Diabetes Father    Migraines Father    Diabetes Brother    Migraines Brother    Diabetes Maternal Grandmother    Stroke Maternal Grandmother    Migraines Maternal Grandmother    Diabetes Paternal Grandfather    Heart disease Neg Hx     SOCIAL HISTORY: Social History   Socioeconomic History   Marital status: Single    Spouse name: Not on file   Number of children: 2   Years of education: Not on file   Highest education level: Some college, no degree  Occupational History   Not on file  Tobacco Use   Smoking status: Former    Packs/day: 0.25    Years: 4.00    Additional pack years: 0.00    Total pack years: 1.00    Types: Cigarettes    Quit date: 08/18/2018     Years since quitting: 4.7   Smokeless tobacco: Never   Tobacco comments:    patient quit 2 weeks ago. 09-01-18  Vaping Use   Vaping Use: Never used  Substance and Sexual Activity   Alcohol use: Never   Drug use: Never   Sexual activity: Yes    Birth control/protection: Injection  Other Topics Concern   Not on file  Social History Narrative   Not on file   Social Determinants of Health   Financial Resource Strain: Not on file  Food Insecurity: No Food Insecurity (06/19/2022)   Hunger Vital Sign    Worried About Running Out of Food in the Last Year: Never true    Ran Out of Food in the Last Year: Never true  Transportation Needs: No Transportation Needs (06/19/2022)   PRAPARE - Administrator, Civil Service (Medical): No    Lack of Transportation (Non-Medical): No  Physical Activity: Not on file  Stress: Not on file  Social Connections: Not on file  Intimate Partner Violence: Not  on file    PHYSICAL EXAM  There were no vitals filed for this visit.  There is no height or weight on file to calculate BMI.  Generalized: Well developed, in no acute distress  Neurological examination  Mentation: Alert oriented to time, place, history taking. Follows all commands speech and language fluent Cranial nerve II-XII: Pupils were equal round reactive to light. Extraocular movements were full, visual field were full on confrontational test. Facial sensation and strength were normal.  Head turning and shoulder shrug  were normal and symmetric. Motor: The motor testing reveals 5 over 5 strength of all 4 extremities. Good symmetric motor tone is noted throughout.  Sensory: Sensory testing is intact to soft touch on all 4 extremities. No evidence of extinction is noted.  Coordination: Cerebellar testing reveals good finger-nose-finger and heel-to-shin bilaterally.  Gait and station: Gait is normal. Tandem gait is normal.   Reflexes: Deep tendon reflexes are symmetric and normal  bilaterally.   DIAGNOSTIC DATA (LABS, IMAGING, TESTING) - I reviewed patient records, labs, notes, testing and imaging myself where available.  Lab Results  Component Value Date   WBC 7.5 07/04/2021   HGB 10.0 (L) 07/04/2021   HCT 31.9 (L) 07/04/2021   MCV 86.2 07/04/2021   PLT 272 07/04/2021      Component Value Date/Time   NA 141 06/19/2022 1407   K 4.0 06/19/2022 1407   CL 102 06/19/2022 1407   CO2 25 06/19/2022 1407   GLUCOSE 77 06/19/2022 1407   GLUCOSE 77 07/04/2021 1021   BUN 7 06/19/2022 1407   CREATININE 0.72 06/19/2022 1407   CALCIUM 9.2 06/19/2022 1407   PROT 6.0 (L) 07/04/2021 1021   PROT 6.0 01/22/2021 0926   ALBUMIN 2.7 (L) 07/04/2021 1021   ALBUMIN 3.9 01/22/2021 0926   AST 22 07/04/2021 1021   ALT 17 07/04/2021 1021   ALKPHOS 129 (H) 07/04/2021 1021   BILITOT 0.2 (L) 07/04/2021 1021   BILITOT <0.2 01/22/2021 0926   GFRNONAA >60 07/04/2021 1021   No results found for: "CHOL", "HDL", "LDLCALC", "LDLDIRECT", "TRIG", "CHOLHDL" No results found for: "HGBA1C" Lab Results  Component Value Date   VITAMINB12 567 03/15/2021   Lab Results  Component Value Date   TSH 0.540 01/06/2023    Margie Ege, AGNP-C, DNP 05/05/2023, 3:08 PM [facility] [address], [address] [place], [place] [address] [phone]

## 2023-05-06 ENCOUNTER — Ambulatory Visit: Payer: Medicaid Other | Admitting: Neurology

## 2023-05-06 ENCOUNTER — Encounter: Payer: Self-pay | Admitting: Neurology

## 2023-05-20 ENCOUNTER — Ambulatory Visit: Payer: Medicaid Other | Admitting: Obstetrics & Gynecology

## 2023-05-20 ENCOUNTER — Encounter: Payer: Self-pay | Admitting: Obstetrics & Gynecology

## 2023-05-20 VITALS — BP 99/64 | HR 82 | Ht 69.0 in | Wt 165.0 lb

## 2023-05-20 DIAGNOSIS — Z30431 Encounter for routine checking of intrauterine contraceptive device: Secondary | ICD-10-CM

## 2023-05-20 DIAGNOSIS — T8384XA Pain from genitourinary prosthetic devices, implants and grafts, initial encounter: Secondary | ICD-10-CM

## 2023-05-20 MED ORDER — TRAMADOL HCL 50 MG PO TABS: 50.0000 mg | ORAL_TABLET | Freq: Four times a day (QID) | ORAL | 0 refills | Status: DC | PRN

## 2023-05-20 MED ORDER — DICLOFENAC POTASSIUM 50 MG PO TABS: 50.0000 mg | ORAL_TABLET | Freq: Three times a day (TID) | ORAL | 0 refills | Status: DC

## 2023-05-20 NOTE — Progress Notes (Signed)
  GYNECOLOGY OFFICE ENCOUNTER NOTE  History:  32 y.o. [id number] here today for today for IUD string check; Mirena  IUD was placed  04/22/2023. Pt reports pain and bleeding daily. The OTC NSAIDS are not helping with the pain. It is not severe but, it is activity limiting. The bleeding is light but continuous.    The following portions of the patient's history were reviewed and updated as appropriate: allergies, current medications, past family history, past medical history, past social history, past surgical history and problem list.   Review of Systems:  Pertinent items are noted in HPI.   Objective:  Physical Exam Blood pressure 99/64, pulse 82, height 5\' 9"  (1.753 m), weight 165 lb (74.8 kg), not currently breastfeeding. CONSTITUTIONAL: Well-developed, well-nourished female in no acute distress.  HENT:  Normocephalic, atraumatic. External right and left ear normal. Oropharynx is clear and moist EYES: Conjunctivae and EOM are normal. Pupils are equal, round, and reactive to light. No scleral icterus.  NECK: Normal range of motion, supple, no masses CARDIOVASCULAR: Normal heart rate noted RESPIRATORY: Effort and breath sounds normal, no problems with respiration noted ABDOMEN: Soft, no distention noted.   PELVIC: Normal appearing external genitalia; normal appearing vaginal mucosa and cervix.  IUD strings visualized, about 3 cm in length outside cervix.   Assessment & Plan:  Patient to keep IUD in place for up to seven years; can come in for removal if she desires pregnancy earlier or for any concerning side effects. Keiana was seen today for string check.  Diagnoses and all orders for this visit:  Pain due to intrauterine contraceptive device (IUD), initial encounter (HCC) -     traMADol (ULTRAM) 50 MG tablet; Take 1 tablet (50 mg total) by mouth every 6 (six) hours as needed for severe pain. -     diclofenac (CATAFLAM) 50 MG tablet; Take 1 tablet (50 mg total) by mouth 3 (three) times  daily. For severe pain may take 2 tablets. DO NOT exceed 4 tabs per day.  IUD check up   F/u in 2 months or sooner prn   Feiga Nadel L. Harraway-Smith, MD, FACOG Obstetrician & Gynecologist, [facility] for [employer], [facility]

## 2023-05-20 NOTE — Progress Notes (Signed)
Pt is having cramping and bleeding daily

## 2023-07-13 ENCOUNTER — Encounter: Payer: Self-pay | Admitting: Neurology

## 2023-07-13 ENCOUNTER — Ambulatory Visit: Payer: Medicaid Other | Admitting: Neurology

## 2023-07-13 ENCOUNTER — Telehealth: Payer: Self-pay | Admitting: Neurology

## 2023-07-13 VITALS — BP 128/72 | Ht 70.0 in | Wt 169.0 lb

## 2023-07-13 DIAGNOSIS — M542 Cervicalgia: Secondary | ICD-10-CM

## 2023-07-13 DIAGNOSIS — G932 Benign intracranial hypertension: Secondary | ICD-10-CM

## 2023-07-13 DIAGNOSIS — G43709 Chronic migraine without aura, not intractable, without status migrainosus: Secondary | ICD-10-CM | POA: Diagnosis not present

## 2023-07-13 MED ORDER — LAMOTRIGINE 25 MG PO TABS: ORAL_TABLET | ORAL | 11 refills | Status: DC

## 2023-07-13 MED ORDER — TIZANIDINE HCL 4 MG PO TABS: 4.0000 mg | ORAL_TABLET | Freq: Four times a day (QID) | ORAL | 6 refills | Status: DC | PRN

## 2023-07-13 MED ORDER — RIZATRIPTAN BENZOATE 10 MG PO TBDP: 10.0000 mg | ORAL_TABLET | ORAL | 6 refills | Status: DC | PRN

## 2023-07-13 MED ORDER — ONDANSETRON HCL 4 MG PO TABS: 4.0000 mg | ORAL_TABLET | Freq: Three times a day (TID) | ORAL | 6 refills | Status: DC | PRN

## 2023-07-13 MED ORDER — AIMOVIG 70 MG/ML ~~LOC~~ SOAJ: 70.0000 mg | SUBCUTANEOUS | 11 refills | Status: DC
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
## 2023-07-13 NOTE — Telephone Encounter (Signed)
[facility] [place]: [id number] exp. 07/13/23-09/10/23 sent to GI [phone]

## 2023-07-29 ENCOUNTER — Ambulatory Visit: Payer: Medicaid Other | Admitting: Obstetrics & Gynecology

## 2023-07-30 ENCOUNTER — Ambulatory Visit: Payer: Medicaid Other | Attending: Neurology

## 2023-07-30 DIAGNOSIS — G8929 Other chronic pain: Secondary | ICD-10-CM | POA: Insufficient documentation

## 2023-07-30 DIAGNOSIS — R252 Cramp and spasm: Secondary | ICD-10-CM | POA: Insufficient documentation

## 2023-07-30 DIAGNOSIS — M25611 Stiffness of right shoulder, not elsewhere classified: Secondary | ICD-10-CM | POA: Insufficient documentation

## 2023-07-30 DIAGNOSIS — M542 Cervicalgia: Secondary | ICD-10-CM | POA: Insufficient documentation

## 2023-07-30 DIAGNOSIS — M6281 Muscle weakness (generalized): Secondary | ICD-10-CM | POA: Insufficient documentation

## 2023-07-30 DIAGNOSIS — M25511 Pain in right shoulder: Secondary | ICD-10-CM | POA: Insufficient documentation

## 2023-07-31 ENCOUNTER — Other Ambulatory Visit (HOSPITAL_COMMUNITY): Payer: Self-pay

## 2023-07-31 ENCOUNTER — Telehealth: Payer: Self-pay

## 2023-07-31 NOTE — Telephone Encounter (Signed)
*  [facility]  Pharmacy Patient Advocate Encounter  Received notification from [facility] that Prior Authorization for Aimovig 70MG /ML auto-injectors  has been APPROVED from 07/31/2023 to 10/28/2023. Ran test claim, Copay is $4.00. This test claim was processed through [facility]- copay amounts may vary at other pharmacies due to pharmacy/plan contracts, or as the patient moves through the different stages of their insurance plan.   PA #/Case ID/Reference #: [id number] [id number]

## 2023-08-05 ENCOUNTER — Encounter: Payer: Self-pay | Admitting: Obstetrics & Gynecology

## 2023-08-05 ENCOUNTER — Ambulatory Visit: Payer: Medicaid Other | Admitting: Obstetrics & Gynecology

## 2023-08-05 VITALS — BP 132/77 | HR 103 | Ht 69.75 in | Wt 160.0 lb

## 2023-08-05 DIAGNOSIS — Z30431 Encounter for routine checking of intrauterine contraceptive device: Secondary | ICD-10-CM

## 2023-08-05 NOTE — Progress Notes (Signed)
History:  32 y.o. [id number] here today for f/u of IUD which was placed 04/22/23. Pt initially had significant pain thought related to her IUD. She is here for a f/u. Pt reports that her pain has improved. She has menses monthly with some spotting mid cycle. She feels that the bleeding is expected. She does report some bloating and has lost some weight since the IUD insertion. This is not concerning for her.     The following portions of the patient's history were reviewed and updated as appropriate: allergies, current medications, past family history, past medical history, past social history, past surgical history and problem list.  Review of Systems:  Pertinent items are noted in HPI.    Objective:  Physical Exam Blood pressure 132/77, pulse (!) 103, height 5' 9.75" (1.772 m), weight 160 lb (72.6 kg).  CONSTITUTIONAL: Well-developed, well-nourished female in no acute distress.  HENT:  Normocephalic, atraumatic EYES: Conjunctivae and EOM are normal. No scleral icterus.  NECK: Normal range of motion SKIN: Skin is warm and dry. No rash noted. Not diaphoretic.No pallor. NEUROLGIC: Alert and oriented to person, place, and time. Normal coordination.  Pelvic: not done   Assessment & Plan:  Diagnoses and all orders for this visit:  IUD check up   F/u in 1 year or sooner prn   Windie Marasco L. Harraway-Smith, M.D., [place]

## 2023-08-05 NOTE — Progress Notes (Signed)
Patient still having about a full period each month with some extra days of bleeding between. Patient still feeling bloated. Armandina Stammer RN

## 2023-08-12 ENCOUNTER — Encounter: Payer: Self-pay | Admitting: Physical Therapy

## 2023-08-12 ENCOUNTER — Ambulatory Visit: Payer: Medicaid Other | Admitting: Physical Therapy

## 2023-08-12 DIAGNOSIS — M542 Cervicalgia: Secondary | ICD-10-CM | POA: Diagnosis present

## 2023-08-12 DIAGNOSIS — M6281 Muscle weakness (generalized): Secondary | ICD-10-CM | POA: Diagnosis present

## 2023-08-12 DIAGNOSIS — M25611 Stiffness of right shoulder, not elsewhere classified: Secondary | ICD-10-CM | POA: Diagnosis present

## 2023-08-12 DIAGNOSIS — M25511 Pain in right shoulder: Secondary | ICD-10-CM | POA: Diagnosis present

## 2023-08-12 DIAGNOSIS — R252 Cramp and spasm: Secondary | ICD-10-CM

## 2023-08-12 DIAGNOSIS — G8929 Other chronic pain: Secondary | ICD-10-CM
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
## 2023-08-15 ENCOUNTER — Other Ambulatory Visit: Payer: Medicaid Other

## 2023-08-17 ENCOUNTER — Ambulatory Visit
Admission: RE | Admit: 2023-08-17 | Discharge: 2023-08-17 | Disposition: A | Discharge status: Home / Self Care | Payer: Medicaid Other | Source: Ambulatory Visit | Attending: Neurology | Admitting: Neurology

## 2023-08-17 DIAGNOSIS — G932 Benign intracranial hypertension: Secondary | ICD-10-CM | POA: Diagnosis not present

## 2023-08-17 DIAGNOSIS — G43709 Chronic migraine without aura, not intractable, without status migrainosus: Secondary | ICD-10-CM
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
## 2023-08-19 ENCOUNTER — Ambulatory Visit: Payer: Medicaid Other | Admitting: Physical Therapy

## 2023-08-19 ENCOUNTER — Encounter: Payer: Self-pay | Admitting: Physical Therapy

## 2023-08-19 DIAGNOSIS — R252 Cramp and spasm: Secondary | ICD-10-CM

## 2023-08-19 DIAGNOSIS — M542 Cervicalgia: Secondary | ICD-10-CM

## 2023-08-19 DIAGNOSIS — M25611 Stiffness of right shoulder, not elsewhere classified: Secondary | ICD-10-CM

## 2023-08-19 DIAGNOSIS — M6281 Muscle weakness (generalized): Secondary | ICD-10-CM

## 2023-08-19 DIAGNOSIS — G8929 Other chronic pain: Secondary | ICD-10-CM

## 2023-08-21 ENCOUNTER — Other Ambulatory Visit: Payer: Self-pay | Admitting: Family Medicine

## 2023-08-21 MED ORDER — FLUCONAZOLE 150 MG PO TABS: 150.0000 mg | ORAL_TABLET | Freq: Once | ORAL | 0 refills | Status: AC

## 2023-08-21 NOTE — Progress Notes (Signed)
Notified by nurse line that patient is having symptoms of recurrent yeast infection. Rx sent for diflucan.

## 2023-08-24 ENCOUNTER — Telehealth: Payer: Self-pay

## 2023-08-24 ENCOUNTER — Other Ambulatory Visit: Payer: Self-pay

## 2023-08-24 ENCOUNTER — Ambulatory Visit: Payer: Medicaid Other | Admitting: Neurology

## 2023-08-24 DIAGNOSIS — B379 Candidiasis, unspecified: Secondary | ICD-10-CM

## 2023-08-24 MED ORDER — FLUCONAZOLE 150 MG PO TABS
150.0000 mg | ORAL_TABLET | Freq: Once | ORAL | 0 refills | Status: AC
Start: 2023-08-24 — End: 2023-08-24

## 2023-08-24 NOTE — Telephone Encounter (Signed)
Patient called with c/o vaginal yeast infection. Rx sent.

## 2023-08-25 ENCOUNTER — Ambulatory Visit: Payer: Medicaid Other | Attending: Neurology | Admitting: Physical Therapy

## 2023-08-25 ENCOUNTER — Encounter: Payer: Self-pay | Admitting: Physical Therapy

## 2023-08-25 DIAGNOSIS — M25511 Pain in right shoulder: Secondary | ICD-10-CM | POA: Insufficient documentation

## 2023-08-25 DIAGNOSIS — R252 Cramp and spasm: Secondary | ICD-10-CM | POA: Insufficient documentation

## 2023-08-25 DIAGNOSIS — R262 Difficulty in walking, not elsewhere classified: Secondary | ICD-10-CM | POA: Diagnosis present

## 2023-08-25 DIAGNOSIS — M5441 Lumbago with sciatica, right side: Secondary | ICD-10-CM | POA: Diagnosis present

## 2023-08-25 DIAGNOSIS — M5442 Lumbago with sciatica, left side: Secondary | ICD-10-CM | POA: Diagnosis present

## 2023-08-25 DIAGNOSIS — M542 Cervicalgia: Secondary | ICD-10-CM | POA: Insufficient documentation

## 2023-08-25 DIAGNOSIS — M6281 Muscle weakness (generalized): Secondary | ICD-10-CM | POA: Insufficient documentation

## 2023-08-25 DIAGNOSIS — M25611 Stiffness of right shoulder, not elsewhere classified: Secondary | ICD-10-CM | POA: Diagnosis present

## 2023-08-25 DIAGNOSIS — G8929 Other chronic pain: Secondary | ICD-10-CM | POA: Diagnosis present
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
## 2023-08-28 ENCOUNTER — Ambulatory Visit: Payer: Medicaid Other | Admitting: Physical Therapy

## 2023-08-28 ENCOUNTER — Encounter: Payer: Self-pay | Admitting: Physical Therapy

## 2023-08-28 DIAGNOSIS — G8929 Other chronic pain: Secondary | ICD-10-CM

## 2023-08-28 DIAGNOSIS — M542 Cervicalgia: Secondary | ICD-10-CM

## 2023-08-28 DIAGNOSIS — R252 Cramp and spasm: Secondary | ICD-10-CM

## 2023-08-28 DIAGNOSIS — M25611 Stiffness of right shoulder, not elsewhere classified: Secondary | ICD-10-CM

## 2023-08-28 DIAGNOSIS — M6281 Muscle weakness (generalized): Secondary | ICD-10-CM
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
## 2023-09-02 ENCOUNTER — Ambulatory Visit: Payer: Medicaid Other | Admitting: Physical Therapy

## 2023-09-02 ENCOUNTER — Encounter: Payer: Self-pay | Admitting: Physical Therapy

## 2023-09-02 DIAGNOSIS — R252 Cramp and spasm: Secondary | ICD-10-CM

## 2023-09-02 DIAGNOSIS — G8929 Other chronic pain: Secondary | ICD-10-CM

## 2023-09-02 DIAGNOSIS — M6281 Muscle weakness (generalized): Secondary | ICD-10-CM

## 2023-09-02 DIAGNOSIS — M542 Cervicalgia: Secondary | ICD-10-CM

## 2023-09-02 DIAGNOSIS — M25611 Stiffness of right shoulder, not elsewhere classified: Secondary | ICD-10-CM
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
## 2023-09-07 ENCOUNTER — Ambulatory Visit: Payer: Medicaid Other

## 2023-09-07 VITALS — BP 111/53 | HR 98 | Ht 69.0 in | Wt 160.0 lb

## 2023-09-07 DIAGNOSIS — Z758 Other problems related to medical facilities and other health care: Secondary | ICD-10-CM

## 2023-09-07 DIAGNOSIS — R82998 Other abnormal findings in urine: Secondary | ICD-10-CM

## 2023-09-07 DIAGNOSIS — Z113 Encounter for screening for infections with a predominantly sexual mode of transmission: Secondary | ICD-10-CM

## 2023-09-07 LAB — POCT URINALYSIS DIPSTICK
Bilirubin, UA: NEGATIVE
Blood, UA: NEGATIVE
Glucose, UA: NEGATIVE
Ketones, UA: NEGATIVE
Leukocytes, UA: NEGATIVE
Nitrite, UA: NEGATIVE
Protein, UA: NEGATIVE
Spec Grav, UA: 1.015 — NL (ref 1.010–1.025)
Urobilinogen, UA: 0.2 U/dL
pH, UA: 6.5 (ref 5.0–8.0)

## 2023-09-07 NOTE — Progress Notes (Cosign Needed Addendum)
SUBJECTIVE:  32 y.o. female who desires a STI screen and urine dip. Denies abnormal vaginal discharge, bleeding or significant pelvic pain. No UTI symptoms. Denies history of known exposure to STD.  No LMP recorded. (Menstrual status: IUD).  OBJECTIVE:  She appears well.   ASSESSMENT:  STI Screen  Negative Urine dip  PLAN:  Pt offered STI blood screening-ordered GC, chlamydia, and trichomonas probe sent to lab.  Treatment: To be determined once lab results are received.  Pt follow up as needed.

## 2023-09-07 NOTE — Addendum Note (Signed)
Addended by: Leola Brazil on: 09/07/2023 02:02 PM   Modules accepted: Orders

## 2023-09-08 LAB — HEPATITIS B SURFACE ANTIGEN: Hepatitis B Surface Ag: NEGATIVE

## 2023-09-08 LAB — CERVICOVAGINAL ANCILLARY ONLY
Chlamydia: NEGATIVE
Comment: NEGATIVE
Comment: NEGATIVE
Comment: NORMAL
Neisseria Gonorrhea: NEGATIVE
Trichomonas: NEGATIVE

## 2023-09-08 LAB — HEPATITIS C ANTIBODY: Hep C Virus Ab: NONREACTIVE

## 2023-09-08 LAB — RPR: RPR Ser Ql: NONREACTIVE

## 2023-09-08 LAB — HIV ANTIBODY (ROUTINE TESTING W REFLEX): HIV Screen 4th Generation wRfx: NONREACTIVE

## 2023-09-09 ENCOUNTER — Encounter: Payer: Self-pay | Admitting: Physical Therapy

## 2023-09-09 ENCOUNTER — Ambulatory Visit: Payer: Medicaid Other | Admitting: Physical Therapy

## 2023-09-09 DIAGNOSIS — M25611 Stiffness of right shoulder, not elsewhere classified: Secondary | ICD-10-CM

## 2023-09-09 DIAGNOSIS — M6281 Muscle weakness (generalized): Secondary | ICD-10-CM

## 2023-09-09 DIAGNOSIS — M542 Cervicalgia: Secondary | ICD-10-CM

## 2023-09-09 DIAGNOSIS — R252 Cramp and spasm: Secondary | ICD-10-CM

## 2023-09-09 DIAGNOSIS — G8929 Other chronic pain: Secondary | ICD-10-CM
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
## 2023-09-11 ENCOUNTER — Ambulatory Visit: Payer: Medicaid Other | Admitting: Physical Therapy

## 2023-09-15 ENCOUNTER — Encounter: Payer: Self-pay | Admitting: Physical Therapy

## 2023-09-15 ENCOUNTER — Ambulatory Visit: Payer: Medicaid Other | Admitting: Physical Therapy

## 2023-09-15 DIAGNOSIS — G8929 Other chronic pain: Secondary | ICD-10-CM

## 2023-09-15 DIAGNOSIS — M542 Cervicalgia: Secondary | ICD-10-CM

## 2023-09-15 DIAGNOSIS — M25611 Stiffness of right shoulder, not elsewhere classified: Secondary | ICD-10-CM

## 2023-09-15 DIAGNOSIS — R252 Cramp and spasm: Secondary | ICD-10-CM

## 2023-09-15 DIAGNOSIS — M6281 Muscle weakness (generalized): Secondary | ICD-10-CM
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
## 2023-09-18 ENCOUNTER — Ambulatory Visit: Payer: Medicaid Other | Admitting: Physical Therapy

## 2023-09-18 ENCOUNTER — Encounter: Payer: Self-pay | Admitting: Physical Therapy

## 2023-09-18 DIAGNOSIS — M6281 Muscle weakness (generalized): Secondary | ICD-10-CM

## 2023-09-18 DIAGNOSIS — M542 Cervicalgia: Secondary | ICD-10-CM

## 2023-09-18 DIAGNOSIS — M25611 Stiffness of right shoulder, not elsewhere classified: Secondary | ICD-10-CM

## 2023-09-18 DIAGNOSIS — G8929 Other chronic pain: Secondary | ICD-10-CM
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
## 2023-09-23 ENCOUNTER — Ambulatory Visit: Payer: Medicaid Other | Admitting: Obstetrics & Gynecology

## 2023-09-23 ENCOUNTER — Encounter: Payer: Self-pay | Admitting: Obstetrics & Gynecology

## 2023-09-23 VITALS — BP 121/65 | HR 92 | Ht 69.0 in | Wt 160.0 lb

## 2023-09-23 DIAGNOSIS — Z30431 Encounter for routine checking of intrauterine contraceptive device: Secondary | ICD-10-CM

## 2023-09-23 NOTE — Progress Notes (Signed)
History:  32 y.o. [id number] here today for IUD check. IUD placed 04/22/23. Pt reports that her partner feels the strings and feels them poking him. Pt has no sx.   The following portions of the patient's history were reviewed and updated as appropriate: allergies, current medications, past family history, past medical history, past social history, past surgical history and problem list.  Review of Systems:  Pertinent items are noted in HPI.    Objective:  Physical Exam Blood pressure 121/65, pulse 92, height 5\' 9"  (1.753 m), weight 160 lb (72.6 kg).  CONSTITUTIONAL: Well-developed, well-nourished female in no acute distress.  HENT:  Normocephalic, atraumatic EYES: Conjunctivae and EOM are normal. No scleral icterus.  NECK: Normal range of motion SKIN: Skin is warm and dry. No rash noted. Not diaphoretic.No pallor. NEUROLGIC: Alert and oriented to person, place, and time. Normal coordination.  Pelvic: Normal appearing external genitalia; normal appearing vaginal mucosa and cervix.  Normal discharge.  IUD strings noted and the strings are sticking straight out. These were trimmed.     Assessment & Plan:  IUD check.  Strings trimmmed F/u prn   Jasiel Belisle L. Harraway-Smith, M.D., [place] [facility]

## 2023-10-09 ENCOUNTER — Ambulatory Visit: Payer: Medicaid Other | Admitting: Physical Therapy

## 2023-10-13 ENCOUNTER — Ambulatory Visit: Payer: Medicaid Other | Attending: Neurology

## 2023-10-15 ENCOUNTER — Ambulatory Visit: Payer: Medicaid Other | Admitting: Neurology

## 2023-11-05 ENCOUNTER — Other Ambulatory Visit (HOSPITAL_COMMUNITY): Payer: Self-pay

## 2023-11-05 ENCOUNTER — Telehealth: Payer: Self-pay | Admitting: Pharmacy Technician

## 2023-11-05 NOTE — Telephone Encounter (Signed)
Pharmacy Patient Advocate Encounter  Received notification from [facility] that Prior Authorization for Aimovig 70MG /ML auto-injectors has been APPROVED from 11/05/2023 to 11/04/2024   PA #/Case ID/Reference #: [id number] Key: [id number]

## 2023-12-16 ENCOUNTER — Ambulatory Visit: Payer: Medicaid Other | Admitting: Obstetrics & Gynecology

## 2023-12-23 ENCOUNTER — Ambulatory Visit: Payer: Medicaid Other | Admitting: Obstetrics & Gynecology

## 2023-12-23 ENCOUNTER — Encounter: Payer: Self-pay | Admitting: Obstetrics & Gynecology

## 2023-12-23 VITALS — BP 112/73 | HR 81 | Wt 156.0 lb

## 2023-12-23 DIAGNOSIS — Z3042 Encounter for surveillance of injectable contraceptive: Secondary | ICD-10-CM | POA: Diagnosis not present

## 2023-12-23 DIAGNOSIS — Z3009 Encounter for other general counseling and advice on contraception: Secondary | ICD-10-CM | POA: Diagnosis not present

## 2023-12-23 LAB — POCT URINE PREGNANCY: Preg Test, Ur: NEGATIVE

## 2023-12-23 MED ORDER — MEDROXYPROGESTERONE ACETATE 150 MG/ML IM SUSP
150.0000 mg | Freq: Once | INTRAMUSCULAR | Status: AC
Start: 2023-12-23 — End: 2023-12-23
  Administered 2023-12-23: 150 mg via INTRAMUSCULAR

## 2023-12-23 NOTE — Progress Notes (Signed)
History:  33 y.o. [id number] here today for contraception counseling. Pt reports that about 2 nights after intercourse, she started to have pain and late that evening ~0300 she went to the restroom and when she wiped the IUD was on the tissue. The pain stopped immediately. She is not interested in replacing the IUD but, wants to restart Depo P{provera. She reports that she was in Dep Provera prev with no side effects.    The following portions of the patient's history were reviewed and updated as appropriate: allergies, current medications, past family history, past medical history, past social history, past surgical history and problem list.  Review of Systems:  Pertinent items are noted in HPI.    Objective:  Physical Exam Blood pressure 112/73, pulse 81, weight 156 lb (70.8 kg).  CONSTITUTIONAL: Well-developed, well-nourished female in no acute distress.  HENT:  Normocephalic, atraumatic EYES: Conjunctivae and EOM are normal. No scleral icterus.  NECK: Normal range of motion SKIN: Skin is warm and dry. No rash noted. Not diaphoretic.No pallor. NEUROLGIC: Alert and oriented to person, place, and time. Normal coordination.  Pelvic: not indicated  Labs and Imaging UPT neg  Assessment & Plan:  Reviewed all forms of birth control options available including abstinence; over the counter/barrier methods; hormonal contraceptive medication including pill, patch, ring, injection,contraceptive implant; hormonal and nonhormonal IUDs; permanent sterilization options including vasectomy and the various tubal sterilization modalities. Risks and benefits reviewed.  Questions were answered.  Information was given to patient to review.    Diagnoses and all orders for this visit:  Encounter for counseling regarding contraception -     POCT urine pregnancy -     medroxyPROGESTERone (DEPO-PROVERA) injection 150 mg   F/u every 12 weeks for Depo Provera injection   Shaman Muscarella L. Harraway-Smith, M.D.,  [place]

## 2024-01-06 NOTE — Progress Notes (Deleted)
 New patient visit   Patient: Carla [name]   DOB: 08-26-1991   33 y.o. Female  MRN: [id number] Visit Date: 01/07/2024  Today's healthcare provider: Alfredia Ferguson, PA-C   No chief complaint on file.  Subjective    Carla [name] is a 33 y.o. female who presents today as a new patient to establish care.   ***  Past Medical History:  Diagnosis Date   ADD (attention deficit disorder)    Anemia    Asthma    Bronchitis    Eczema    Heart palpitations    Hyperthyroidism    Insomnia    Migraine    Recurrent upper respiratory infection (URI)    Urticaria    Past Surgical History:  Procedure Laterality Date   ADENOIDECTOMY     NASAL SEPTOPLASTY W/ TURBINOPLASTY  2017   SINOSCOPY     TONSILLECTOMY     VAGINA RECONSTRUCTION SURGERY N/A    Family Status  Relation Name Status   Mother  Alive   Father  Alive   Brother  (Not Specified)   MGM  (Not Specified)   PGF  (Not Specified)   Neg Hx  (Not Specified)  No partnership data on file   Family History  Problem Relation Age of Onset   Hypertension Mother    Migraines Mother    Hypertension Father    Diabetes Father    Migraines Father    Diabetes Brother    Migraines Brother    Diabetes Maternal Grandmother    Stroke Maternal Grandmother    Migraines Maternal Grandmother    Diabetes Paternal Grandfather    Heart disease Neg Hx    Social History   Socioeconomic History   Marital status: Single    Spouse name: Not on file   Number of children: 2   Years of education: Not on file   Highest education level: Some college, no degree  Occupational History   Not on file  Tobacco Use   Smoking status: Former    Current packs/day: 0.00    Average packs/day: 0.3 packs/day for 4.0 years (1.0 ttl pk-yrs)    Types: Cigarettes    Start date: 08/18/2014    Quit date: 08/18/2018    Years since quitting: 5.3   Smokeless tobacco: Never   Tobacco comments:    patient quit 2 weeks ago. 09-01-18  Vaping  Use   Vaping status: Never Used  Substance and Sexual Activity   Alcohol use: Never   Drug use: Never   Sexual activity: Yes    Birth control/protection: I.U.D.  Other Topics Concern   Not on file  Social History Narrative   Not on file   Social Drivers of Health   Financial Resource Strain: Not on file  Food Insecurity: No Food Insecurity (06/19/2022)   Hunger Vital Sign    Worried About Running Out of Food in the Last Year: Never true    Ran Out of Food in the Last Year: Never true  Transportation Needs: No Transportation Needs (06/19/2022)   PRAPARE - Administrator, Civil Service (Medical): No    Lack of Transportation (Non-Medical): No  Physical Activity: Not on file  Stress: Not on file  Social Connections: Not on file   Outpatient Medications Prior to Visit  Medication Sig   amphetamine-dextroamphetamine (ADDERALL XR) 20 MG 24 hr capsule Take 20 mg by mouth every morning. (Patient not taking: Reported on 12/23/2023)   Butalbital-APAP-Caffeine 50-325-40  MG capsule Take 1-2 capsules by mouth every 6 (six) hours as needed for headache. (Patient not taking: Reported on 12/23/2023)   citalopram (CELEXA) 20 MG tablet Take 20 mg by mouth at bedtime. (Patient not taking: Reported on 12/23/2023)   cyclobenzaprine (FLEXERIL) 10 MG tablet Take 1 tablet (10 mg total) by mouth 3 (three) times daily as needed (headache). (Patient not taking: Reported on 12/23/2023)   diclofenac (CATAFLAM) 50 MG tablet Take 1 tablet (50 mg total) by mouth 3 (three) times daily. For severe pain may take 2 tablets. DO NOT exceed 4 tabs per day. (Patient not taking: Reported on 12/23/2023)   Erenumab-aooe (AIMOVIG) 70 MG/ML SOAJ Inject 70 mg into the skin every 30 (thirty) days. (Patient not taking: Reported on 12/23/2023)   escitalopram (LEXAPRO) 20 MG tablet Take 20 mg by mouth daily. (Patient not taking: Reported on 12/23/2023)   ibuprofen (ADVIL) 800 MG tablet Take 800 mg by mouth every 8 (eight) hours  as needed. (Patient not taking: Reported on 12/23/2023)   lamoTRIgine (LAMICTAL) 25 MG tablet One tab every night one week, then 2 tabs every night   ondansetron (ZOFRAN) 4 MG tablet Take 1 tablet (4 mg total) by mouth every 8 (eight) hours as needed for nausea or vomiting. (Patient not taking: Reported on 12/23/2023)   propranolol (INDERAL) 40 MG tablet Take 1 tablet (40 mg total) by mouth 2 (two) times daily. (Patient not taking: Reported on 12/23/2023)   rizatriptan (MAXALT-MLT) 10 MG disintegrating tablet Take 1 tablet (10 mg total) by mouth as needed. May repeat in 2 hours if needed (Patient not taking: Reported on 12/23/2023)   tiZANidine (ZANAFLEX) 4 MG tablet Take 1 tablet (4 mg total) by mouth every 6 (six) hours as needed for muscle spasms. (Patient not taking: Reported on 12/23/2023)   traMADol (ULTRAM) 50 MG tablet Take 1 tablet (50 mg total) by mouth every 6 (six) hours as needed for severe pain. (Patient not taking: Reported on 12/23/2023)   No facility-administered medications prior to visit.   Allergies  Allergen Reactions   Latex Hives    Immunization History  Administered Date(s) Administered   Tdap 05/16/2021    Health Maintenance  Topic Date Due   COVID-19 Vaccine (1) Never done   Pneumococcal Vaccine 60-42 Years old (1 of 2 - PCV) Never done   INFLUENZA VACCINE  Never done   Cervical Cancer Screening (HPV/Pap Cotest)  03/10/2028   DTaP/Tdap/Td (2 - Td or Tdap) 05/17/2031   Hepatitis C Screening  Completed   HIV Screening  Completed   HPV VACCINES  Aged Out    Patient Care Team: Pcp, No as PCP - General Tobb, Kardie, DO as PCP - Cardiology (Cardiology)  Review of Systems  {Insert previous labs (optional):23779} {See past labs  Heme  Chem  Endocrine  Serology  Results Review (optional):1}   Objective    There were no vitals taken for this visit. {Insert last BP/Wt (optional):23777}{See vitals history (optional):1}   Physical Exam ***  Depression  Screen    03/11/2023    1:29 PM 04/18/2021    8:30 AM 03/25/2021    3:06 PM  PHQ 2/9 Scores  PHQ - 2 Score 0 1 1  PHQ- 9 Score 10 7 10    No results found for any visits on 01/07/24.  Assessment & Plan     There are no diagnoses linked to this encounter.   No follow-ups on file.      Alfredia Ferguson, PA-C  [facility] [phone] (phone) [phone] (fax)  [facility]

## 2024-01-07 ENCOUNTER — Ambulatory Visit: Payer: Medicaid Other | Admitting: Physician Assistant

## 2024-01-14 NOTE — Progress Notes (Unsigned)
 New patient visit   Patient: Carla [name]   DOB: Mar 29, 1991   33 y.o. Female  MRN: [id number] Visit Date: 01/15/2024  Today's healthcare provider: Alfredia Ferguson, PA-C  Cc. New patient ,establish care  Subjective    Carla [name] is a 33 y.o. female who presents today as a new patient to establish care.   Discussed the use of AI scribe software for clinical note transcription with the patient, who gave verbal consent to proceed.  History of Present Illness   The patient, with a past medical history of migraines, thyroid issues, and asthma, presents to establish primary care. The patient reports experiencing breathing difficulties, which she attributes to burning wood in her home due to inadequate heating. She mentions that the smoke from the wood is affecting her breathing and causing discomfort in her chest.   She also reports frequent ear infections, reports nasal congestion and ear pain today.  She used to get frequent headaches, follows with neurology, the headaches have improved from daily occurrences to once or twice a week. The patient notes that her headaches are less severe and more manageable now. She also mentions that she has been working on adjusting her diet to help manage her headaches.       Past Medical History:  Diagnosis Date   ADD (attention deficit disorder)    Anemia    Asthma    Bronchitis    Eczema    Heart palpitations    Hyperthyroidism    Insomnia    Migraine    Recurrent upper respiratory infection (URI)    Urticaria    Past Surgical History:  Procedure Laterality Date   ADENOIDECTOMY     NASAL SEPTOPLASTY W/ TURBINOPLASTY  2017   SINOSCOPY     TONSILLECTOMY     VAGINA RECONSTRUCTION SURGERY N/A    Family Status  Relation Name Status   Mother  Alive   Father  Alive   Brother  (Not Specified)   MGM  (Not Specified)   PGF  (Not Specified)   Neg Hx  (Not Specified)  No partnership data on file   Family History   Problem Relation Age of Onset   Hypertension Mother    Migraines Mother    Hypertension Father    Diabetes Father    Migraines Father    Diabetes Brother    Migraines Brother    Diabetes Maternal Grandmother    Stroke Maternal Grandmother    Migraines Maternal Grandmother    Diabetes Paternal Grandfather    Heart disease Neg Hx    Social History   Socioeconomic History   Marital status: Single    Spouse name: Not on file   Number of children: 2   Years of education: Not on file   Highest education level: Some college, no degree  Occupational History   Not on file  Tobacco Use   Smoking status: Former    Current packs/day: 0.00    Average packs/day: 0.3 packs/day for 4.0 years (1.0 ttl pk-yrs)    Types: Cigarettes    Start date: 08/18/2014    Quit date: 08/18/2018    Years since quitting: 5.4   Smokeless tobacco: Never   Tobacco comments:    patient quit 2 weeks ago. 09-01-18  Vaping Use   Vaping status: Never Used  Substance and Sexual Activity   Alcohol use: Never   Drug use: Never   Sexual activity: Yes    Birth control/protection: I.U.D.  Other Topics Concern   Not on file  Social History Narrative   Not on file   Social Drivers of Health   Financial Resource Strain: Not on file  Food Insecurity: No Food Insecurity (06/19/2022)   Hunger Vital Sign    Worried About Running Out of Food in the Last Year: Never true    Ran Out of Food in the Last Year: Never true  Transportation Needs: No Transportation Needs (06/19/2022)   PRAPARE - Administrator, Civil Service (Medical): No    Lack of Transportation (Non-Medical): No  Physical Activity: Not on file  Stress: Not on file  Social Connections: Not on file   Outpatient Medications Prior to Visit  Medication Sig   Butalbital-APAP-Caffeine 50-325-40 MG capsule Take 1-2 capsules by mouth every 6 (six) hours as needed for headache.   cyclobenzaprine (FLEXERIL) 10 MG tablet Take 10 mg by mouth 3  (three) times daily as needed for muscle spasms.   [DISCONTINUED] amphetamine-dextroamphetamine (ADDERALL XR) 20 MG 24 hr capsule Take 20 mg by mouth every morning. (Patient not taking: Reported on 12/23/2023)   [DISCONTINUED] citalopram (CELEXA) 20 MG tablet Take 20 mg by mouth at bedtime. (Patient not taking: Reported on 12/23/2023)   [DISCONTINUED] cyclobenzaprine (FLEXERIL) 10 MG tablet Take 1 tablet (10 mg total) by mouth 3 (three) times daily as needed (headache). (Patient not taking: Reported on 12/23/2023)   [DISCONTINUED] diclofenac (CATAFLAM) 50 MG tablet Take 1 tablet (50 mg total) by mouth 3 (three) times daily. For severe pain may take 2 tablets. DO NOT exceed 4 tabs per day. (Patient not taking: Reported on 12/23/2023)   [DISCONTINUED] Erenumab-aooe (AIMOVIG) 70 MG/ML SOAJ Inject 70 mg into the skin every 30 (thirty) days. (Patient not taking: Reported on 12/23/2023)   [DISCONTINUED] escitalopram (LEXAPRO) 20 MG tablet Take 20 mg by mouth daily. (Patient not taking: Reported on 12/23/2023)   [DISCONTINUED] ibuprofen (ADVIL) 800 MG tablet Take 800 mg by mouth every 8 (eight) hours as needed. (Patient not taking: Reported on 12/23/2023)   [DISCONTINUED] lamoTRIgine (LAMICTAL) 25 MG tablet One tab every night one week, then 2 tabs every night   [DISCONTINUED] ondansetron (ZOFRAN) 4 MG tablet Take 1 tablet (4 mg total) by mouth every 8 (eight) hours as needed for nausea or vomiting. (Patient not taking: Reported on 09/07/2023)   [DISCONTINUED] propranolol (INDERAL) 40 MG tablet Take 1 tablet (40 mg total) by mouth 2 (two) times daily. (Patient not taking: Reported on 12/23/2023)   [DISCONTINUED] rizatriptan (MAXALT-MLT) 10 MG disintegrating tablet Take 1 tablet (10 mg total) by mouth as needed. May repeat in 2 hours if needed (Patient not taking: Reported on 12/23/2023)   [DISCONTINUED] tiZANidine (ZANAFLEX) 4 MG tablet Take 1 tablet (4 mg total) by mouth every 6 (six) hours as needed for muscle spasms.  (Patient not taking: Reported on 12/23/2023)   [DISCONTINUED] traMADol (ULTRAM) 50 MG tablet Take 1 tablet (50 mg total) by mouth every 6 (six) hours as needed for severe pain. (Patient not taking: Reported on 12/23/2023)   No facility-administered medications prior to visit.   Allergies  Allergen Reactions   Latex Hives    Immunization History  Administered Date(s) Administered   Moderna Sars-Covid-2 Vaccination 08/16/2020, 10/19/2020   Tdap 05/16/2021    Health Maintenance  Topic Date Due   Pneumococcal Vaccine 39-62 Years old (1 of 2 - PCV) Never done   COVID-19 Vaccine (3 - Moderna risk series) 11/16/2020   INFLUENZA VACCINE  Never done  Cervical Cancer Screening (HPV/Pap Cotest)  03/10/2028   DTaP/Tdap/Td (2 - Td or Tdap) 05/17/2031   Hepatitis C Screening  Completed   HIV Screening  Completed   HPV VACCINES  Aged Out    Patient Care Team: Alfredia Ferguson, PA-C as PCP - General (Physician Assistant) Thomasene Ripple, DO as PCP - Cardiology (Cardiology)  Review of Systems  Constitutional:  Negative for fatigue and fever.  Respiratory:  Positive for shortness of breath and wheezing. Negative for cough.   Cardiovascular:  Negative for chest pain and leg swelling.  Gastrointestinal:  Negative for abdominal pain.  Neurological:  Positive for headaches. Negative for dizziness.        Objective    BP 125/81   Pulse 83   Temp 98.2 F (36.8 C) (Oral)   Ht 5\' 9"  (1.753 m)   Wt 158 lb 4 oz (71.8 kg)   SpO2 99%   BMI 23.37 kg/m     Physical Exam Constitutional:      General: She is awake.     Appearance: She is well-developed.  HENT:     Head: Normocephalic.  Eyes:     Conjunctiva/sclera: Conjunctivae normal.  Cardiovascular:     Rate and Rhythm: Normal rate and regular rhythm.     Heart sounds: Normal heart sounds.  Pulmonary:     Effort: Pulmonary effort is normal.     Breath sounds: Wheezing present.  Skin:    General: Skin is warm.  Neurological:      Mental Status: She is alert and oriented to person, place, and time.  Psychiatric:        Attention and Perception: Attention normal.        Mood and Affect: Mood normal.        Speech: Speech normal.        Behavior: Behavior is cooperative.    Depression Screen    01/15/2024   10:44 AM 03/11/2023    1:29 PM 04/18/2021    8:30 AM 03/25/2021    3:06 PM  PHQ 2/9 Scores  PHQ - 2 Score 0 0 1 1  PHQ- 9 Score  10 7 10    No results found for any visits on 01/15/24.  Assessment & Plan     Moderate persistent asthma with acute exacerbation Assessment & Plan: Currently uncontrolled. Rx albuterol inhaler-- advised pt to monitor frequency of use. May need daily therapy during seasonal flares.  In exacerbation today w/ acute wheezing, Rx prednisone 20 mg x 5 days  Orders: -     Albuterol Sulfate HFA; Inhale 2 puffs into the lungs every 6 (six) hours as needed for wheezing or shortness of breath.  Dispense: 8 g; Refill: 2 -     predniSONE; Take 1 tablet (20 mg total) by mouth daily with breakfast.  Dispense: 5 tablet; Refill: 0  Prediabetes Assessment & Plan: Historically, repeat a1c  Orders: -     CBC with Differential/Platelet -     Comprehensive metabolic panel -     Hemoglobin A1c  Nasal congestion Assessment & Plan: 2/2 asthma flare vs URI  Rx claritin, flonase daily  Orders: -     Loratadine; Take 1 tablet (10 mg total) by mouth daily.  Dispense: 90 tablet; Refill: 1 -     Fluticasone Propionate; Place 2 sprays into both nostrils daily.  Dispense: 16 g; Refill: 1  Abnormal thyroid blood test Assessment & Plan: Historically repeat tsh/t4 today  Orders: -     CBC  with Differential/Platelet -     Comprehensive metabolic panel -     TSH -     T4, free  Lipid screening -     Lipid panel  Chronic migraine w/o aura w/o status migrainosus, not intractable Assessment & Plan: Pt follows with neuro, she will continue care there. Currently manages with fioricet prn and prn  cyclobenzaprine. Reports infrequent use of triptan and no regular daily preventative medication    General Health Maintenance Patient does not regularly receive flu shots due to a past adverse reactionDiscussed importance of regular health maintenance and preventive measures.    Return in about 6 weeks (around 02/26/2024) for asthma.     Alfredia Ferguson, PA-C  [facility] [phone] (phone) [phone] (fax)  [facility]

## 2024-01-15 ENCOUNTER — Encounter: Payer: Self-pay | Admitting: Physician Assistant

## 2024-01-15 ENCOUNTER — Ambulatory Visit: Payer: Medicaid Other | Admitting: Physician Assistant

## 2024-01-15 VITALS — BP 125/81 | HR 83 | Temp 98.2°F | Ht 69.0 in | Wt 158.2 lb

## 2024-01-15 DIAGNOSIS — R7303 Prediabetes: Secondary | ICD-10-CM

## 2024-01-15 DIAGNOSIS — J4541 Moderate persistent asthma with (acute) exacerbation: Secondary | ICD-10-CM | POA: Diagnosis not present

## 2024-01-15 DIAGNOSIS — J4540 Moderate persistent asthma, uncomplicated: Secondary | ICD-10-CM | POA: Diagnosis not present

## 2024-01-15 DIAGNOSIS — R7989 Other specified abnormal findings of blood chemistry: Secondary | ICD-10-CM | POA: Insufficient documentation

## 2024-01-15 DIAGNOSIS — Z13220 Encounter for screening for lipoid disorders: Secondary | ICD-10-CM

## 2024-01-15 DIAGNOSIS — R0981 Nasal congestion: Secondary | ICD-10-CM

## 2024-01-15 DIAGNOSIS — G43709 Chronic migraine without aura, not intractable, without status migrainosus: Secondary | ICD-10-CM

## 2024-01-15 LAB — COMPREHENSIVE METABOLIC PANEL
ALT: 11 U/L (ref 0–35)
AST: 13 U/L (ref 0–37)
Albumin: 4.2 g/dL (ref 3.5–5.2)
Alkaline Phosphatase: 53 U/L (ref 39–117)
BUN: 9 mg/dL (ref 6–23)
CO2: 31 meq/L (ref 19–32)
Calcium: 9.1 mg/dL (ref 8.4–10.5)
Chloride: 105 meq/L (ref 96–112)
Creatinine, Ser: 0.77 mg/dL (ref 0.40–1.20)
GFR: 100.92 mL/min (ref >60.00)
Glucose, Bld: 80 mg/dL (ref 70–99)
Potassium: 4.5 meq/L (ref 3.5–5.1)
Sodium: 141 meq/L (ref 135–145)
Total Bilirubin: 0.3 mg/dL (ref 0.2–1.2)
Total Protein: 6.9 g/dL (ref 6.0–8.3)

## 2024-01-15 LAB — CBC WITH DIFFERENTIAL/PLATELET
Basophils Absolute: 0 10*3/uL (ref 0.0–0.1)
Basophils Relative: 0.7 % (ref 0.0–3.0)
Eosinophils Absolute: 0.1 10*3/uL (ref 0.0–0.7)
Eosinophils Relative: 3 % (ref 0.0–5.0)
HCT: 40.2 % (ref 36.0–46.0)
Hemoglobin: 13 g/dL (ref 12.0–15.0)
Lymphocytes Relative: 32.4 % (ref 12.0–46.0)
Lymphs Abs: 1.5 10*3/uL (ref 0.7–4.0)
MCHC: 32.3 g/dL (ref 30.0–36.0)
MCV: 90.2 fL (ref 78.0–100.0)
Monocytes Absolute: 0.6 10*3/uL (ref 0.1–1.0)
Monocytes Relative: 12.2 % — ABNORMAL HIGH (ref 3.0–12.0)
Neutro Abs: 2.5 10*3/uL (ref 1.4–7.7)
Neutrophils Relative %: 51.7 % (ref 43.0–77.0)
Platelets: 235 10*3/uL (ref 150.0–400.0)
RBC: 4.45 Mil/uL (ref 3.87–5.11)
RDW: 13.3 % (ref 11.5–15.5)
WBC: 4.7 10*3/uL (ref 4.0–10.5)

## 2024-01-15 LAB — LIPID PANEL
Cholesterol: 173 mg/dL (ref 0–200)
HDL: 50.7 mg/dL (ref >39.00)
LDL Cholesterol: 106 mg/dL — ABNORMAL HIGH (ref 0–99)
NonHDL: 122.34
Total CHOL/HDL Ratio: 3
Triglycerides: 84 mg/dL (ref 0.0–149.0)
VLDL: 16.8 mg/dL (ref 0.0–40.0)

## 2024-01-15 LAB — HEMOGLOBIN A1C: Hgb A1c MFr Bld: 6 % (ref 4.6–6.5)

## 2024-01-15 LAB — TSH: TSH: 0.82 u[IU]/mL (ref 0.35–5.50)

## 2024-01-15 LAB — T4, FREE: Free T4: 0.76 ng/dL (ref 0.60–1.60)

## 2024-01-15 MED ORDER — LORATADINE 10 MG PO TABS: 10.0000 mg | ORAL_TABLET | Freq: Every day | ORAL | 1 refills | Status: DC

## 2024-01-15 MED ORDER — PREDNISONE 20 MG PO TABS
20.0000 mg | ORAL_TABLET | Freq: Every day | ORAL | 0 refills | Status: DC
Start: 2024-01-15 — End: 2024-08-08

## 2024-01-15 MED ORDER — ALBUTEROL SULFATE HFA 108 (90 BASE) MCG/ACT IN AERS: 2.0000 | INHALATION_SPRAY | Freq: Four times a day (QID) | RESPIRATORY_TRACT | 2 refills | Status: AC | PRN

## 2024-01-15 MED ORDER — FLUTICASONE PROPIONATE 50 MCG/ACT NA SUSP: 2.0000 | Freq: Every day | NASAL | 1 refills | Status: DC

## 2024-01-15 NOTE — Assessment & Plan Note (Signed)
 Pt follows with neuro, she will continue care there. Currently manages with fioricet prn and prn cyclobenzaprine. Reports infrequent use of triptan and no regular daily preventative medication

## 2024-01-15 NOTE — Assessment & Plan Note (Signed)
 Historically repeat tsh/t4 today

## 2024-01-15 NOTE — Assessment & Plan Note (Signed)
 2/2 asthma flare vs URI  Rx claritin, flonase daily

## 2024-01-15 NOTE — Assessment & Plan Note (Signed)
 Currently uncontrolled. Rx albuterol inhaler-- advised pt to monitor frequency of use. May need daily therapy during seasonal flares.  In exacerbation today w/ acute wheezing, Rx prednisone 20 mg x 5 days

## 2024-01-15 NOTE — Assessment & Plan Note (Signed)
Historically, repeat a1c

## 2024-01-19 ENCOUNTER — Encounter: Payer: Self-pay | Admitting: Physician Assistant

## 2024-02-07 ENCOUNTER — Other Ambulatory Visit: Payer: Self-pay | Admitting: Physician Assistant

## 2024-02-07 DIAGNOSIS — R0981 Nasal congestion: Secondary | ICD-10-CM

## 2024-03-11 ENCOUNTER — Ambulatory Visit: Payer: Medicaid Other

## 2024-03-14 ENCOUNTER — Ambulatory Visit

## 2024-03-14 ENCOUNTER — Other Ambulatory Visit (HOSPITAL_BASED_OUTPATIENT_CLINIC_OR_DEPARTMENT_OTHER): Payer: Self-pay

## 2024-03-14 VITALS — BP 136/80 | HR 82 | Ht 69.75 in | Wt 153.0 lb

## 2024-03-14 DIAGNOSIS — Z3202 Encounter for pregnancy test, result negative: Secondary | ICD-10-CM

## 2024-03-14 DIAGNOSIS — Z3042 Encounter for surveillance of injectable contraceptive: Secondary | ICD-10-CM

## 2024-03-14 DIAGNOSIS — Z30013 Encounter for initial prescription of injectable contraceptive: Secondary | ICD-10-CM

## 2024-03-14 LAB — POCT URINE PREGNANCY: Preg Test, Ur: NEGATIVE

## 2024-03-14 MED ORDER — MEDROXYPROGESTERONE ACETATE 150 MG/ML IM SUSP
150.0000 mg | INTRAMUSCULAR | 0 refills | Status: DC
  Filled 2024-03-14: qty 1, 90d supply, fill #0

## 2024-03-14 MED ORDER — MEDROXYPROGESTERONE ACETATE 150 MG/ML IM SUSY
150.0000 mg | PREFILLED_SYRINGE | Freq: Once | INTRAMUSCULAR | Status: AC
  Administered 2024-03-14: 150 mg via INTRAMUSCULAR

## 2024-03-14 MED ORDER — MEDROXYPROGESTERONE ACETATE 150 MG/ML IM SUSP: 150.0000 mg | INTRAMUSCULAR | 0 refills | Status: DC

## 2024-03-14 NOTE — Progress Notes (Signed)
 Date last pap: 03/11/2023. Last Depo-Provera : 12/23/2023. Side Effects if any: None. Serum HCG indicated? No. Depo-Provera  150 mg IM given by: [facility] X[facility] RN. Next appointment due 05/30/24-06/13/24.

## 2024-04-01 ENCOUNTER — Telehealth: Payer: Self-pay

## 2024-04-01 NOTE — Telephone Encounter (Signed)
 Patient called stating she received her 2nd Depo injection on 03/14/24 and she has been bleeding every day since the injection. She states she is feeling weak and tired.Patient states had been bleeding for 3 weeks before the 2nd injection. A message will be sent to the provider.  Rovena Hearld l Levita Monical, CMA

## 2024-04-13 ENCOUNTER — Other Ambulatory Visit: Payer: Self-pay | Admitting: Obstetrics & Gynecology

## 2024-04-13 ENCOUNTER — Encounter: Payer: Self-pay | Admitting: Obstetrics & Gynecology

## 2024-04-13 DIAGNOSIS — N921 Excessive and frequent menstruation with irregular cycle: Secondary | ICD-10-CM

## 2024-04-13 MED ORDER — ESTRADIOL 1 MG PO TABS: 1.0000 mg | ORAL_TABLET | Freq: Every day | ORAL | 0 refills | Status: DC

## 2024-04-14 ENCOUNTER — Telehealth: Payer: Self-pay | Admitting: Neurology

## 2024-04-14 NOTE — Telephone Encounter (Signed)
 Pt called stating that the last time she came in to the office the provider was going to start her on the Aimovig  but when she went to request it she was informed that it is needing a PA. Please advise.

## 2024-04-15 ENCOUNTER — Other Ambulatory Visit (HOSPITAL_COMMUNITY): Payer: Self-pay

## 2024-04-15 ENCOUNTER — Telehealth: Payer: Self-pay

## 2024-04-15 NOTE — Telephone Encounter (Signed)
 Pharmacy Patient Advocate Encounter   Received notification from Physician's Office that prior authorization for Aimovig  is required/requested.   Insurance verification completed.   The patient is insured through [facility] .   Per test claim: The current 28 day co-pay is, $4.00.  No PA needed at this time. This test claim was processed through [facility]- copay amounts may vary at other pharmacies due to pharmacy/plan contracts, or as the patient moves through the different stages of their insurance plan.    Please see previous PA approval telephone note dated: 11/05/2023

## 2024-06-03 ENCOUNTER — Ambulatory Visit

## 2024-06-08 ENCOUNTER — Ambulatory Visit

## 2024-06-08 ENCOUNTER — Other Ambulatory Visit: Payer: Self-pay

## 2024-06-08 ENCOUNTER — Other Ambulatory Visit (HOSPITAL_BASED_OUTPATIENT_CLINIC_OR_DEPARTMENT_OTHER): Payer: Self-pay

## 2024-06-08 DIAGNOSIS — Z30013 Encounter for initial prescription of injectable contraceptive: Secondary | ICD-10-CM

## 2024-06-08 MED ORDER — MEDROXYPROGESTERONE ACETATE 150 MG/ML IM SUSY
150.0000 mg | PREFILLED_SYRINGE | INTRAMUSCULAR | 0 refills | Status: DC
  Filled 2024-06-08: qty 1, 90d supply, fill #0

## 2024-06-08 MED ORDER — MEDROXYPROGESTERONE ACETATE 150 MG/ML IM SUSP
150.0000 mg | Freq: Once | INTRAMUSCULAR | Status: AC
Start: 2024-06-08 — End: 2024-06-08
  Administered 2024-06-08: 150 mg via INTRAMUSCULAR

## 2024-08-04 ENCOUNTER — Ambulatory Visit: Payer: Self-pay

## 2024-08-04 NOTE — Telephone Encounter (Signed)
 Appt scheduled

## 2024-08-04 NOTE — Telephone Encounter (Signed)
 FYI Only or Action Required?: FYI only for provider.  Patient was last seen in primary care on 01/15/2024 by Cyndi Shaver, PA-C.  Called Nurse Triage reporting Neck Pain.  Symptoms began chronic.  Interventions attempted: Nothing.  Symptoms are: gradually worsening.  Triage Disposition: See PCP Within 2 Weeks  Patient/caregiver understands and will follow disposition?: Yes  Copied from CRM [id number]. Topic: Clinical - Red Word Triage >> Aug 04, 2024  4:05 PM Jasmin G wrote: Red Word that prompted transfer to Nurse Triage: Pain in thyroid  area, also soreness while talking. Reason for Disposition  Neck pain is a chronic symptom (recurrent or ongoing AND present > 4 weeks)  Answer Assessment - Initial Assessment Questions Additional info: Patient called requesting SDV to evaluation chronic neck pain, none are available, scheduled next available visit at practice on Monday 08/08/24, provided patient with [facility] location and hours for today but she is unsure if she will attend and prefers to schedule in office.     1. ONSET: When did the pain begin?      6 months ago, started hurting while talking few days ago, painful with palpation.  2. LOCATION: Where does it hurt?      Center near thyroid  3. PATTERN Does the pain come and go, or has it been constant since it started?      intermittent 4. SEVERITY: How bad is the pain?  (Scale 0-10; or none or slight stiffness, mild, moderate, severe)     mild 5. RADIATION: Does the pain go anywhere else, shoot into your arms?     Denies  6. CORD SYMPTOMS: Any weakness or numbness of the arms or legs?     denies 7. CAUSE: What do you think is causing the neck pain?     Thyroid  8. NECK OVERUSE: Any recent activities that involved turning or twisting the neck?     no 9. OTHER SYMPTOMS: Do you have any other symptoms? (e.g., headache, fever, chest pain, difficulty breathing, neck swelling)     Pain on  palpation. Denies all other symptoms  Protocols used: Neck Pain or Stiffness-A-AH

## 2024-08-08 ENCOUNTER — Encounter: Payer: Self-pay | Admitting: Family Medicine

## 2024-08-08 ENCOUNTER — Ambulatory Visit: Admitting: Family Medicine

## 2024-08-08 VITALS — BP 115/79 | HR 70 | Ht 69.0 in | Wt 157.0 lb

## 2024-08-08 DIAGNOSIS — R7303 Prediabetes: Secondary | ICD-10-CM | POA: Diagnosis not present

## 2024-08-08 DIAGNOSIS — E0789 Other specified disorders of thyroid: Secondary | ICD-10-CM | POA: Diagnosis not present

## 2024-08-08 DIAGNOSIS — R7989 Other specified abnormal findings of blood chemistry: Secondary | ICD-10-CM

## 2024-08-08 DIAGNOSIS — R5383 Other fatigue: Secondary | ICD-10-CM | POA: Diagnosis not present

## 2024-08-08 LAB — TSH: TSH: 0.74 u[IU]/mL (ref 0.35–5.50)

## 2024-08-08 LAB — COMPREHENSIVE METABOLIC PANEL WITH GFR
ALT: 11 U/L (ref 0–35)
AST: 13 U/L (ref 0–37)
Albumin: 4.5 g/dL (ref 3.5–5.2)
Alkaline Phosphatase: 53 U/L (ref 39–117)
BUN: 6 mg/dL (ref 6–23)
CO2: 28 meq/L (ref 19–32)
Calcium: 9.6 mg/dL (ref 8.4–10.5)
Chloride: 105 meq/L (ref 96–112)
Creatinine, Ser: 0.71 mg/dL (ref 0.40–1.20)
GFR: 110.8 mL/min (ref >60.00)
Glucose, Bld: 91 mg/dL (ref 70–99)
Potassium: 5.3 meq/L — ABNORMAL HIGH (ref 3.5–5.1)
Sodium: 140 meq/L (ref 135–145)
Total Bilirubin: 0.3 mg/dL (ref 0.2–1.2)
Total Protein: 6.9 g/dL (ref 6.0–8.3)

## 2024-08-08 LAB — CBC WITH DIFFERENTIAL/PLATELET
Basophils Absolute: 0 K/uL (ref 0.0–0.1)
Basophils Relative: 1 % (ref 0.0–3.0)
Eosinophils Absolute: 0.1 K/uL (ref 0.0–0.7)
Eosinophils Relative: 2.3 % (ref 0.0–5.0)
HCT: 39.3 % (ref 36.0–46.0)
Hemoglobin: 13 g/dL (ref 12.0–15.0)
Lymphocytes Relative: 42.3 % (ref 12.0–46.0)
Lymphs Abs: 1.4 K/uL (ref 0.7–4.0)
MCHC: 33.2 g/dL (ref 30.0–36.0)
MCV: 88.5 fl (ref 78.0–100.0)
Monocytes Absolute: 0.3 K/uL (ref 0.1–1.0)
Monocytes Relative: 8.9 % (ref 3.0–12.0)
Neutro Abs: 1.5 K/uL (ref 1.4–7.7)
Neutrophils Relative %: 45.5 % (ref 43.0–77.0)
Platelets: 238 K/uL (ref 150.0–400.0)
RBC: 4.44 Mil/uL (ref 3.87–5.11)
RDW: 13.1 % (ref 11.5–15.5)
WBC: 3.4 K/uL — ABNORMAL LOW (ref 4.0–10.5)

## 2024-08-08 LAB — HEMOGLOBIN A1C: Hgb A1c MFr Bld: 6 % (ref 4.6–6.5)

## 2024-08-08 LAB — IBC + FERRITIN
Ferritin: 22.5 ng/mL (ref 10.0–291.0)
Iron: 63 ug/dL (ref 42–145)
Saturation Ratios: 15.3 % — ABNORMAL LOW (ref 20.0–50.0)
TIBC: 411.6 ug/dL (ref 250.0–450.0)
Transferrin: 294 mg/dL (ref 212.0–360.0)

## 2024-08-08 LAB — B12 AND FOLATE PANEL
Folate: 9.2 ng/mL (ref >5.9)
Vitamin B-12: 336 pg/mL (ref 211–911)

## 2024-08-08 LAB — T4, FREE: Free T4: 0.93 ng/dL (ref 0.60–1.60)

## 2024-08-08 NOTE — Progress Notes (Signed)
 Acute Office Visit  Subjective:     Patient ID: Carla [name], female    DOB: Jan 16, 1991, 34 y.o.   MRN: [id number]  Chief Complaint  Patient presents with   Neck Pain    Neck Pain    Patient is in today for anterior neck/thyroid  discomfort.   Discussed the use of AI scribe software for clinical note transcription with the patient, who gave verbal consent to proceed.  History of Present Illness Carla [name] is a 33 year old female who presents with anterior neck soreness.  She has been experiencing intermittent soreness in the anterior neck for the past six months. The soreness is exacerbated by touch, movement such as putting on a shirt, and prolonged talking, which she does frequently for work. The episodes last a couple of days and occur every few weeks, with the most recent episode starting late Thursday night and resolving by Saturday. She notes occasional swelling and redness in the area, with her lymph nodes feeling slightly swollen.  No sore throat, fevers, chills, or other sick symptoms. However, she feels very tired all the time, even after a full night's sleep. She does not snore but tosses and turns during sleep. Her voice sometimes becomes raspy, but she does not notice any other significant changes.  Her past medical history includes abnormal thyroid  labs noted during a pregnancy and approximately four to five years ago, which were not significant enough to require treatment. An ultrasound in 2023 showed nonspecific minor thyroid  heterogeneity and subcentimeter benign cystic nodules on the left.  She experiences stiffness in her neck and sometimes takes Flexeril  for migraines, which also helps with the stiffness. She occasionally uses a soft ice pack to ease the soreness.  She works at [employer] or from home, involving a lot of phone calls.         All review of systems negative except what is listed in the HPI      Objective:    BP 115/79    Pulse 70   Ht 5' 9 (1.753 m)   Wt 157 lb (71.2 kg)   SpO2 100%   BMI 23.18 kg/m    Physical Exam Vitals reviewed.  Constitutional:      Appearance: Normal appearance.  Neck:     Comments: Possible slightly enlarged thyroid  Cardiovascular:     Rate and Rhythm: Normal rate and regular rhythm.  Pulmonary:     Effort: Pulmonary effort is normal.     Breath sounds: Normal breath sounds.  Musculoskeletal:     Cervical back: Normal range of motion and neck supple. No tenderness.  Lymphadenopathy:     Cervical: No cervical adenopathy.  Neurological:     Mental Status: She is alert and oriented to person, place, and time.  Psychiatric:        Mood and Affect: Mood normal.        Behavior: Behavior normal.        Thought Content: Thought content normal.        Judgment: Judgment normal.           Assessment & Plan:   Problem List Items Addressed This Visit       Active Problems   Prediabetes   Relevant Orders   Hemoglobin A1c (Completed)   Abnormal thyroid  blood test   Other Visit Diagnoses       Fatigue, unspecified type    -  Primary   Relevant Orders   TSH (Completed)  T4, free (Completed)   B12 and Folate Panel (Completed)   IBC + Ferritin (Completed)   CBC with Differential/Platelet (Completed)   Comprehensive metabolic panel with GFR (Completed)     Painful thyroid        Relevant Orders   TSH (Completed)   T4, free (Completed)   US  THYROID        Assessment & Plan Anterior neck pain and swelling, fatigue Intermittent neck pain and swelling with possible thyroid  dysfunction or other neck pathology. Previous ultrasound showed benign cystic nodules. - Order blood work for thyroid  function and relevant parameters. - Order neck ultrasound to evaluate thyroid  structure and abnormalities.  Prediabetes - She requests repeat A1c today.     No orders of the defined types were placed in this encounter.   Return if symptoms worsen or fail to  improve.  Waddell KATHEE Mon, NP

## 2024-08-09 ENCOUNTER — Ambulatory Visit: Payer: Self-pay | Admitting: Family Medicine

## 2024-08-09 DIAGNOSIS — E875 Hyperkalemia: Secondary | ICD-10-CM

## 2024-08-15 ENCOUNTER — Other Ambulatory Visit (INDEPENDENT_AMBULATORY_CARE_PROVIDER_SITE_OTHER)

## 2024-08-15 ENCOUNTER — Ambulatory Visit (HOSPITAL_BASED_OUTPATIENT_CLINIC_OR_DEPARTMENT_OTHER)
Admission: RE | Admit: 2024-08-15 | Discharge: 2024-08-15 | Disposition: A | Discharge status: Home / Self Care | Source: Ambulatory Visit | Attending: Family Medicine | Admitting: Family Medicine

## 2024-08-15 DIAGNOSIS — E875 Hyperkalemia: Secondary | ICD-10-CM | POA: Diagnosis not present

## 2024-08-15 DIAGNOSIS — E0789 Other specified disorders of thyroid: Secondary | ICD-10-CM | POA: Insufficient documentation

## 2024-08-15 LAB — POTASSIUM: Potassium: 4.6 meq/L (ref 3.5–5.1)

## 2024-08-16 ENCOUNTER — Ambulatory Visit: Payer: Self-pay | Admitting: Family Medicine

## 2024-08-16 DIAGNOSIS — R5383 Other fatigue: Secondary | ICD-10-CM

## 2024-08-26 ENCOUNTER — Ambulatory Visit

## 2024-08-30 NOTE — Progress Notes (Signed)
Error, not seen

## 2024-09-15 ENCOUNTER — Other Ambulatory Visit: Payer: Self-pay | Admitting: Neurology

## 2024-09-15 NOTE — Telephone Encounter (Signed)
 Last seen on 8/19/4 No follow up scheduled   Rx denied pt needs OV

## 2024-09-19 ENCOUNTER — Other Ambulatory Visit (HOSPITAL_COMMUNITY)
Admission: RE | Admit: 2024-09-19 | Discharge: 2024-09-19 | Disposition: A | Discharge status: Home / Self Care | Source: Ambulatory Visit | Attending: Obstetrics and Gynecology | Admitting: Obstetrics and Gynecology

## 2024-09-19 ENCOUNTER — Ambulatory Visit (HOSPITAL_BASED_OUTPATIENT_CLINIC_OR_DEPARTMENT_OTHER)
Admission: RE | Admit: 2024-09-19 | Discharge: 2024-09-19 | Disposition: A | Discharge status: Home / Self Care | Source: Ambulatory Visit | Attending: Obstetrics and Gynecology | Admitting: Obstetrics and Gynecology

## 2024-09-19 ENCOUNTER — Other Ambulatory Visit: Payer: Self-pay

## 2024-09-19 ENCOUNTER — Ambulatory Visit: Admitting: Obstetrics and Gynecology

## 2024-09-19 VITALS — BP 116/79 | HR 83 | Ht 69.34 in | Wt 155.1 lb

## 2024-09-19 DIAGNOSIS — Z01419 Encounter for gynecological examination (general) (routine) without abnormal findings: Secondary | ICD-10-CM

## 2024-09-19 DIAGNOSIS — Z3202 Encounter for pregnancy test, result negative: Secondary | ICD-10-CM | POA: Diagnosis not present

## 2024-09-19 DIAGNOSIS — F419 Anxiety disorder, unspecified: Secondary | ICD-10-CM

## 2024-09-19 DIAGNOSIS — Z113 Encounter for screening for infections with a predominantly sexual mode of transmission: Secondary | ICD-10-CM | POA: Diagnosis not present

## 2024-09-19 DIAGNOSIS — R102 Pelvic and perineal pain unspecified side: Secondary | ICD-10-CM | POA: Insufficient documentation

## 2024-09-19 DIAGNOSIS — Z124 Encounter for screening for malignant neoplasm of cervix: Secondary | ICD-10-CM

## 2024-09-19 DIAGNOSIS — Z202 Contact with and (suspected) exposure to infections with a predominantly sexual mode of transmission: Secondary | ICD-10-CM | POA: Diagnosis present

## 2024-09-19 DIAGNOSIS — Z1331 Encounter for screening for depression: Secondary | ICD-10-CM | POA: Diagnosis not present

## 2024-09-19 LAB — POCT URINALYSIS DIPSTICK
Bilirubin, UA: NEGATIVE
Blood, UA: NEGATIVE
Glucose, UA: NEGATIVE
Ketones, UA: NEGATIVE
Leukocytes, UA: NEGATIVE
Nitrite, UA: NEGATIVE
Protein, UA: NEGATIVE
Spec Grav, UA: 1.01 (ref 1.010–1.025)
Urobilinogen, UA: NEGATIVE U/dL — AB
pH, UA: 7 (ref 5.0–8.0)

## 2024-09-19 LAB — POCT URINE PREGNANCY: Preg Test, Ur: NEGATIVE

## 2024-09-19 MED ORDER — METRONIDAZOLE 500 MG PO TABS: 500.0000 mg | ORAL_TABLET | Freq: Two times a day (BID) | ORAL | 0 refills | Status: AC

## 2024-09-19 MED ORDER — CEFTRIAXONE SODIUM 500 MG IJ SOLR
500.0000 mg | Freq: Once | INTRAMUSCULAR | Status: AC
  Administered 2024-09-19: 500 mg via INTRAMUSCULAR

## 2024-09-19 MED ORDER — DOXYCYCLINE HYCLATE 100 MG PO CAPS: 100.0000 mg | ORAL_CAPSULE | Freq: Two times a day (BID) | ORAL | 0 refills | Status: AC

## 2024-09-19 NOTE — Progress Notes (Signed)
 Patient presents for Annual.  LMP: No LMP recorded. Patient has had an injection.  Last pap: 03/11/2023 Contraception: Depo pt discontinued depo is October  Mammogram: Not yet indicated STD Screening: Accepts Flu Vaccine : Declines  CC:   Fun Fact: no period since she's discontinued her depo.

## 2024-09-19 NOTE — Progress Notes (Signed)
 ANNUAL GYNECOLOGY VISIT Chief Complaint  Patient presents with   Gynecologic Exam    Annual      Subjective:  Carla [name] is a 33 y.o. [id number] who presents for annual exam and pelvic pain.  Reports that she was seen in the ER two weeks ago for pelvic pain. Points to just under her umbilicus. Describes pain as constant. She had a CT that was unremarkable.  Gyn History: Patient's last menstrual period was 04/11/2024 (approximate). Sexually active: yes/no: Yes Contraception: no method History of STIs: No Last pap:  Lab Results  Component Value Date   DIAGPAP (A) 03/11/2023    - Atypical squamous cells of undetermined significance (ASC-US )   HPVHIGH Negative 03/11/2023  History of abnormal pap: Yes: see above, no other abnormals Periods: no period since discontinuing depo, last depo was 05/2024   The pregnancy intention screening data noted above was reviewed. Potential methods of contraception were discussed. The patient elected to proceed with No data recorded.       09/19/2024    8:38 AM 08/08/2024   10:01 AM 01/15/2024   10:44 AM 03/11/2023    1:29 PM 04/18/2021    8:30 AM  Depression screen PHQ 2/9  Decreased Interest 0 1 0 0 1  Down, Depressed, Hopeless 0 0 0 0 0  PHQ - 2 Score 0 1 0 0 1  Altered sleeping 0 2  1 3   Tired, decreased energy 3 3  3 2   Change in appetite 0 2  2 0  Feeling bad or failure about yourself  0 0  0 0  Trouble concentrating 0 1  3 1   Moving slowly or fidgety/restless 1 1  1  0  Suicidal thoughts 0 0  0 0  PHQ-9 Score 4 10  10 7   Difficult doing work/chores  Somewhat difficult           09/19/2024    8:38 AM 08/08/2024   10:01 AM 03/11/2023    1:29 PM 04/18/2021    8:31 AM  GAD 7 : Generalized Anxiety Score  Nervous, Anxious, on Edge 2 2 3 1   Control/stop worrying 2 1 2  0  Worry too much - different things 2 2 3 1   Trouble relaxing 1 2 3 1   Restless 1 2 2 1   Easily annoyed or irritable 2 1 2 1   Afraid - awful might happen  0 0 0 0  Total GAD 7 Score 10 10 15 5   Anxiety Difficulty  Somewhat difficult        OB History     Gravida  5   Para  3   Term  3   Preterm      AB  2   Living  3      SAB  1   IAB  1   Ectopic      Multiple  0   Live Births  3           Past Medical History:  Diagnosis Date   ADD (attention deficit disorder)    Anemia    Asthma    Bronchitis    Eczema    Heart palpitations    Hyperthyroidism    Insomnia    Migraine    Recurrent upper respiratory infection (URI)    Urticaria     Past Surgical History:  Procedure Laterality Date   ADENOIDECTOMY     NASAL SEPTOPLASTY W/ TURBINOPLASTY  2017   SINOSCOPY  TONSILLECTOMY     VAGINA RECONSTRUCTION SURGERY N/A     Social History   Socioeconomic History   Marital status: Single    Spouse name: Not on file   Number of children: 2   Years of education: Not on file   Highest education level: Some college, no degree  Occupational History   Not on file  Tobacco Use   Smoking status: Former    Current packs/day: 0.00    Average packs/day: 0.3 packs/day for 4.0 years (1.0 ttl pk-yrs)    Types: Cigarettes    Start date: 08/18/2014    Quit date: 08/18/2018    Years since quitting: 6.0   Smokeless tobacco: Never   Tobacco comments:    patient quit 2 weeks ago. 09-01-18  Vaping Use   Vaping status: Never Used  Substance and Sexual Activity   Alcohol use: Never   Drug use: Never   Sexual activity: Yes    Birth control/protection: I.U.D.  Other Topics Concern   Not on file  Social History Narrative   Not on file   Social Drivers of Health   Financial Resource Strain: Not on file  Food Insecurity: No Food Insecurity (06/19/2022)   Hunger Vital Sign    Worried About Running Out of Food in the Last Year: Never true    Ran Out of Food in the Last Year: Never true  Transportation Needs: No Transportation Needs (06/19/2022)   PRAPARE - Administrator, Civil Service (Medical): No     Lack of Transportation (Non-Medical): No  Physical Activity: Not on file  Stress: Not on file  Social Connections: Not on file    Family History  Problem Relation Age of Onset   Hypertension Mother    Migraines Mother    Hypertension Father    Diabetes Father    Migraines Father    Diabetes Brother    Migraines Brother    Diabetes Maternal Grandmother    Stroke Maternal Grandmother    Migraines Maternal Grandmother    Diabetes Paternal Grandfather    Heart disease Neg Hx     Current Outpatient Medications on File Prior to Visit  Medication Sig Dispense Refill   albuterol  (VENTOLIN  HFA) 108 (90 Base) MCG/ACT inhaler Inhale 2 puffs into the lungs every 6 (six) hours as needed for wheezing or shortness of breath. 8 g 2   amphetamine-dextroamphetamine (ADDERALL XR) 20 MG 24 hr capsule Take 20 mg by mouth daily.     Butalbital -APAP-Caffeine  50-325-40 MG capsule Take 1-2 capsules by mouth every 6 (six) hours as needed for headache. 30 capsule 0   cyclobenzaprine  (FLEXERIL ) 10 MG tablet Take 10 mg by mouth 3 (three) times daily as needed for muscle spasms.     fluticasone  (FLONASE ) 50 MCG/ACT nasal spray Place 2 sprays into both nostrils daily. 48 mL 0   medroxyPROGESTERone  Acetate 150 MG/ML SUSY Inject 1 mL (150 mg total) into the muscle every 3 (three) months. (Patient not taking: Reported on 09/19/2024) 1 mL 0   [DISCONTINUED] medroxyPROGESTERone  (DEPO-PROVERA ) 150 MG/ML injection Inject 1 mL (150 mg total) into the muscle every 3 (three) months. 1 mL 0   No current facility-administered medications on file prior to visit.    Allergies  Allergen Reactions   Latex Hives     Objective:   Vitals:   09/19/24 0833  BP: 116/79  Pulse: 83  Weight: 155 lb 1.3 oz (70.3 kg)  Height: 5' 9.34 (1.761 m)   Physical  Examination:   General appearance - well appearing, and in no distress  Mental status - alert, oriented to person, place, and time  Psych:  normal mood and  affect  Breasts - breasts appear normal, no suspicious masses, no skin or nipple changes or  axillary nodes  Abdomen - soft, nontender, nondistended, no masses or organomegaly  Pelvic -  VULVA: normal appearing vulva with no masses, tenderness or lesions   VAGINA: normal appearing vagina with normal color and discharge, no lesions   CERVIX: normal appearing cervix without discharge or lesions, +cervical motion tenderness  Thin prep pap is done with HR HPV cotesting  UTERUS: uterus is felt to be normal size, shape, consistency, mildly tender to palpation  ADNEXA: right >left adnexal tenderness, no masses  Extremities:  No swelling or varicosities noted  Chaperone present for exam  Assessment and Plan:  1. Well woman exam with routine gynecological exam (Primary) Pap Normal clinical breast exam, mammograms to start at age 30   2. Cervical cancer screening - Cytology - PAP  3. Routine screening for STI (sexually transmitted infection) - RPR+HBsAg+HCVAb+... - Cervicovaginal ancillary only( [place])  4. Anxiety Declines BH referral  5. Pelvic pain UPT and UA neg. +cervical motion and adnexal tenderness. Recommend treatment for PID and pelvic ultrasound to r/o other abnormality, though less likely with normal CT. Return in 1 month for re-evaluation of symptoms - Cervicovaginal ancillary only( [place]) - US  PELVIC COMPLETE WITH TRANSVAGINAL; Future - Rocephin 500 mg x 1 - doxycycline (VIBRAMYCIN) 100 MG capsule; Take 1 capsule (100 mg total) by mouth 2 (two) times daily for 14 days.  Dispense: 28 capsule; Refill: 0 - metroNIDAZOLE (FLAGYL) 500 MG tablet; Take 1 tablet (500 mg total) by mouth 2 (two) times daily for 14 days.  Dispense: 28 tablet; Refill: 0     Return in about 1 month (around 10/20/2024).  No future appointments.  Rollo ONEIDA Bring, MD, FACOG Obstetrician & Gynecologist, [facility] for [facility], [facility]

## 2024-09-20 LAB — CERVICOVAGINAL ANCILLARY ONLY
Bacterial Vaginitis (gardnerella): NEGATIVE
Candida Glabrata: NEGATIVE
Candida Vaginitis: NEGATIVE
Chlamydia: NEGATIVE
Comment: NEGATIVE
Comment: NEGATIVE
Comment: NEGATIVE
Comment: NEGATIVE
Comment: NEGATIVE
Comment: NORMAL
Neisseria Gonorrhea: NEGATIVE
Trichomonas: NEGATIVE

## 2024-09-21 LAB — CYTOLOGY - PAP
Comment: NEGATIVE
Diagnosis: NEGATIVE
High risk HPV: NEGATIVE

## 2024-09-22 ENCOUNTER — Ambulatory Visit: Payer: Self-pay | Admitting: Obstetrics and Gynecology

## 2024-09-23 ENCOUNTER — Telehealth: Admitting: Obstetrics and Gynecology

## 2024-09-27 ENCOUNTER — Encounter: Payer: Self-pay | Admitting: Gastroenterology

## 2024-10-05 ENCOUNTER — Telehealth: Payer: Self-pay | Admitting: Physician Assistant

## 2024-10-05 MED ORDER — AMPHETAMINE-DEXTROAMPHET ER 20 MG PO CP24: 20.0000 mg | ORAL_CAPSULE | Freq: Every day | ORAL | 0 refills | Status: DC

## 2024-10-05 NOTE — Telephone Encounter (Signed)
 Pt called about a med refill of a medicine prescribed outside of our office by behavioral health. The behavioral health office closed and she is about to run out and needs more now. She was very upset about having to wait until our next available toc appt. And wanted to let us  know that it not good practice of us  to make her wait that long. She stated that a behavior health office in [place] told her if she could do a urine sample and test her vitals here and send the results to them that they would prescribe the adderall for her. Is this something we would be able to do? Sending to dod to advise

## 2024-10-05 NOTE — Addendum Note (Signed)
 Addended by: WATT RAISIN C on: 10/05/2024 08:53 PM   Modules accepted: Orders

## 2024-10-17 ENCOUNTER — Telehealth: Admitting: Obstetrics and Gynecology

## 2024-10-21 ENCOUNTER — Telehealth: Payer: Self-pay

## 2024-10-21 NOTE — Telephone Encounter (Signed)
 Pharmacy Patient Advocate Encounter   Received notification from CoverMyMeds that prior authorization for Aimovig  70MG /ML auto-injectors is due for renewal.   Insurance verification completed.   The patient is insured through [employer].  Action: Patient hasn't been seen in your office in over a year. Plan requires updated chart notes for PA renewal.

## 2024-11-04 ENCOUNTER — Ambulatory Visit: Payer: Self-pay

## 2024-11-04 NOTE — Telephone Encounter (Signed)
 Pt states she is unable to tolerate generic amphetamine -dextroamphetamine, it makes her ill, requesting brand name Adderall please.

## 2024-11-04 NOTE — Addendum Note (Signed)
 Addended by: Christl Fessenden D on: 11/04/2024 04:44 PM   Modules accepted: Orders

## 2024-11-04 NOTE — Telephone Encounter (Signed)
 FYI Only or Action Required?: FYI only for provider: Adderall refill.  Patient was last seen in primary care on 08/08/2024 by Almarie Waddell NOVAK, NP.  Called Nurse Triage reporting Medication Refill.  Symptoms began na.  Interventions attempted: Other: na.  Symptoms are: gradually worsening.  Triage Disposition: Call PCP Now  Patient/caregiver understands and will follow disposition?: Yes - Called [facility] - pt needs a NP appt and will see if a provider will be able to refill medication for pt this afternoon. Call warm transferred.                    Copied from CRM #[id number]. Topic: Clinical - Red Word Triage >> Nov 04, 2024  3:43 PM Macario HERO wrote: Red Word that prompted transfer to Nurse Triage: Patient is frustrated that she has not receive her medication: amphetamine -dextroamphetamine (ADDERALL XR) 20 MG 24 hr capsule [[id number]] - She said that she is failing school and needs her medication today. >> Nov 04, 2024  4:06 PM Delon T wrote: Patient calling back after call was disconnected. Reason for Disposition  [1] Prescription refill request for ESSENTIAL medicine (i.e., likelihood of harm to patient if not taken) AND [2] triager unable to refill per department policy  Answer Assessment - Initial Assessment Questions 1. DRUG NAME: What medicine do you need to have refilled?     Adderall 2. REFILLS REMAINING: How many refills are remaining? Notes: The label on the medicine or pill bottle will show how many refills are remaining. If there are no refills remaining, then a renewal may be needed.     none  Protocols used: Medication Refill and Renewal Call-A-AH

## 2024-11-06 MED ORDER — AMPHETAMINE-DEXTROAMPHET ER 20 MG PO CP24: 20.0000 mg | ORAL_CAPSULE | Freq: Every day | ORAL | 0 refills | Status: DC

## 2024-11-10 ENCOUNTER — Ambulatory Visit: Admitting: Gastroenterology

## 2024-11-22 NOTE — Progress Notes (Deleted)
 "  Subjective:     Patient ID: Carla [name], female    DOB: [birth year], 33 y.o.   MRN: [id number]  No chief complaint on file.   HPI  Discussed the use of AI scribe software for clinical note transcription with the patient, who gave verbal consent to proceed.  History of Present Illness              Health Maintenance Due  Topic Date Due   Pneumococcal Vaccine (1 of 2 - PCV) Never done   Hepatitis B Vaccines 19-59 Average Risk (1 of 3 - 19+ 3-dose series) Never done   HPV VACCINES (1 - 3-dose SCDM series) Never done   Influenza Vaccine  Never done   COVID-19 Vaccine (3 - 2025-26 season) 07/25/2024    Past Medical History:  Diagnosis Date   ADD (attention deficit disorder)    Anemia    Asthma    Bronchitis    Eczema    Heart palpitations    Hyperthyroidism    Insomnia    Migraine    Recurrent upper respiratory infection (URI)    Urticaria     Past Surgical History:  Procedure Laterality Date   ADENOIDECTOMY     NASAL SEPTOPLASTY W/ TURBINOPLASTY  2017   SINOSCOPY     TONSILLECTOMY     VAGINA RECONSTRUCTION SURGERY N/A     Family History  Problem Relation Age of Onset   Hypertension Mother    Migraines Mother    Hypertension Father    Diabetes Father    Migraines Father    Diabetes Brother    Migraines Brother    Diabetes Maternal Grandmother    Stroke Maternal Grandmother    Migraines Maternal Grandmother    Diabetes Paternal Grandfather    Heart disease Neg Hx     Social History   Socioeconomic History   Marital status: Single    Spouse name: Not on file   Number of children: 2   Years of education: Not on file   Highest education level: Some college, no degree  Occupational History   Not on file  Tobacco Use   Smoking status: Former    Current packs/day: 0.00    Average packs/day: 0.3 packs/day for 4.0 years (1.0 ttl pk-yrs)    Types: Cigarettes    Start date: 08/18/2014    Quit date: 08/18/2018    Years since quitting: 6.2    Smokeless tobacco: Never   Tobacco comments:    patient quit 2 weeks ago. 09-01-18  Vaping Use   Vaping status: Never Used  Substance and Sexual Activity   Alcohol use: Never   Drug use: Never   Sexual activity: Yes    Birth control/protection: I.U.D.  Other Topics Concern   Not on file  Social History Narrative   Not on file   Social Drivers of Health   Tobacco Use: Medium Risk (09/08/2024)   Received from [facility]   Patient History    Smoking Tobacco Use: Former    Smokeless Tobacco Use: Never    Passive Exposure: Not on file  Financial Resource Strain: Not on file  Food Insecurity: No Food Insecurity (06/19/2022)   Hunger Vital Sign    Worried About Running Out of Food in the Last Year: Never true    Ran Out of Food in the Last Year: Never true  Transportation Needs: No Transportation Needs (06/19/2022)   PRAPARE - Administrator, Civil Service (Medical): No  Lack of Transportation (Non-Medical): No  Physical Activity: Not on file  Stress: Not on file  Social Connections: Not on file  Intimate Partner Violence: Not on file  Depression [phone]): Low Risk (09/19/2024)   Depression (PHQ2-9)    PHQ-2 Score: 4  Recent Concern: Depression (PHQ2-9) - Medium Risk (08/08/2024)   Depression (PHQ2-9)    PHQ-2 Score: 10  Alcohol Screen: Not on file  Housing: Low Risk (09/09/2024)   Received from [facility]   Epic    What is your living situation today?: I have a steady place to live    Think about the place you live. Do you have problems with any of the following? Choose all that apply:: Not on file  Utilities: Not on file  Health Literacy: Not on file    Outpatient Medications Prior to Visit  Medication Sig Dispense Refill   albuterol  (VENTOLIN  HFA) 108 (90 Base) MCG/ACT inhaler Inhale 2 puffs into the lungs every 6 (six) hours as needed for wheezing or shortness of breath. 8 g 2   amphetamine -dextroamphetamine (ADDERALL XR) 20 MG 24 hr capsule Take  1 capsule (20 mg total) by mouth daily. 30 capsule 0   Butalbital -APAP-Caffeine  50-325-40 MG capsule Take 1-2 capsules by mouth every 6 (six) hours as needed for headache. 30 capsule 0   cyclobenzaprine  (FLEXERIL ) 10 MG tablet Take 10 mg by mouth 3 (three) times daily as needed for muscle spasms.     fluticasone  (FLONASE ) 50 MCG/ACT nasal spray Place 2 sprays into both nostrils daily. 48 mL 0   medroxyPROGESTERone  Acetate 150 MG/ML SUSY Inject 1 mL (150 mg total) into the muscle every 3 (three) months. (Patient not taking: Reported on 09/23/2024) 1 mL 0   No facility-administered medications prior to visit.    Allergies[1]  ROS     Objective:    Physical Exam   There were no vitals taken for this visit. Wt Readings from Last 3 Encounters:  09/19/24 155 lb 1.3 oz (70.3 kg)  08/08/24 157 lb (71.2 kg)  06/08/24 158 lb 0.6 oz (71.7 kg)       Assessment & Plan:   Problem List Items Addressed This Visit   None   I am having Carla Scarce Dones maintain her Butalbital -APAP-Caffeine , albuterol , cyclobenzaprine , fluticasone , medroxyPROGESTERone  Acetate, and amphetamine -dextroamphetamine.  No orders of the defined types were placed in this encounter.     [1]  Allergies Allergen Reactions   Latex Hives   "

## 2024-11-25 ENCOUNTER — Ambulatory Visit: Admitting: Medical

## 2024-11-25 ENCOUNTER — Ambulatory Visit: Admitting: Student

## 2024-11-25 DIAGNOSIS — J45909 Unspecified asthma, uncomplicated: Secondary | ICD-10-CM

## 2024-11-25 DIAGNOSIS — R7303 Prediabetes: Secondary | ICD-10-CM

## 2024-12-01 ENCOUNTER — Ambulatory Visit: Admitting: Student

## 2024-12-20 NOTE — Progress Notes (Signed)
 "  Subjective:   Error-Pt no call no show, keeping note incase RTC    Patient ID: Carla [name], female    DOB: 24-Feb-1991, 34 y.o.   MRN: [id number]  No chief complaint on file.   HPI  Discussed the use of AI scribe software for clinical note transcription with the patient, who gave verbal consent to proceed.  History of Present Illness          PMHX- Asthma, Anemia, Migraines, Thyroid  issues, ADD  Follows with: Neurology  ADD- Adderoll XR 20 mg Tolerating current medications without issues. Denies new symptoms or concerns. Reports stable mood and appetite.   Migraine- current tx?   Patient denies fever, chills, SOB, CP, palpitations, dyspnea, edema, HA, vision changes, N/V/D, abdominal pain, urinary symptoms, rash, weight changes, and recent illness or hospitalizations.   Health Maintenance Due  Topic Date Due   Pneumococcal Vaccine (1 of 2 - PCV) Never done   Hepatitis B Vaccines 19-59 Average Risk (1 of 3 - 19+ 3-dose series) Never done   Influenza Vaccine  Never done   COVID-19 Vaccine (3 - 2025-26 season) 07/25/2024    Past Medical History:  Diagnosis Date   ADD (attention deficit disorder)    Anemia    Asthma    Bronchitis    Eczema    Heart palpitations    Hyperthyroidism    Insomnia    Migraine    Recurrent upper respiratory infection (URI)    Urticaria     Past Surgical History:  Procedure Laterality Date   ADENOIDECTOMY     NASAL SEPTOPLASTY W/ TURBINOPLASTY  2017   SINOSCOPY     TONSILLECTOMY     VAGINA RECONSTRUCTION SURGERY N/A     Family History  Problem Relation Age of Onset   Hypertension Mother    Migraines Mother    Hypertension Father    Diabetes Father    Migraines Father    Diabetes Brother    Migraines Brother    Diabetes Maternal Grandmother    Stroke Maternal Grandmother    Migraines Maternal Grandmother    Diabetes Paternal Grandfather    Heart disease Neg Hx     Social History   Socioeconomic History    Marital status: Single    Spouse name: Not on file   Number of children: 2   Years of education: Not on file   Highest education level: Some college, no degree  Occupational History   Not on file  Tobacco Use   Smoking status: Former    Current packs/day: 0.00    Average packs/day: 0.3 packs/day for 4.0 years (1.0 ttl pk-yrs)    Types: Cigarettes    Start date: 08/18/2014    Quit date: 08/18/2018    Years since quitting: 6.3   Smokeless tobacco: Never   Tobacco comments:    patient quit 2 weeks ago. 09-01-18  Vaping Use   Vaping status: Never Used  Substance and Sexual Activity   Alcohol use: Never   Drug use: Never   Sexual activity: Yes    Birth control/protection: I.U.D.  Other Topics Concern   Not on file  Social History Narrative   Not on file   Social Drivers of Health   Tobacco Use: Medium Risk (09/08/2024)   Received from [facility]   Patient History    Smoking Tobacco Use: Former    Smokeless Tobacco Use: Never    Passive Exposure: Not on file  Financial Resource Strain: Not on  file  Food Insecurity: No Food Insecurity (06/19/2022)   Hunger Vital Sign    Worried About Running Out of Food in the Last Year: Never true    Ran Out of Food in the Last Year: Never true  Transportation Needs: No Transportation Needs (06/19/2022)   PRAPARE - Administrator, Civil Service (Medical): No    Lack of Transportation (Non-Medical): No  Physical Activity: Not on file  Stress: Not on file  Social Connections: Not on file  Intimate Partner Violence: Not on file  Depression [phone]): Low Risk (09/19/2024)   Depression (PHQ2-9)    PHQ-2 Score: 4  Recent Concern: Depression (PHQ2-9) - Medium Risk (08/08/2024)   Depression (PHQ2-9)    PHQ-2 Score: 10  Alcohol Screen: Not on file  Housing: Low Risk (09/09/2024)   Received from [facility]   Epic    What is your living situation today?: I have a steady place to live    Think about the place you live. Do you  have problems with any of the following? Choose all that apply:: Not on file  Utilities: Not on file  Health Literacy: Not on file    Outpatient Medications Prior to Visit  Medication Sig Dispense Refill   albuterol  (VENTOLIN  HFA) 108 (90 Base) MCG/ACT inhaler Inhale 2 puffs into the lungs every 6 (six) hours as needed for wheezing or shortness of breath. 8 g 2   amphetamine -dextroamphetamine (ADDERALL  XR) 20 MG 24 hr capsule Take 1 capsule (20 mg total) by mouth daily. 30 capsule 0   Butalbital -APAP-Caffeine  50-325-40 MG capsule Take 1-2 capsules by mouth every 6 (six) hours as needed for headache. 30 capsule 0   cyclobenzaprine  (FLEXERIL ) 10 MG tablet Take 10 mg by mouth 3 (three) times daily as needed for muscle spasms.     fluticasone  (FLONASE ) 50 MCG/ACT nasal spray Place 2 sprays into both nostrils daily. 48 mL 0   medroxyPROGESTERone  Acetate 150 MG/ML SUSY Inject 1 mL (150 mg total) into the muscle every 3 (three) months. (Patient not taking: Reported on 09/23/2024) 1 mL 0   No facility-administered medications prior to visit.    Allergies[1]  ROS     Objective:    Physical Exam   There were no vitals taken for this visit. 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 Increase exercise as tolerated. Continue current meds        I am having Aliene Scarce Dones maintain her Butalbital -APAP-Caffeine , albuterol , cyclobenzaprine , fluticasone , medroxyPROGESTERone  Acetate, and amphetamine -dextroamphetamine.  No orders of the defined types were placed in this  encounter.      [1]  Allergies Allergen Reactions   Latex Hives   "

## 2024-12-21 NOTE — Progress Notes (Unsigned)
 "  Chief Complaint: Primary GI MD:  HPI:  *** is a  ***  who was referred to me by No ref. provider found for a complaint of *** .     Discussed the use of AI scribe software for clinical note transcription with the patient, who gave verbal consent to proceed.  History of Present Illness      PREVIOUS GI WORKUP   CTAP w contrast 08/2024 with contracted gallbladder, otherwise normal.  Past Medical History:  Diagnosis Date   ADD (attention deficit disorder)    Anemia    Asthma    Bronchitis    Eczema    Heart palpitations    Hyperthyroidism    Insomnia    Migraine    Recurrent upper respiratory infection (URI)    Urticaria     Past Surgical History:  Procedure Laterality Date   ADENOIDECTOMY     NASAL SEPTOPLASTY W/ TURBINOPLASTY  2017   SINOSCOPY     TONSILLECTOMY     VAGINA RECONSTRUCTION SURGERY N/A     Current Outpatient Medications  Medication Sig Dispense Refill   albuterol  (VENTOLIN  HFA) 108 (90 Base) MCG/ACT inhaler Inhale 2 puffs into the lungs every 6 (six) hours as needed for wheezing or shortness of breath. 8 g 2   amphetamine -dextroamphetamine (ADDERALL XR) 20 MG 24 hr capsule Take 1 capsule (20 mg total) by mouth daily. 30 capsule 0   Butalbital -APAP-Caffeine  50-325-40 MG capsule Take 1-2 capsules by mouth every 6 (six) hours as needed for headache. 30 capsule 0   cyclobenzaprine  (FLEXERIL ) 10 MG tablet Take 10 mg by mouth 3 (three) times daily as needed for muscle spasms.     fluticasone  (FLONASE ) 50 MCG/ACT nasal spray Place 2 sprays into both nostrils daily. 48 mL 0   medroxyPROGESTERone  Acetate 150 MG/ML SUSY Inject 1 mL (150 mg total) into the muscle every 3 (three) months. (Patient not taking: Reported on 09/23/2024) 1 mL 0   No current facility-administered medications for this visit.    Allergies as of 12/22/2024 - Review Complete 09/23/2024  Allergen Reaction Noted   Latex Hives 09/01/2018    Family History  Problem Relation Age of  Onset   Hypertension Mother    Migraines Mother    Hypertension Father    Diabetes Father    Migraines Father    Diabetes Brother    Migraines Brother    Diabetes Maternal Grandmother    Stroke Maternal Grandmother    Migraines Maternal Grandmother    Diabetes Paternal Grandfather    Heart disease Neg Hx     Social History   Socioeconomic History   Marital status: Single    Spouse name: Not on file   Number of children: 2   Years of education: Not on file   Highest education level: Some college, no degree  Occupational History   Not on file  Tobacco Use   Smoking status: Former    Current packs/day: 0.00    Average packs/day: 0.3 packs/day for 4.0 years (1.0 ttl pk-yrs)    Types: Cigarettes    Start date: 08/18/2014    Quit date: 08/18/2018    Years since quitting: 6.3   Smokeless tobacco: Never   Tobacco comments:    patient quit 2 weeks ago. 09-01-18  Vaping Use   Vaping status: Never Used  Substance and Sexual Activity   Alcohol use: Never   Drug use: Never   Sexual activity: Yes    Birth control/protection: I.U.D.  Other Topics Concern   Not on file  Social History Narrative   Not on file   Social Drivers of Health   Tobacco Use: Medium Risk (09/08/2024)   Received from [facility]   Patient History    Smoking Tobacco Use: Former    Smokeless Tobacco Use: Never    Passive Exposure: Not on file  Financial Resource Strain: Not on file  Food Insecurity: No Food Insecurity (06/19/2022)   Hunger Vital Sign    Worried About Running Out of Food in the Last Year: Never true    Ran Out of Food in the Last Year: Never true  Transportation Needs: No Transportation Needs (06/19/2022)   PRAPARE - Administrator, Civil Service (Medical): No    Lack of Transportation (Non-Medical): No  Physical Activity: Not on file  Stress: Not on file  Social Connections: Not on file  Intimate Partner Violence: Not on file  Depression [phone]): Low Risk (09/19/2024)    Depression (PHQ2-9)    PHQ-2 Score: 4  Recent Concern: Depression (PHQ2-9) - Medium Risk (08/08/2024)   Depression (PHQ2-9)    PHQ-2 Score: 10  Alcohol Screen: Not on file  Housing: Low Risk (09/09/2024)   Received from [facility]   Epic    What is your living situation today?: I have a steady place to live    Think about the place you live. Do you have problems with any of the following? Choose all that apply:: Not on file  Utilities: Not on file  Health Literacy: Not on file    Review of Systems:    Constitutional: No weight loss, fever, chills, weakness or fatigue HEENT: Eyes: No change in vision               Ears, Nose, Throat:  No change in hearing or congestion Skin: No rash or itching Cardiovascular: No chest pain, chest pressure or palpitations   Respiratory: No SOB or cough Gastrointestinal: See HPI and otherwise negative Genitourinary: No dysuria or change in urinary frequency Neurological: No headache, dizziness or syncope Musculoskeletal: No new muscle or joint pain Hematologic: No bleeding or bruising Psychiatric: No history of depression or anxiety    Physical Exam:  Vital signs: There were no vitals taken for this visit.  Constitutional: NAD, alert and cooperative Head:  Normocephalic and atraumatic. Eyes:   PEERL, EOMI. No icterus. Conjunctiva pink. Respiratory: Respirations even and unlabored. Lungs clear to auscultation bilaterally.   No wheezes, crackles, or rhonchi.  Cardiovascular:  Regular rate and rhythm. No peripheral edema, cyanosis or pallor.  Gastrointestinal:  Soft, nondistended, nontender. No rebound or guarding. Normal bowel sounds. No appreciable masses or hepatomegaly. Rectal:  Declines Msk:  Symmetrical without gross deformities. Without edema, no deformity or joint abnormality.  Neurologic:  Alert and  oriented x4;  grossly normal neurologically.  Skin:   Dry and intact without significant lesions or rashes. Psychiatric: Oriented to  person, place and time. Demonstrates good judgement and reason without abnormal affect or behaviors.  Physical Exam    RELEVANT LABS AND IMAGING: CBC    Component Value Date/Time   WBC 3.4 (L) 08/08/2024 1020   RBC 4.44 08/08/2024 1020   HGB 13.0 08/08/2024 1020   HGB 10.3 (L) 04/18/2021 0822   HCT 39.3 08/08/2024 1020   HCT 30.8 (L) 04/18/2021 0822   PLT 238.0 08/08/2024 1020   PLT 264 04/18/2021 0822   MCV 88.5 08/08/2024 1020   MCV 89 04/18/2021 9177  MCH 27.0 07/04/2021 1021   MCHC 33.2 08/08/2024 1020   RDW 13.1 08/08/2024 1020   RDW 12.2 04/18/2021 0822   LYMPHSABS 1.4 08/08/2024 1020   LYMPHSABS 1.9 03/11/2021 0853   MONOABS 0.3 08/08/2024 1020   EOSABS 0.1 08/08/2024 1020   EOSABS 0.1 03/11/2021 0853   BASOSABS 0.0 08/08/2024 1020   BASOSABS 0.0 03/11/2021 0853    CMP     Component Value Date/Time   NA 140 08/08/2024 1020   NA 141 06/19/2022 1407   K 4.6 08/15/2024 0932   CL 105 08/08/2024 1020   CO2 28 08/08/2024 1020   GLUCOSE 91 08/08/2024 1020   BUN 6 08/08/2024 1020   BUN 7 06/19/2022 1407   CREATININE 0.71 08/08/2024 1020   CALCIUM 9.6 08/08/2024 1020   PROT 6.9 08/08/2024 1020   PROT 6.0 01/22/2021 0926   ALBUMIN 4.5 08/08/2024 1020   ALBUMIN 3.9 01/22/2021 0926   AST 13 08/08/2024 1020   ALT 11 08/08/2024 1020   ALKPHOS 53 08/08/2024 1020   BILITOT 0.3 08/08/2024 1020   BILITOT <0.2 01/22/2021 0926   GFRNONAA >60 07/04/2021 1021     Assessment/Plan:   Epigastric pain CTAP W contrast 08/2024 with contracted gallbladder, otherwise normal. Negative CBC/CMP/Lipase      Edessa Jakubowicz Mollie RIGGERS [facility] 12/21/2024, 9:50 AM  Cc: No ref. provider found "

## 2024-12-22 ENCOUNTER — Ambulatory Visit: Admitting: Gastroenterology

## 2024-12-22 NOTE — Assessment & Plan Note (Signed)
 Stable on current medications. UDS and contract- FU 3 months

## 2024-12-22 NOTE — Assessment & Plan Note (Signed)
 Last hgba1c acceptable, minimize simple carbs. Increase exercise as tolerated. Continue current meds

## 2024-12-22 NOTE — Assessment & Plan Note (Signed)
 Pt follows with neuro, she will continue care there. Currently manages with fioricet prn and prn cyclobenzaprine. Reports infrequent use of triptan and no regular daily preventative medication

## 2024-12-23 ENCOUNTER — Ambulatory Visit (INDEPENDENT_AMBULATORY_CARE_PROVIDER_SITE_OTHER): Admitting: Student

## 2024-12-23 ENCOUNTER — Encounter: Payer: Self-pay | Admitting: Student

## 2024-12-23 DIAGNOSIS — Z91199 Patient's noncompliance with other medical treatment and regimen due to unspecified reason: Secondary | ICD-10-CM

## 2024-12-23 DIAGNOSIS — R41840 Attention and concentration deficit: Secondary | ICD-10-CM

## 2024-12-23 DIAGNOSIS — J45909 Unspecified asthma, uncomplicated: Secondary | ICD-10-CM

## 2024-12-23 DIAGNOSIS — R7303 Prediabetes: Secondary | ICD-10-CM

## 2024-12-23 DIAGNOSIS — G43709 Chronic migraine without aura, not intractable, without status migrainosus: Secondary | ICD-10-CM

## 2024-12-28 ENCOUNTER — Ambulatory Visit (HOSPITAL_BASED_OUTPATIENT_CLINIC_OR_DEPARTMENT_OTHER): Admitting: Family Medicine

## 2024-12-28 ENCOUNTER — Encounter (HOSPITAL_BASED_OUTPATIENT_CLINIC_OR_DEPARTMENT_OTHER): Payer: Self-pay | Admitting: Family Medicine

## 2024-12-28 VITALS — BP 99/69 | HR 86 | Ht 69.34 in | Wt 149.0 lb

## 2024-12-28 DIAGNOSIS — F32A Depression, unspecified: Secondary | ICD-10-CM

## 2024-12-28 DIAGNOSIS — R41840 Attention and concentration deficit: Secondary | ICD-10-CM | POA: Diagnosis not present

## 2024-12-28 DIAGNOSIS — Z7689 Persons encountering health services in other specified circumstances: Secondary | ICD-10-CM

## 2024-12-28 DIAGNOSIS — G43709 Chronic migraine without aura, not intractable, without status migrainosus: Secondary | ICD-10-CM | POA: Diagnosis not present

## 2024-12-28 DIAGNOSIS — R102 Pelvic and perineal pain unspecified side: Secondary | ICD-10-CM

## 2024-12-28 DIAGNOSIS — F419 Anxiety disorder, unspecified: Secondary | ICD-10-CM

## 2024-12-28 LAB — POCT URINE DRUG SCREEN
POC Amphetamine UR: NOT DETECTED
POC Cocaine UR: NOT DETECTED
POC Marijuana UR: NOT DETECTED
POC Methamphetamine UR: NOT DETECTED
POC Opiate Ur: NOT DETECTED

## 2024-12-28 MED ORDER — BUTALBITAL-APAP-CAFFEINE 50-325-40 MG PO CAPS: 1.0000 | ORAL_CAPSULE | Freq: Four times a day (QID) | ORAL | 0 refills | Status: AC | PRN

## 2024-12-28 MED ORDER — CYCLOBENZAPRINE HCL 10 MG PO TABS: 10.0000 mg | ORAL_TABLET | Freq: Three times a day (TID) | ORAL | 0 refills | Status: AC | PRN

## 2024-12-28 MED ORDER — ADDERALL XR 25 MG PO CP24: 25.0000 mg | ORAL_CAPSULE | ORAL | 0 refills | Status: AC

## 2024-12-28 NOTE — Patient Instructions (Signed)
 Call OBGYN for recommendation of Sonohistogram for ongoing pelvic pain   Call Neurology to discuss options for migraines (Botox injections)
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
## 2025-01-04 ENCOUNTER — Ambulatory Visit: Admitting: Allergy and Immunology

## 2025-01-17 ENCOUNTER — Ambulatory Visit: Admitting: Gastroenterology

## 2025-02-28 ENCOUNTER — Encounter (HOSPITAL_BASED_OUTPATIENT_CLINIC_OR_DEPARTMENT_OTHER): Admitting: Family Medicine
